# Patient Record
Sex: Female | Born: 1955 | Hispanic: No | Smoking: Former smoker
Health system: Southern US, Community
[De-identification: ages and names within clinical notes are randomized; demographics above are authoritative.]

## PROBLEM LIST (undated history)

## (undated) DIAGNOSIS — Z9889 Other specified postprocedural states: Secondary | ICD-10-CM

## (undated) DIAGNOSIS — H409 Unspecified glaucoma: Secondary | ICD-10-CM

## (undated) DIAGNOSIS — R112 Nausea with vomiting, unspecified: Secondary | ICD-10-CM

## (undated) DIAGNOSIS — I10 Essential (primary) hypertension: Secondary | ICD-10-CM

## (undated) DIAGNOSIS — E119 Type 2 diabetes mellitus without complications: Secondary | ICD-10-CM

## (undated) DIAGNOSIS — T8859XA Other complications of anesthesia, initial encounter: Secondary | ICD-10-CM

## (undated) DIAGNOSIS — C50919 Malignant neoplasm of unspecified site of unspecified female breast: Secondary | ICD-10-CM

## (undated) DIAGNOSIS — Z923 Personal history of irradiation: Secondary | ICD-10-CM

## (undated) HISTORY — PX: GLAUCOMA SURGERY: SHX656

---

## 1999-10-27 ENCOUNTER — Other Ambulatory Visit: Admission: RE | Admit: 1999-10-27 | Discharge: 1999-10-27 | Payer: Self-pay | Admitting: Family Medicine

## 2008-09-30 ENCOUNTER — Encounter: Admission: RE | Admit: 2008-09-30 | Discharge: 2008-09-30 | Payer: Self-pay | Admitting: Internal Medicine

## 2012-01-17 ENCOUNTER — Other Ambulatory Visit (HOSPITAL_COMMUNITY): Payer: Self-pay | Admitting: Nurse Practitioner

## 2012-01-17 DIAGNOSIS — Z139 Encounter for screening, unspecified: Secondary | ICD-10-CM

## 2012-01-20 ENCOUNTER — Ambulatory Visit (HOSPITAL_COMMUNITY)
Admission: RE | Admit: 2012-01-20 | Discharge: 2012-01-20 | Disposition: A | Discharge status: Home / Self Care | Payer: PRIVATE HEALTH INSURANCE | Source: Ambulatory Visit | Attending: Nurse Practitioner | Admitting: Nurse Practitioner

## 2012-01-20 DIAGNOSIS — Z139 Encounter for screening, unspecified: Secondary | ICD-10-CM

## 2012-01-20 DIAGNOSIS — Z1231 Encounter for screening mammogram for malignant neoplasm of breast: Secondary | ICD-10-CM | POA: Insufficient documentation

## 2016-03-18 ENCOUNTER — Emergency Department (HOSPITAL_COMMUNITY): Payer: PRIVATE HEALTH INSURANCE

## 2016-03-18 ENCOUNTER — Emergency Department (HOSPITAL_COMMUNITY)
Admission: EM | Admit: 2016-03-18 | Discharge: 2016-03-18 | Disposition: A | Discharge status: Home / Self Care | Payer: PRIVATE HEALTH INSURANCE | Attending: Emergency Medicine | Admitting: Emergency Medicine

## 2016-03-18 ENCOUNTER — Encounter (HOSPITAL_COMMUNITY): Payer: Self-pay

## 2016-03-18 DIAGNOSIS — X500XXA Overexertion from strenuous movement or load, initial encounter: Secondary | ICD-10-CM | POA: Insufficient documentation

## 2016-03-18 DIAGNOSIS — Z794 Long term (current) use of insulin: Secondary | ICD-10-CM | POA: Insufficient documentation

## 2016-03-18 DIAGNOSIS — Y990 Civilian activity done for income or pay: Secondary | ICD-10-CM | POA: Insufficient documentation

## 2016-03-18 DIAGNOSIS — R1031 Right lower quadrant pain: Secondary | ICD-10-CM | POA: Insufficient documentation

## 2016-03-18 DIAGNOSIS — M545 Low back pain, unspecified: Secondary | ICD-10-CM

## 2016-03-18 DIAGNOSIS — Y9389 Activity, other specified: Secondary | ICD-10-CM | POA: Insufficient documentation

## 2016-03-18 DIAGNOSIS — E119 Type 2 diabetes mellitus without complications: Secondary | ICD-10-CM | POA: Insufficient documentation

## 2016-03-18 DIAGNOSIS — Y929 Unspecified place or not applicable: Secondary | ICD-10-CM | POA: Insufficient documentation

## 2016-03-18 DIAGNOSIS — Z79899 Other long term (current) drug therapy: Secondary | ICD-10-CM | POA: Insufficient documentation

## 2016-03-18 DIAGNOSIS — Z7982 Long term (current) use of aspirin: Secondary | ICD-10-CM | POA: Insufficient documentation

## 2016-03-18 HISTORY — DX: Type 2 diabetes mellitus without complications: E11.9

## 2016-03-18 LAB — COMPREHENSIVE METABOLIC PANEL
ALBUMIN: 3.9 g/dL (ref 3.5–5.0)
ALT: 38 U/L (ref 14–54)
ANION GAP: 5 (ref 5–15)
AST: 21 U/L (ref 15–41)
Alkaline Phosphatase: 80 U/L (ref 38–126)
BUN: 14 mg/dL (ref 6–20)
CHLORIDE: 106 mmol/L (ref 101–111)
CO2: 27 mmol/L (ref 22–32)
Calcium: 9.8 mg/dL (ref 8.9–10.3)
Creatinine, Ser: 0.57 mg/dL (ref 0.44–1.00)
GFR calc Af Amer: >60 mL/min (ref >60)
GFR calc non Af Amer: >60 mL/min (ref >60)
GLUCOSE: 92 mg/dL (ref 65–99)
POTASSIUM: 4.2 mmol/L (ref 3.5–5.1)
SODIUM: 138 mmol/L (ref 135–145)
TOTAL PROTEIN: 6.9 g/dL (ref 6.5–8.1)
Total Bilirubin: 0.6 mg/dL (ref 0.3–1.2)

## 2016-03-18 LAB — URINALYSIS, ROUTINE W REFLEX MICROSCOPIC
BILIRUBIN URINE: NEGATIVE
Glucose, UA: NEGATIVE mg/dL
Hgb urine dipstick: NEGATIVE
Ketones, ur: NEGATIVE mg/dL
Leukocytes, UA: NEGATIVE
NITRITE: NEGATIVE
PH: 6 (ref 5.0–8.0)
Protein, ur: NEGATIVE mg/dL
SPECIFIC GRAVITY, URINE: 1.026 (ref 1.005–1.030)

## 2016-03-18 LAB — CBC
HEMATOCRIT: 40.3 % (ref 36.0–46.0)
HEMOGLOBIN: 13.7 g/dL (ref 12.0–15.0)
MCH: 29.5 pg (ref 26.0–34.0)
MCHC: 34 g/dL (ref 30.0–36.0)
MCV: 86.9 fL (ref 78.0–100.0)
Platelets: 318 10*3/uL (ref 150–400)
RBC: 4.64 MIL/uL (ref 3.87–5.11)
RDW: 12.9 % (ref 11.5–15.5)
WBC: 12 10*3/uL — ABNORMAL HIGH (ref 4.0–10.5)

## 2016-03-18 LAB — LIPASE, BLOOD: LIPASE: 22 U/L (ref 11–51)

## 2016-03-18 MED ORDER — ONDANSETRON HCL 4 MG/2ML IJ SOLN
4.0000 mg | Freq: Once | INTRAMUSCULAR | Status: AC
  Administered 2016-03-18: 4 mg via INTRAVENOUS
  Filled 2016-03-18: qty 2

## 2016-03-18 MED ORDER — IOPAMIDOL (ISOVUE-300) INJECTION 61%
100.0000 mL | Freq: Once | INTRAVENOUS | Status: AC | PRN
  Administered 2016-03-18: 100 mL via INTRAVENOUS

## 2016-03-18 MED ORDER — IBUPROFEN 600 MG PO TABS: 600.0000 mg | ORAL_TABLET | Freq: Four times a day (QID) | ORAL | 0 refills | Status: DC | PRN

## 2016-03-18 MED ORDER — HYDROCODONE-ACETAMINOPHEN 5-325 MG PO TABS: 1.0000 | ORAL_TABLET | ORAL | 0 refills | Status: DC | PRN

## 2016-03-18 MED ORDER — MORPHINE SULFATE (PF) 4 MG/ML IV SOLN
4.0000 mg | Freq: Once | INTRAVENOUS | Status: AC
  Administered 2016-03-18: 4 mg via INTRAVENOUS
  Filled 2016-03-18: qty 1

## 2016-03-18 NOTE — Discharge Instructions (Signed)
Your CT overall does not show serious abdominal cause of your pain. You do have nodules in the adrenal glands and liver that will require MRI. Please let your primary care doctor know about this, so that it can be scheduled as an outpatient. Your CT results are below.  Your pain is likely from heavy lifting and back strain. Take ibuprofen, use Ice pack or heat packs. Return for inability to walk, leg numbness/weakness, escalating pain or any other symptoms concerning to you. Avoid any heavy lifting.   CT abdomen/pelvis results: IMPRESSION: 1. No acute process of abdomen or pelvis is identified as explanation for pain. 2. Malpositioned intrauterine device with the long process and 1 of short processes penetrating through the myometrium. 3. Segment 6, 1.9 cm nonspecific enhancing liver lesion, probably corresponding to 1.6 cm lesion identified 2010 abdominal ultrasound. Liver MRI is recommended for further characterization on a nonemergent basis given the possibility of interval growth. 4. Bilateral adrenal nodules measuring up to 30 mm with indeterminate imaging features. Further assessment with noncontrast adrenal CT or adrenal MRI with the liver MRI is recommended. This recommendation follows ACR consensus guidelines: Management of Incidental Adrenal Masses: A White Paper of the ACR Incidental Findings Committee. J Am Coll Radiol 2017;14:1038-1044. These results will be called to the ordering clinician or representative by the Radiologist Assistant, and communication documented in the PACS or zVision Dashboard.

## 2016-03-18 NOTE — ED Notes (Signed)
Discharge instructions, follow up care, and rx x2 reviewed with patient. Patient verbalized understanding. 

## 2016-03-18 NOTE — ED Triage Notes (Addendum)
PT C/O RIGHT LOWER BACK PAIN RADIATING TO THE RLQ X2 DAYS. PT STS THE PAIN STARTED 2 NIGHTS AGO AFTER LEAVING WORK. PT STS SHE WAS DOING HEAVY LIFTING. DENIES URINARY SYMPTOMS, NUMBNESS OR TINGLING. PT DESCRIBES THE PAIN AS BURNING.

## 2016-03-18 NOTE — ED Provider Notes (Signed)
[facility] DEPT Provider Note   CSN: [id number] Arrival date & time: 03/18/16  1204     History   Chief Complaint Chief Complaint  Patient presents with  . Back Pain    RL    HPI Kelli Vaughn is a 60 y.o. female.  HPI 60 year old female who presents with right low back pain and right sided abdominal pain x 2 days. History of Type II DM and CS x 2. States heavy lifting of 20 lbs boxes 2 days ago, and later on that evening had pain in her abdomen and right low back. Pain sharp and burning. With nausea and vomiting x 1 today, but no fever or chills, diarrhea, constipation or urinary complaints such as dysuria/hematuria/frequency. Worse with movement, walking, sitting. Not associated with eating, although has not had appetite to eat over past 1 day. Took 2 ibuprofen this morning, no relief. No LE weakness, loss of control of bowel or urine, or numbness.  Past Medical History:  Diagnosis Date  . Diabetes mellitus without complication ([place])    TYPE II    There are no active problems to display for this patient.   Past Surgical History:  Procedure Laterality Date  . CESAREAN SECTION     X2    OB History    No data available       Home Medications    Prior to Admission medications   Medication Sig Start Date End Date Taking? Authorizing Provider  aspirin EC 81 MG tablet Take 81 mg by mouth daily.   Yes Historical Provider, MD  ergocalciferol (VITAMIN D2) 50000 units capsule Take 50,000 Units by mouth once a week.   Yes Historical Provider, MD  ibuprofen (ADVIL,MOTRIN) 800 MG tablet Take 1,600 mg by mouth every 8 (eight) hours as needed for moderate pain.   Yes Historical Provider, MD  insulin glargine (LANTUS) 100 UNIT/ML injection Inject 35 Units into the skin 2 (two) times daily.   Yes Historical Provider, MD  lisinopril (PRINIVIL,ZESTRIL) 20 MG tablet Take 20 mg by mouth daily.   Yes Historical Provider, MD  Menthol-Camphor (TIGER BALM ARTHRITIS RUB EX)  Apply 1 application topically.   Yes Historical Provider, MD  PRESCRIPTION MEDICATION Place 1 drop into both eyes 2 (two) times daily. Morning and noon   Yes Historical Provider, MD  PRESCRIPTION MEDICATION Place 1 drop into both eyes at bedtime.   Yes Historical Provider, MD  sitaGLIPtin-metformin (JANUMET) 50-1000 MG tablet Take 1 tablet by mouth 2 (two) times daily with a meal.   Yes Historical Provider, MD  HYDROcodone-acetaminophen (NORCO/VICODIN) 5-325 MG tablet Take 1 tablet by mouth every 4 (four) hours as needed for severe pain. 03/18/16   Forde Dandy, MD  ibuprofen (ADVIL,MOTRIN) 600 MG tablet Take 1 tablet (600 mg total) by mouth every 6 (six) hours as needed. 03/18/16   Forde Dandy, MD    Family History History reviewed. No pertinent family history.  Social History Social History  Substance Use Topics  . Smoking status: Never Smoker  . Smokeless tobacco: Never Used  . Alcohol use No     Allergies   Review of patient's allergies indicates no known allergies.   Review of Systems Review of Systems 10/14 systems reviewed and are negative other than those stated in the HPI   Physical Exam Updated Vital Signs BP 144/63 (BP Location: Left Arm)   Pulse 64   Temp 98.2 F (36.8 C) (Oral)   Resp 18   Ht  5\' 4"  (1.626 m)   Wt 180 lb (81.6 kg)   SpO2 99%   BMI 30.90 kg/m   Physical Exam Physical Exam  Nursing note and vitals reviewed. Constitutional: Non-toxic, and in no acute distress but appears uncomfortable Head: Normocephalic and atraumatic.  Mouth/Throat: Oropharynx is clear and moist.  Neck: Normal range of motion. Neck supple.  Cardiovascular: Normal rate and regular rhythm.   Pulmonary/Chest: Effort normal and breath sounds normal.  Abdominal: Soft. There is right sided abdominal tenderness, worst in the RLQ. There is no rebound and no guarding. There is right flank tenderness to palpation and percussion. Musculoskeletal: Normal range of motion.    Neurological: Alert, no facial droop, fluent speech, moves all extremities symmetrically, normal gait, sensation to light touch in tact throughout Skin: Skin is warm and dry.  Psychiatric: Cooperative   ED Treatments / Results  Labs (all labs ordered are listed, but only abnormal results are displayed) Labs Reviewed  CBC - Abnormal; Notable for the following:       Result Value   WBC 12.0 (*)    All other components within normal limits  LIPASE, BLOOD  COMPREHENSIVE METABOLIC PANEL  URINALYSIS, ROUTINE W REFLEX MICROSCOPIC (NOT AT [facility])    EKG  EKG Interpretation None       Radiology Ct Abdomen Pelvis W Contrast  Result Date: 03/18/2016 CLINICAL DATA:  60 y/o F; right lower back pain and right-sided abdominal pain for 2 days. One day of nausea and vomiting. EXAM: CT ABDOMEN AND PELVIS WITH CONTRAST TECHNIQUE: Multidetector CT imaging of the abdomen and pelvis was performed using the standard protocol following bolus administration of intravenous contrast. CONTRAST:  184mL ISOVUE-300 IOPAMIDOL (ISOVUE-300) INJECTION 61% COMPARISON:  09/30/2008 abdominal ultrasound. FINDINGS: Lower chest: Mild cardiomegaly. Mitral annular calcifications. Minor bibasilar atelectasis. Hepatobiliary: 1.9 cm enhancing lesion within liver segment 6 (series 2, image 21 and series 3, image 70) probably corresponding to 1.6 cm lesion on prior ultrasound, possibly hemangioma or adenoma. Hepatic steatosis. Normal gallbladder. No biliary ductal dilatation. Patent portal venous system. Pancreas: Unremarkable. No pancreatic ductal dilatation or surrounding inflammatory changes. Spleen: Normal in size without focal abnormality. Adrenals/Urinary Tract: 20 mm right adrenal nodule, 30 x 20 mm left adrenal nodule and a second 14 mm left adrenal nodule. Kidneys are normal, without renal calculi, focal lesion, or hydronephrosis. Bladder is unremarkable. Left kidney lower pole 6 mm lucency without delayed enhancement, probably  a cyst. Stomach/Bowel: Stomach is within normal limits. No evidence of bowel wall thickening, distention, or inflammatory changes. Appendix is not visualized. Vascular/Lymphatic: No significant vascular findings are present. No enlarged abdominal or pelvic lymph nodes. Reproductive: There is an IUD present that appears malpositioned with the long process of the IUD and 1 of the short processes of the IUD penetrating the myometrium into surrounding fat (series 3 image 83 and 89). Bilateral adnexa are unremarkable. 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 No abdominopelvic ascites. Musculoskeletal: Multilevel degenerative changes of the thoracolumbar spine and small Schmorl's nodes with disc space narrowing, marginal osteophytes, and mild lower lumbar facet arthrosis. No acute fracture or malalignment is identified. IMPRESSION: 1. No acute process of abdomen or pelvis is identified as explanation for pain. 2. Malpositioned intrauterine device with the long process and 1 of short processes penetrating through the myometrium. 3. Segment 6, 1.9 cm nonspecific enhancing liver lesion, probably corresponding to 1.6 cm lesion identified 2010 abdominal  ultrasound. Liver MRI is recommended for further characterization on a nonemergent basis given the possibility of interval growth. 4. Bilateral adrenal nodules measuring up to 30 mm with indeterminate imaging features. Further assessment with noncontrast adrenal CT or adrenal MRI with the liver MRI is recommended. This recommendation follows ACR consensus guidelines: Management of Incidental Adrenal Masses: A White Paper of the ACR Incidental Findings Committee. J Am Coll Radiol 2017;14:1038-1044. These results will be called to the ordering clinician or representative by the Radiologist Assistant, and communication documented in the PACS or zVision Dashboard. Electronically  Signed   By: Kristine Garbe M.D.   On: 03/18/2016 18:01    Procedures Procedures (including critical care time)  Medications Ordered in ED Medications  morphine 4 MG/ML injection 4 mg (4 mg Intravenous Given 03/18/16 1644)  ondansetron (ZOFRAN) injection 4 mg (4 mg Intravenous Given 03/18/16 1644)  iopamidol (ISOVUE-300) 61 % injection 100 mL (100 mLs Intravenous Contrast Given 03/18/16 1719)     Initial Impression / Assessment and Plan / ED Course  I have reviewed the triage vital signs and the nursing notes.  Pertinent labs & imaging results that were available during my care of the patient were reviewed by me and considered in my medical decision making (see chart for details).  Clinical Course    Presentation consistent with likely back strain from lifting 20 found boxes at work just prior to onset of pain. She is afebrile hemodynamically stable. Pain with palpation of the paraspinal muscles of the right lower back but also significant right-sided abdominal pain. Given the nausea and vomiting I did perform CT abdomen and pelvis. This shows no acute intra-abdominal processes. Basic blood work and urinalysis are also overall unremarkable. I did initially receive anti-emetics analgesics in the ED with significant relief in symptoms. I discussed supportive care instructions for home for likely back sprain. Strict return and follow-up instructions reviewed. She expressed understanding of all discharge instructions and felt comfortable with the plan of care.   Final Clinical Impressions(s) / ED Diagnoses   Final diagnoses:  Right-sided low back pain without sciatica    New Prescriptions New Prescriptions   HYDROCODONE-ACETAMINOPHEN (NORCO/VICODIN) 5-325 MG TABLET    Take 1 tablet by mouth every 4 (four) hours as needed for severe pain.   IBUPROFEN (ADVIL,MOTRIN) 600 MG TABLET    Take 1 tablet (600 mg total) by mouth every 6 (six) hours as needed.     Forde Dandy, MD 03/18/16  [phone]

## 2016-03-18 NOTE — ED Notes (Signed)
Patient refusing interpreter. Patient requesting for family member to translate. RN encouraged patient to use translator, however, patient refused.

## 2017-07-11 HISTORY — PX: EYE SURGERY: SHX253

## 2018-05-22 ENCOUNTER — Emergency Department (HOSPITAL_COMMUNITY)
Admission: EM | Admit: 2018-05-22 | Discharge: 2018-05-22 | Disposition: A | Discharge status: Home / Self Care | Payer: Self-pay | Attending: Emergency Medicine | Admitting: Emergency Medicine

## 2018-05-22 ENCOUNTER — Other Ambulatory Visit: Payer: Self-pay

## 2018-05-22 ENCOUNTER — Encounter (HOSPITAL_COMMUNITY): Payer: Self-pay | Admitting: Emergency Medicine

## 2018-05-22 ENCOUNTER — Emergency Department (HOSPITAL_COMMUNITY): Payer: Self-pay

## 2018-05-22 DIAGNOSIS — Z794 Long term (current) use of insulin: Secondary | ICD-10-CM | POA: Insufficient documentation

## 2018-05-22 DIAGNOSIS — R531 Weakness: Secondary | ICD-10-CM | POA: Insufficient documentation

## 2018-05-22 DIAGNOSIS — R410 Disorientation, unspecified: Secondary | ICD-10-CM | POA: Insufficient documentation

## 2018-05-22 DIAGNOSIS — Z79899 Other long term (current) drug therapy: Secondary | ICD-10-CM | POA: Insufficient documentation

## 2018-05-22 DIAGNOSIS — E119 Type 2 diabetes mellitus without complications: Secondary | ICD-10-CM | POA: Insufficient documentation

## 2018-05-22 DIAGNOSIS — Z7982 Long term (current) use of aspirin: Secondary | ICD-10-CM | POA: Insufficient documentation

## 2018-05-22 DIAGNOSIS — R202 Paresthesia of skin: Secondary | ICD-10-CM | POA: Insufficient documentation

## 2018-05-22 DIAGNOSIS — I10 Essential (primary) hypertension: Secondary | ICD-10-CM | POA: Insufficient documentation

## 2018-05-22 HISTORY — DX: Essential (primary) hypertension: I10

## 2018-05-22 HISTORY — DX: Unspecified glaucoma: H40.9

## 2018-05-22 LAB — PROTIME-INR
INR: 0.87
Prothrombin Time: 11.8 seconds (ref 11.4–15.2)

## 2018-05-22 LAB — DIFFERENTIAL
Abs Immature Granulocytes: 0.12 10*3/uL — ABNORMAL HIGH (ref 0.00–0.07)
BASOS ABS: 0.1 10*3/uL (ref 0.0–0.1)
BASOS PCT: 1 %
EOS PCT: 2 %
Eosinophils Absolute: 0.4 10*3/uL (ref 0.0–0.5)
Immature Granulocytes: 1 %
Lymphocytes Relative: 14 %
Lymphs Abs: 2.7 10*3/uL (ref 0.7–4.0)
MONO ABS: 1.1 10*3/uL — AB (ref 0.1–1.0)
Monocytes Relative: 6 %
NEUTROS PCT: 76 %
Neutro Abs: 14.1 10*3/uL — ABNORMAL HIGH (ref 1.7–7.7)

## 2018-05-22 LAB — COMPREHENSIVE METABOLIC PANEL
ALK PHOS: 109 U/L (ref 38–126)
ALT: 47 U/L — ABNORMAL HIGH (ref 0–44)
ANION GAP: 8 (ref 5–15)
AST: 33 U/L (ref 15–41)
Albumin: 4.2 g/dL (ref 3.5–5.0)
BUN: 12 mg/dL (ref 6–20)
CALCIUM: 10 mg/dL (ref 8.9–10.3)
CHLORIDE: 105 mmol/L (ref 98–111)
CO2: 24 mmol/L (ref 22–32)
Creatinine, Ser: 0.44 mg/dL (ref 0.44–1.00)
GFR calc non Af Amer: >60 mL/min (ref >60)
Glucose, Bld: 62 mg/dL — ABNORMAL LOW (ref 70–99)
Potassium: 3.8 mmol/L (ref 3.5–5.1)
SODIUM: 137 mmol/L (ref 135–145)
Total Bilirubin: 0.6 mg/dL (ref 0.3–1.2)
Total Protein: 7.8 g/dL (ref 6.5–8.1)

## 2018-05-22 LAB — CBC
HCT: 45.5 % (ref 36.0–46.0)
Hemoglobin: 14.4 g/dL (ref 12.0–15.0)
MCH: 28.3 pg (ref 26.0–34.0)
MCHC: 31.6 g/dL (ref 30.0–36.0)
MCV: 89.6 fL (ref 80.0–100.0)
PLATELETS: 319 10*3/uL (ref 150–400)
RBC: 5.08 MIL/uL (ref 3.87–5.11)
RDW: 13 % (ref 11.5–15.5)
WBC: 18.4 10*3/uL — AB (ref 4.0–10.5)
nRBC: 0 % (ref 0.0–0.2)

## 2018-05-22 LAB — I-STAT TROPONIN, ED: Troponin i, poc: 0 ng/mL (ref 0.00–0.08)

## 2018-05-22 LAB — RAPID URINE DRUG SCREEN, HOSP PERFORMED
AMPHETAMINES: NOT DETECTED
Barbiturates: NOT DETECTED
Benzodiazepines: NOT DETECTED
Cocaine: NOT DETECTED
OPIATES: NOT DETECTED
Tetrahydrocannabinol: NOT DETECTED

## 2018-05-22 LAB — URINALYSIS, ROUTINE W REFLEX MICROSCOPIC
BILIRUBIN URINE: NEGATIVE
Glucose, UA: NEGATIVE mg/dL
Hgb urine dipstick: NEGATIVE
Ketones, ur: 5 mg/dL — AB
LEUKOCYTES UA: NEGATIVE
NITRITE: NEGATIVE
PH: 7 (ref 5.0–8.0)
Protein, ur: NEGATIVE mg/dL
Specific Gravity, Urine: 1.009 (ref 1.005–1.030)

## 2018-05-22 LAB — CBG MONITORING, ED: Glucose-Capillary: 53 mg/dL — ABNORMAL LOW (ref 70–99)

## 2018-05-22 LAB — APTT: APTT: 31 s (ref 24–36)

## 2018-05-22 MED ORDER — SODIUM CHLORIDE 0.9 % IV BOLUS
500.0000 mL | Freq: Once | INTRAVENOUS | Status: AC
  Administered 2018-05-22: 500 mL via INTRAVENOUS

## 2018-05-22 MED ORDER — SODIUM CHLORIDE 0.9 % IV SOLN
100.0000 mL/h | INTRAVENOUS | Status: DC
  Administered 2018-05-22: 100 mL/h via INTRAVENOUS

## 2018-05-22 NOTE — Discharge Instructions (Addendum)
As discussed, your evaluation today has been largely reassuring.  But, it is important that you monitor your condition carefully, and do not hesitate to return to the ED if you develop new, or concerning changes in your condition. ? ?Otherwise, please follow-up with your physician for appropriate ongoing care. ? ?

## 2018-05-22 NOTE — ED Provider Notes (Signed)
[facility] EMERGENCY DEPARTMENT Provider Note   CSN: [id number] Arrival date & time: 05/22/18  1709     History   Chief Complaint Chief Complaint  Patient presents with  . Altered Mental Status    HPI Kelli Vaughn is a 62 y.o. female.  HPI  Patient presents with her son who provides much the HPI. Patient speaks some English only. Son notes that about 2 hours prior to ED arrival the patient seemed more confused, repetitive, without speech difficulty, without weakness, without syncope. The patient herself states that she feels anxious, but denies weakness, denies pain, denies confusion. She is anxious about being here. On review of systems, it seems as the patient has some nausea, mild upset stomach, but she attributes this to being hungry. She also has some sore throat, but denies pain. Reportedly the patient was in her usual state of health until onset of symptoms 2 hours ago. She reportedly takes all medication as directed including antihypertensives and anti-hyperglycemics.   Past Medical History:  Diagnosis Date  . Diabetes mellitus without complication ([place])    TYPE II  . Glaucoma   . Hypertension     There are no active problems to display for this patient.   Past Surgical History:  Procedure Laterality Date  . CESAREAN SECTION     X2  . GLAUCOMA SURGERY       OB History   None      Home Medications    Prior to Admission medications   Medication Sig Start Date End Date Taking? Authorizing Provider  aspirin EC 81 MG tablet Take 81 mg by mouth daily.   Yes [provider]  COMBIGAN 0.2-0.5 % ophthalmic solution Apply 1 drop to eye every 12 (twelve) hours.  04/10/18  Yes [provider]  glipiZIDE (GLUCOTROL) 5 MG tablet Take 5 mg by mouth daily.    Yes [provider]  ibuprofen (ADVIL,MOTRIN) 800 MG tablet Take 1,600 mg by mouth every 8 (eight) hours as needed for moderate pain.   Yes [provider]    insulin glargine (LANTUS) 100 UNIT/ML injection Inject 70 Units into the skin every morning.    Yes [provider]  lisinopril (PRINIVIL,ZESTRIL) 20 MG tablet Take 20 mg by mouth daily.   Yes [provider]  LUMIGAN 0.01 % SOLN Place 1 drop into both eyes at bedtime.  04/09/18  Yes [provider]  propranolol (INDERAL) 40 MG tablet Take 40 mg by mouth 2 (two) times daily.   Yes [provider]  ranitidine (ZANTAC) 300 MG capsule Take 300 mg by mouth daily.   Yes [provider]  sitaGLIPtin-metformin (JANUMET) 50-1000 MG tablet Take 1 tablet by mouth 2 (two) times daily with a meal.   Yes [provider]    Family History History reviewed. No pertinent family history.  Social History Social History   Tobacco Use  . Smoking status: Never Smoker  . Smokeless tobacco: Never Used  Substance Use Topics  . Alcohol use: No  . Drug use: No     Allergies   Patient has no known allergies.   Review of Systems Review of Systems  Constitutional:       Per HPI, otherwise negative  HENT:       Per HPI, otherwise negative  Respiratory:       Per HPI, otherwise negative  Cardiovascular:       Per HPI, otherwise negative  Gastrointestinal: Negative for vomiting.  Endocrine:  Negative aside from HPI  Genitourinary:       Neg aside from HPI   Musculoskeletal:       Per HPI, otherwise negative  Skin: Negative.   Neurological: Negative for syncope.  Psychiatric/Behavioral: Positive for confusion.     Physical Exam Updated Vital Signs BP (!) 142/52   Pulse 78   Resp (!) 23   Ht 5' (1.524 m)   Wt 88.5 kg   SpO2 99%   BMI 38.08 kg/m   Physical Exam  Constitutional: She is oriented to person, place, and time. She appears well-developed and well-nourished. No distress.  HENT:  Head: Normocephalic and atraumatic.  Eyes: Conjunctivae and EOM are normal.  Cardiovascular: Normal rate and regular rhythm.   Pulmonary/Chest: Effort normal and breath sounds normal. No stridor. No respiratory distress.  Abdominal: She exhibits no distension. There is no tenderness. There is no guarding.  Musculoskeletal: She exhibits no edema.  Neurological: She is alert and oriented to person, place, and time. She displays no atrophy and no tremor. No cranial nerve deficit. She exhibits normal muscle tone. She displays no seizure activity. Coordination normal.  Skin: Skin is warm and dry.  Psychiatric: Her mood appears anxious.  Nursing note and vitals reviewed.    ED Treatments / Results  Labs (all labs ordered are listed, but only abnormal results are displayed) Labs Reviewed  CBC - Abnormal; Notable for the following components:      Result Value   WBC 18.4 (*)    All other components within normal limits  DIFFERENTIAL - Abnormal; Notable for the following components:   Neutro Abs 14.1 (*)    Monocytes Absolute 1.1 (*)    Abs Immature Granulocytes 0.12 (*)    All other components within normal limits  COMPREHENSIVE METABOLIC PANEL - Abnormal; Notable for the following components:   Glucose, Bld 62 (*)    ALT 47 (*)    All other components within normal limits  URINALYSIS, ROUTINE W REFLEX MICROSCOPIC - Abnormal; Notable for the following components:   Color, Urine STRAW (*)    Ketones, ur 5 (*)    All other components within normal limits  CBG MONITORING, ED - Abnormal; Notable for the following components:   Glucose-Capillary 53 (*)    All other components within normal limits  PROTIME-INR  APTT  RAPID URINE DRUG SCREEN, HOSP PERFORMED  I-STAT TROPONIN, ED    EKG EKG Interpretation  Date/Time:  Tuesday May 22 2018 17:23:54 EST Ventricular Rate:  81 PR Interval:    QRS Duration: 86 QT Interval:  374 QTC Calculation: 435 R Axis:   18 Text Interpretation:  Sinus rhythm Borderline repolarization abnormality T wave abnormality Artifact Abnormal ekg Confirmed by Carmin Muskrat  [phone]) on 05/22/2018 5:28:01 PM   Radiology Dg Chest 2 View  Result Date: 05/22/2018 CLINICAL DATA:  Numbness in both hands and tongue. Confusion. Leukocytosis. EXAM: CHEST - 2 VIEW COMPARISON:  None. FINDINGS: The heart size and mediastinal contours are within normal limits. Prominent perihilar interstitial lung markings may reflect bronchitic change. No alveolar consolidation or CHF. No effusion or pneumothorax. Degenerative changes are present about the [facility] and glenohumeral joints. Thoracic spondylosis. IMPRESSION: Mild prominence of perihilar interstitial lung markings may reflect bronchitic change. No acute pulmonary consolidations or CHF. Electronically Signed   By: Ashley Royalty M.D.   On: 05/22/2018 19:58   Mr Brain Wo Contrast  Result Date: 05/22/2018 CLINICAL DATA:  Bilateral hand numbness.  Confusion. EXAM: MRI HEAD WITHOUT  CONTRAST TECHNIQUE: Multiplanar, multiecho pulse sequences of the brain and surrounding structures were obtained without intravenous contrast. COMPARISON:  None. FINDINGS: BRAIN: There is no acute infarct, acute hemorrhage, hydrocephalus or extra-axial collection. The midline structures are normal. No midline shift or other mass effect. There are no old infarcts. The white matter signal is normal for the patient's age. The cerebral and cerebellar volume are age-appropriate. Susceptibility-sensitive sequences show no chronic microhemorrhage or superficial siderosis. VASCULAR: Major intracranial arterial and venous sinus flow voids are normal. SKULL AND UPPER CERVICAL SPINE: Calvarial bone marrow signal is normal. There is no skull base mass. Visualized upper cervical spine and soft tissues are normal. SINUSES/ORBITS: No fluid levels or advanced mucosal thickening. No mastoid or middle ear effusion. The orbits are normal. IMPRESSION: Normal brain MRI. Electronically Signed   By: Ulyses Jarred M.D.   On: 05/22/2018 18:17    Procedures Procedures (including critical care  time)  Medications Ordered in ED Medications  sodium chloride 0.9 % bolus 500 mL (500 mLs Intravenous New Bag/Given 05/22/18 1831)    Followed by  0.9 %  sodium chloride infusion (100 mL/hr Intravenous New Bag/Given 05/22/18 1831)     Initial Impression / Assessment and Plan / ED Course  I have reviewed the triage vital signs and the nursing notes.  Pertinent labs & imaging results that were available during my care of the patient were reviewed by me and considered in my medical decision making (see chart for details).  Update: Patient feels better, has received fluids.   9:45 PM  Smiling, is awake, alert, with no ongoing complaints per Discussed all findings at length, several times with patient and 2 female companions. Remaining labs notable for mild ketonuria, suggesting possible dehydration, likely contributing the patient's discomfort, no evidence for infection, no evidence for stroke, and after chart review, with prior documentation of migraines, there is some suspicion for complex migraine, but no evidence for new intracranial pathology, distress, and with improvement here she was discharged in stable condition to follow-up with primary care.  Final Clinical Impressions(s) / ED Diagnoses  Confusion Weakness   Carmin Muskrat, MD 05/22/18 2145

## 2018-05-22 NOTE — ED Notes (Signed)
Pt went to the restroom without getting [place] a urine sample. Will get urine sample when able to

## 2018-05-22 NOTE — ED Notes (Signed)
Pt to MRI. Will administer fluids once she returns

## 2018-05-22 NOTE — ED Triage Notes (Signed)
Patient brought in by son, son states patient started having numbness to bilateral hands and tongue and confusion at approximately 1500 today. Dr Vanita Panda at bedside. Patient complaining of pressure to throat and abdomen.

## 2019-11-15 ENCOUNTER — Telehealth: Payer: Self-pay

## 2019-11-15 NOTE — Telephone Encounter (Signed)
Telephoned patient at home number using interpreter line [phone]). Left voice message with BCCCP contact information.

## 2019-11-20 ENCOUNTER — Other Ambulatory Visit: Payer: Self-pay

## 2019-11-20 DIAGNOSIS — Z1231 Encounter for screening mammogram for malignant neoplasm of breast: Secondary | ICD-10-CM

## 2019-11-25 ENCOUNTER — Other Ambulatory Visit: Payer: Self-pay | Admitting: Obstetrics and Gynecology

## 2019-11-25 DIAGNOSIS — Z1231 Encounter for screening mammogram for malignant neoplasm of breast: Secondary | ICD-10-CM

## 2019-12-12 ENCOUNTER — Ambulatory Visit: Payer: Self-pay | Admitting: *Deleted

## 2019-12-12 ENCOUNTER — Other Ambulatory Visit: Payer: Self-pay

## 2019-12-12 ENCOUNTER — Ambulatory Visit
Admission: RE | Admit: 2019-12-12 | Discharge: 2019-12-12 | Disposition: A | Discharge status: Home / Self Care | Payer: No Typology Code available for payment source | Source: Ambulatory Visit | Attending: Obstetrics and Gynecology | Admitting: Obstetrics and Gynecology

## 2019-12-12 VITALS — BP 130/64 | Temp 97.3°F | Wt 196.3 lb

## 2019-12-12 DIAGNOSIS — Z1231 Encounter for screening mammogram for malignant neoplasm of breast: Secondary | ICD-10-CM

## 2019-12-12 DIAGNOSIS — Z1239 Encounter for other screening for malignant neoplasm of breast: Secondary | ICD-10-CM

## 2019-12-12 NOTE — Progress Notes (Signed)
[name] is a 64 y.o. female who presents to [facility] clinic today with no complaints.   Pap Smear: Pap smear not completed today. Last Pap smear was in 2019 at [facility] clinic in [place] and was normal per patient. Per patient has no history of an abnormal Pap smear. No Pap smear results are in Epic.   Physical exam: Breasts Breasts symmetrical. No skin abnormalities left breast. Observed a scar on the right upper outer quadrant of right breast that patient is unsure what happened. No nipple retraction bilateral breasts. No nipple discharge bilateral breasts. No lymphadenopathy. No lumps palpated bilateral breasts. No complaints of pain or tenderness on exam.      Pelvic/Bimanual Pap is not indicated today per BCCCP guidelines.   Smoking History: Patient has never smoked.   Patient Navigation: Patient education provided. Access to services provided for patient through [place] program. Spanish interpreter Rudene Anda from [facility] provided.   Colorectal Cancer Screening: Per patient had a colonoscopy completed in 2010. No complaints today.    Breast and Cervical Cancer Risk Assessment: Patient has a family history of her sister having breast cancer. Patient has no known genetic mutations or history of radiation treatment to the chest before age 54. Patient has no history of cervical dysplasia, immunocompromised, or DES exposure in-utero.  Risk Assessment    Risk Scores      12/12/2019   Last edited by: Loletta Parish, RN   5-year risk: 1.9 %   Lifetime risk: 9.8 %          A: BCCCP exam without pap smear   P: Referred patient to the [facility] for a screening mammogram. Appointment scheduled on the mobile unit Thursday, December 12, 2019 at 0930.  Loletta Parish, RN 12/12/2019 8:35 AM

## 2019-12-12 NOTE — Patient Instructions (Addendum)
Explained breast self awareness with Rosalinda Scherry Ran. Patient did not need a Pap smear today due to last Pap smear was in 2019 per patient. Let her know [facility] will cover Pap smears every 3 years unless has a history of abnormal Pap smears. Referred patient to the [facility] for a screening mammogram. Appointment scheduled on the mobile unit Thursday, December 12, 2019 at 0930. Patient escorted to the mobile mammogram unit. Let patient know the [facility] will follow-up with her within the next couple of weeks with the results of her mammogram by letter or phone. Magnet verbalized understanding.  Cordell Guercio, Arvil Chaco, RN 9:04 AM

## 2019-12-16 ENCOUNTER — Other Ambulatory Visit: Payer: Self-pay | Admitting: Obstetrics and Gynecology

## 2019-12-16 DIAGNOSIS — R928 Other abnormal and inconclusive findings on diagnostic imaging of breast: Secondary | ICD-10-CM

## 2019-12-25 ENCOUNTER — Ambulatory Visit
Admission: RE | Admit: 2019-12-25 | Discharge: 2019-12-25 | Disposition: A | Discharge status: Home / Self Care | Payer: No Typology Code available for payment source | Source: Ambulatory Visit | Attending: Obstetrics and Gynecology | Admitting: Obstetrics and Gynecology

## 2019-12-25 ENCOUNTER — Other Ambulatory Visit: Payer: Self-pay | Admitting: Obstetrics and Gynecology

## 2019-12-25 ENCOUNTER — Other Ambulatory Visit: Payer: Self-pay

## 2019-12-25 DIAGNOSIS — R928 Other abnormal and inconclusive findings on diagnostic imaging of breast: Secondary | ICD-10-CM

## 2020-01-01 ENCOUNTER — Other Ambulatory Visit: Payer: No Typology Code available for payment source

## 2020-01-06 ENCOUNTER — Other Ambulatory Visit: Payer: No Typology Code available for payment source

## 2020-01-06 ENCOUNTER — Inpatient Hospital Stay: Admission: RE | Admit: 2020-01-06 | Payer: No Typology Code available for payment source | Source: Ambulatory Visit

## 2020-01-17 ENCOUNTER — Telehealth: Payer: Self-pay | Admitting: *Deleted

## 2020-01-17 NOTE — Telephone Encounter (Signed)
Called patient with Spanish interpreter Benjamine Sprague to remind her to schedule her breast biopsy and the importance of following up. Patient stated she would like a second opinion before scheduling biopsy. Told patient that [facility] will not cover the second opinion. Patient verbalized understanding.

## 2021-08-09 ENCOUNTER — Other Ambulatory Visit: Payer: Self-pay | Admitting: Pediatrics

## 2021-08-09 DIAGNOSIS — R928 Other abnormal and inconclusive findings on diagnostic imaging of breast: Secondary | ICD-10-CM

## 2021-08-12 ENCOUNTER — Other Ambulatory Visit: Payer: Self-pay

## 2021-08-12 ENCOUNTER — Ambulatory Visit: Payer: Self-pay | Admitting: *Deleted

## 2021-08-12 ENCOUNTER — Ambulatory Visit
Admission: RE | Admit: 2021-08-12 | Discharge: 2021-08-12 | Disposition: A | Discharge status: Home / Self Care | Payer: No Typology Code available for payment source | Source: Ambulatory Visit | Attending: Pediatrics | Admitting: Pediatrics

## 2021-08-12 ENCOUNTER — Other Ambulatory Visit: Payer: Self-pay | Admitting: Pediatrics

## 2021-08-12 ENCOUNTER — Encounter (INDEPENDENT_AMBULATORY_CARE_PROVIDER_SITE_OTHER): Payer: Self-pay

## 2021-08-12 VITALS — BP 138/60 | Wt 197.1 lb

## 2021-08-12 DIAGNOSIS — R928 Other abnormal and inconclusive findings on diagnostic imaging of breast: Secondary | ICD-10-CM

## 2021-08-12 DIAGNOSIS — Z1239 Encounter for other screening for malignant neoplasm of breast: Secondary | ICD-10-CM

## 2021-08-12 DIAGNOSIS — Z1211 Encounter for screening for malignant neoplasm of colon: Secondary | ICD-10-CM

## 2021-08-12 NOTE — Progress Notes (Signed)
Kelli Vaughn is a 66 y.o. female who presents to [facility] clinic today with complaint of bilateral breast pain x one year that is greater within the left breast that occurs two times per month. Patient referred to [facility] by [facility] due to having a screening mammogram completed 05/20/2021 at [facility] and additional imaging recommended for follow up. Patients previous diagnostic mammogram and breast ultrasound completed at the [facility] on 12/15/2019 that was bi-rads 5 with a left stereo biopsy recommended for follow up.   Pap Smear: Pap smear not completed today. Last Pap smear was 08/25/2016 at [facility] clinic and was normal with negative HPV. Per patient has no history of an abnormal Pap smear. Last Pap smear result is available in Epic.   Physical exam: Breasts Breasts symmetrical. No skin abnormalities left breast. Observed a scar on the right upper outer quadrant of right breast that patient is unsure what happened. No nipple retraction bilateral breasts. No nipple discharge bilateral breasts. No lymphadenopathy. No lumps palpated bilateral breasts. No complaints of pain or tenderness on exam.     MM DIAG BREAST TOMO UNI LEFT  Result Date: 12/25/2019 CLINICAL DATA:  Patient recalled from screening for left breast mass/distortion and calcifications. EXAM: DIGITAL DIAGNOSTIC LEFT MAMMOGRAM WITH CAD AND TOMO ULTRASOUND LEFT BREAST COMPARISON:  Previous exam(s). ACR Breast Density Category b: There are scattered areas of fibroglandular density. FINDINGS: Spot compression views and magnification views of the left breast were obtained. Within the upper inner left breast there is a persistent irregular mass with associated distortion. Extending anterior and lateral from this mass are suspicious pleomorphic calcifications measuring approximately 3.0 x 1.6 cm in dimension. Mammographic images were processed with CAD. Targeted ultrasound is performed, showing a 6 x 9 x 5 mm  irregular hypoechoic mass left breast 9:30 o'clock 5 cm from nipple. There are 2 cortically thickened nodes within the left axilla, the largest demonstrating a cortex of 11 mm. IMPRESSION: 1. Suspicious mass left breast 9:30 o'clock 5 cm from the nipple corresponding with focal area of distortion on mammography. 2. There are suspicious pleomorphic calcifications within the upper inner left breast at the site of the above described mass extending anterior and lateral from the mass. RECOMMENDATION: 1. Ultrasound-guided core needle biopsy left breast mass 9:30 o'clock. 2. Stereotactic guided core needle biopsy of the anterior lateral most aspect of the pleomorphic calcifications within the upper inner left breast at the site of suspicious mass. 3. Ultrasound-guided core needle biopsy of 1 of the cortically thickened left axillary lymph nodes. I have discussed the findings and recommendations with the patient. If applicable, a reminder letter will be sent to the patient regarding the next appointment. BI-RADS CATEGORY  5: Highly suggestive of malignancy. Electronically Signed   By: Lovey Newcomer M.D.   On: 12/25/2019 12:44   MS DIGITAL SCREENING TOMO BILATERAL  Addendum Date: 12/19/2019   ADDENDUM REPORT: 12/19/2019 13:02 ADDENDUM: The possible distortion associated with calcifications and possible mass is in the LEFT breast. I incorrectly stated the right breast on the report of the screening mammogram. I also now have available the tomosynthesis MLO images which were not available at the time of initial interpretation. These do not need to be repeated at the time of the diagnostic mammogram. Electronically Signed   By: Evangeline Dakin M.D.   On: 12/19/2019 13:02   Result Date: 12/19/2019 CLINICAL DATA:  Screening. Please note that the LEFT MLO tomosynthesis images are currently unavailable due to  technical difficulties, though all of the remaining images, including the synthesized LEFT MLO image, is available.  EXAM: DIGITAL SCREENING BILATERAL MAMMOGRAM WITH TOMO AND CAD COMPARISON:  01/20/2012. ACR Breast Density Category b: There are scattered areas of fibroglandular density. FINDINGS: In the right breast, possible distortion associated with calcifications and associated with a possible mass warrants further evaluation. In the left breast, no findings suspicious for malignancy. Images were processed with CAD. IMPRESSION: Further evaluation is suggested for possible distortion associated with calcifications and associated with a possible mass in the right breast. RECOMMENDATION: Diagnostic mammogram and possibly ultrasound of the right breast. The LEFT MLO tomosynthesis images can repeated at the time of the diagnostic mammogram. (Code:[id number]) The patient will be contacted regarding the findings, and additional imaging will be scheduled. BI-RADS CATEGORY  0: Incomplete. Need additional imaging evaluation and/or prior mammograms for comparison. Electronically Signed: By: Evangeline Dakin M.D. On: 12/13/2019 16:10    Pelvic/Bimanual Pap is not indicated today per BCCCP guidelines. Patient scheduled for the free cervical cancer screening on Wednesday, September 08, 2021 at 1015.   Smoking History: Patient has never smoked.   Patient Navigation: Patient education provided. Access to services provided for patient through [facility] program. Spanish interpreter Rudene Anda from [facility] provided.   Colorectal Cancer Screening: Per patient had a colonoscopy completed in 2010. FIT Test given to patient to complete. No complaints today.    Breast and Cervical Cancer Risk Assessment: Patient has a family history of her sister having breast cancer. Patient has no known genetic mutations or history of radiation treatment to the chest before age 83. Patient has no history of cervical dysplasia, immunocompromised, or DES exposure in-utero.  Risk Assessment     Risk Scores       08/12/2021 12/12/2019   Last edited by:  Demetrius Revel, LPN Sindi Beckworth, Heath Gold, RN   5-year risk: 2.1 % 1.9 %   Lifetime risk: 9.2 % 9.8 %            A: B[facility]CP exam without pap smear Complaint of bilateral breast pain.  P: Referred patient to the [facility] for a diagnostic mammogram per recommendation. Appointment scheduled Thursday, August 12, 2021 at 1330.  Loletta Parish, RN 08/12/2021 10:36 AM

## 2021-08-12 NOTE — Patient Instructions (Addendum)
Explained breast self awareness with Kelli Vaughn. Patient did not need a Pap smear today due to last Pap smear and HPV typing was 08/25/2016. Patient scheduled for the free cervical cancer screening on Wednesday, September 08, 2021 at 1015. Referred patient to the [facility] for a diagnostic mammogram per recommendation. Appointment scheduled Thursday, August 12, 2021 at 1330. Patient aware of appointment and will be there. Kelli Vaughn verbalized understanding.  Kelli Vaughn, Arvil Chaco, RN 10:36 AM

## 2021-08-25 ENCOUNTER — Other Ambulatory Visit: Payer: No Typology Code available for payment source

## 2021-08-26 ENCOUNTER — Ambulatory Visit
Admission: RE | Admit: 2021-08-26 | Discharge: 2021-08-26 | Disposition: A | Discharge status: Home / Self Care | Payer: No Typology Code available for payment source | Source: Ambulatory Visit | Attending: Pediatrics | Admitting: Pediatrics

## 2021-08-26 ENCOUNTER — Other Ambulatory Visit: Payer: Self-pay

## 2021-08-26 DIAGNOSIS — R928 Other abnormal and inconclusive findings on diagnostic imaging of breast: Secondary | ICD-10-CM

## 2021-08-30 NOTE — Progress Notes (Signed)
Radiation Oncology         [id number]) [phone] ________________________________  Name: Kelli Vaughn        MRN: [id number]  Date of Service: 09/01/2021 DOB: 03/21/1956  CC:[name], MD  [name], MD     REFERRING PHYSICIAN: [name], MD   DIAGNOSIS: The encounter diagnosis was Malignant neoplasm of upper-inner quadrant of left breast in female, estrogen receptor positive ([place]).   HISTORY OF PRESENT ILLNESS: Kelli Vaughn is a 66 y.o. female seen in the multidisciplinary breast clinic for a new diagnosis of left breast cancer. The patient was noted to have bilateral breast pain.  She had gone for screening mammogram at an outside facility that showed  left assymmtry in 2021. She did not have any further work up at that time but returned recently and screening showed again left breast assymmetry calcifications, and a mass, as well as an abnormal appearing axillary node. By ultrasound the mass and calcifications measured up to 3.7 cm, and 2 abnormal nodes were noted. 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 Her cancer was ER/PR positive, HER2 negative and Ki 67 was 5%. One of her nodes was sampled and negative but felt to be discordant. She's seen to discuss treatment recommendations of her cancer.    PREVIOUS RADIATION THERAPY: No   PAST MEDICAL HISTORY:  Past Medical History:  Diagnosis Date   Diabetes mellitus without complication ([place])    TYPE II   Glaucoma    Hypertension        PAST SURGICAL HISTORY: Past Surgical History:  Procedure Laterality Date   CESAREAN SECTION     X2   GLAUCOMA SURGERY       FAMILY HISTORY:  Family History  Problem Relation Age of Onset   Heart disease Mother    Cancer - Other Father    Breast cancer Sister 69   Stomach cancer Sister      SOCIAL HISTORY:  reports that she has never smoked. She has never used smokeless tobacco. She reports that she does  not drink alcohol and does not use drugs.  The patient is widowed and lives in [place]. She is accompanied by her two sons.    ALLERGIES: Patient has no known allergies.   MEDICATIONS:  Current Outpatient Medications  Medication Sig Dispense Refill   aspirin EC 81 MG tablet Take 81 mg by mouth daily.     COMBIGAN 0.2-0.5 % ophthalmic solution Apply 1 drop to eye every 12 (twelve) hours.  (Patient not taking: Reported on 08/12/2021)  3   glipiZIDE (GLUCOTROL) 5 MG tablet Take 5 mg by mouth daily.      ibuprofen (ADVIL,MOTRIN) 800 MG tablet Take 1,600 mg by mouth every 8 (eight) hours as needed for moderate pain.     insulin glargine (LANTUS) 100 UNIT/ML injection Inject 70 Units into the skin every morning.      lisinopril (PRINIVIL,ZESTRIL) 20 MG tablet Take 20 mg by mouth daily.     LUMIGAN 0.01 % SOLN Place 1 drop into both eyes at bedtime.   3   propranolol (INDERAL) 40 MG tablet Take 40 mg by mouth 2 (two) times daily.     ranitidine (ZANTAC) 300 MG capsule Take 300 mg by mouth daily.     sitaGLIPtin-metformin (JANUMET) 50-1000 MG tablet Take 1 tablet by mouth 2 (two) times daily with a meal.     No current facility-administered medications for this  encounter.     REVIEW OF SYSTEMS: On review of systems, the patient reports that she is doing okay but has noticed soreness and bruising of her breast since her biopsy. No other complaints are verbalized.      PHYSICAL EXAM:  Wt Readings from Last 3 Encounters:  08/12/21 197 lb 1.6 oz (89.4 kg)  12/12/19 196 lb 4.8 oz (89 kg)  05/22/18 195 lb (88.5 kg)   Temp Readings from Last 3 Encounters:  12/12/19 (!) 97.3 F (36.3 C) (Temporal)  03/18/16 98.2 F (36.8 C) (Oral)   BP Readings from Last 3 Encounters:  08/12/21 138/60  12/12/19 130/64  05/22/18 (!) 168/53   Pulse Readings from Last 3 Encounters:  05/22/18 73  03/18/16 64    In general this is a well appearing Hispanic female in no acute distress. She's alert and  oriented x4 and appropriate throughout the examination. Cardiopulmonary assessment is negative for acute distress and she exhibits normal effort. Bilateral breast exam is deferred.    ECOG = 1  0 - Asymptomatic (Fully active, able to carry on all predisease activities without restriction)  1 - Symptomatic but completely ambulatory (Restricted in physically strenuous activity but ambulatory and able to carry out work of a light or sedentary nature. For example, light housework, office work)  2 - Symptomatic, <50% in bed during the day (Ambulatory and capable of all self care but unable to carry out any work activities. Up and about more than 50% of waking hours)  3 - Symptomatic, >50% in bed, but not bedbound (Capable of only limited self-care, confined to bed or chair 50% or more of waking hours)  4 - Bedbound (Completely disabled. Cannot carry on any self-care. Totally confined to bed or chair)  5 - Death   Eustace Pen MM, Creech RH, Tormey DC, et al. [phone]). "Toxicity and response criteria of the [facility]". [place] Oncol. 5 (6): 649-55    LABORATORY DATA:  Lab Results  Component Value Date   WBC 18.4 (H) 05/22/2018   HGB 14.4 05/22/2018   HCT 45.5 05/22/2018   MCV 89.6 05/22/2018   PLT 319 05/22/2018   Lab Results  Component Value Date   NA 137 05/22/2018   K 3.8 05/22/2018   CL 105 05/22/2018   CO2 24 05/22/2018   Lab Results  Component Value Date   ALT 47 (H) 05/22/2018   AST 33 05/22/2018   ALKPHOS 109 05/22/2018   BILITOT 0.6 05/22/2018      RADIOGRAPHY: US BREAST LTD UNI LEFT INC AXILLA  Result Date: 08/12/2021 CLINICAL DATA:  Patient was called back for evaluation of LEFT breast calcifications, focal asymmetry, and possible enlarged LEFT axillary lymph node from screening mammogram at [facility] on 05/20/2021. Patient was seen at the [facility] on 12/25/2019 for mass in the 9:30 o'clock location of  the LEFT breast. Biopsy was recommended for this mass. 2 LEFT axillary lymph nodes with abnormal morphology were also identified at the time of that exam and biopsy was recommended of one of these lymph nodes. Biopsy has not been performed. EXAM: DIGITAL DIAGNOSTIC UNILATERAL LEFT MAMMOGRAM WITH TOMOSYNTHESIS AND CAD; ULTRASOUND LEFT BREAST LIMITED TECHNIQUE: Left digital diagnostic mammography and breast tomosynthesis was performed. The images were evaluated with computer-aided detection.; Targeted ultrasound examination of the left breast was performed. COMPARISON:  Multiple prior studies including 05/20/2021 ([facility]), 12/25/2019 and earlier (from the [facility]) ACR  Breast Density Category b: There are scattered areas of fibroglandular density. FINDINGS: Spot compression and magnified views are performed of the LEFT breast. These views demonstrate a spiculated mass associated with linear and pleomorphic calcifications in the UPPER INNER QUADRANT of the LEFT breast. The calcifications and mass together span 3.7 x 1.8 x 1.2 centimeters. Targeted ultrasound is performed, showing an oval mass with indistinct margins and posterior acoustic shadowing in the 9:30 o'clock location of the LEFT breast 5 centimeters from the nipple. No internal blood flow identified on Doppler evaluation. Mass measures 0.7 x 0.9 x 0.5 centimeters. At real-time imaging, there is associated distortion in this region. Evaluation of the LEFT axilla shows 2 morphologically abnormal lymph nodes, 1 with cortical thickening of 1.0 centimeters. A second lymph node has cortical thickening of 3 millimeters. Other LEFT axillary lymph nodes have normal morphology. IMPRESSION: 1. Suspicious mass in the 9:30 o'clock location of the LEFT breast warranting tissue diagnosis. 2. Two LEFT axillary lymph nodes with abnormal morphology. RECOMMENDATION: 1. Recommend ultrasound-guided core biopsy of mass in the 9:30 o'clock  location of the LEFT breast. Given the associated suspicious calcifications in this region, I would recommend bracketed localization to include calcifications if excision is needed. 2. Recommend ultrasound-guided core biopsy of the larger of the LEFT axillary lymph nodes. I have discussed the findings and recommendations with the patient with the assistance of an interpreter. If applicable, a reminder letter will be sent to the patient regarding the next appointment. BI-RADS CATEGORY  5: Highly suggestive of malignancy. Electronically Signed   By: Nolon Nations M.D.   On: 08/12/2021 16:25  MM DIAG BREAST TOMO UNI LEFT  Result Date: 08/12/2021 CLINICAL DATA:  Patient was called back for evaluation of LEFT breast calcifications, focal asymmetry, and possible enlarged LEFT axillary lymph node from screening mammogram at [facility] on 05/20/2021. Patient was seen at the [facility] on 12/25/2019 for mass in the 9:30 o'clock location of the LEFT breast. Biopsy was recommended for this mass. 2 LEFT axillary lymph nodes with abnormal morphology were also identified at the time of that exam and biopsy was recommended of one of these lymph nodes. Biopsy has not been performed. EXAM: DIGITAL DIAGNOSTIC UNILATERAL LEFT MAMMOGRAM WITH TOMOSYNTHESIS AND CAD; ULTRASOUND LEFT BREAST LIMITED TECHNIQUE: Left digital diagnostic mammography and breast tomosynthesis was performed. The images were evaluated with computer-aided detection.; Targeted ultrasound examination of the left breast was performed. COMPARISON:  Multiple prior studies including 05/20/2021 ([facility]), 12/25/2019 and earlier (from the [facility]) ACR Breast Density Category b: There are scattered areas of fibroglandular density. FINDINGS: Spot compression and magnified views are performed of the LEFT breast. These views demonstrate a spiculated mass associated with linear and pleomorphic  calcifications in the UPPER INNER QUADRANT of the LEFT breast. The calcifications and mass together span 3.7 x 1.8 x 1.2 centimeters. Targeted ultrasound is performed, showing an oval mass with indistinct margins and posterior acoustic shadowing in the 9:30 o'clock location of the LEFT breast 5 centimeters from the nipple. No internal blood flow identified on Doppler evaluation. Mass measures 0.7 x 0.9 x 0.5 centimeters. At real-time imaging, there is associated distortion in this region. Evaluation of the LEFT axilla shows 2 morphologically abnormal lymph nodes, 1 with cortical thickening of 1.0 centimeters. A second lymph node has cortical thickening of 3 millimeters. Other LEFT axillary lymph nodes have normal morphology. IMPRESSION: 1. Suspicious mass in the 9:30 o'clock location of the  LEFT breast warranting tissue diagnosis. 2. Two LEFT axillary lymph nodes with abnormal morphology. RECOMMENDATION: 1. Recommend ultrasound-guided core biopsy of mass in the 9:30 o'clock location of the LEFT breast. Given the associated suspicious calcifications in this region, I would recommend bracketed localization to include calcifications if excision is needed. 2. Recommend ultrasound-guided core biopsy of the larger of the LEFT axillary lymph nodes. I have discussed the findings and recommendations with the patient with the assistance of an interpreter. If applicable, a reminder letter will be sent to the patient regarding the next appointment. BI-RADS CATEGORY  5: Highly suggestive of malignancy. Electronically Signed   By: Nolon Nations M.D.   On: 08/12/2021 16:25   [place] AXILLARY NODE CORE BIOPSY LEFT  Result Date: 08/26/2021 CLINICAL DATA:  66 year old female presents for ultrasound-guided core biopsy of a suspicious mass in the left breast at the 9:30 position and biopsy of an abnormal lymph node in the left axilla. EXAM: ULTRASOUND GUIDED LEFT BREAST CORE NEEDLE BIOPSY ULTRASOUND-GUIDED LEFT AXILLA CORE NEEDLE  BIOPSY COMPARISON:  Previous exam(s). PROCEDURE: I met with the patient and we discussed the procedure of ultrasound-guided biopsy, including benefits and alternatives. We discussed the high likelihood of a successful procedure. We discussed the risks of the procedure, including infection, bleeding, tissue injury, clip migration, and inadequate sampling. Informed written consent was given. The usual time-out protocol was performed immediately prior to the procedure. SITE 1: LEFT BREAST 9:30 POSITION MASS: Lesion quadrant: UPPER INNER Using sterile technique and 1% Lidocaine as local anesthetic, under direct ultrasound visualization, a 14 gauge spring-loaded device was used to perform biopsy of the mass in the left breast at the 9:30 position using a medial to lateral approach. At the conclusion of the procedure a ribbon shaped tissue marker clip was deployed into the biopsy cavity. Follow up 2 view mammogram was performed and dictated separately. SITE 2: LEFT AXILLA LYMPH NODE: Lesion quadrant: UPPER OUTER Using sterile technique and 1% Lidocaine as local anesthetic, under direct ultrasound visualization, a 14 gauge spring-loaded device was used to perform biopsy of the abnormal lymph node in the left axilla using an inferolateral to superomedial approach. At the conclusion of the procedure a tri bell tissue marker clip was deployed into the biopsy cavity. Follow up 2 view mammogram was performed and dictated separately. IMPRESSION: 1. Ultrasound-guided biopsy of the mass in the left breast at the 9:30 position, at site of ribbon shaped biopsy marking clip. 2. Ultrasound-guided biopsy of the abnormal lymph node in the left axilla, at site of tri bell biopsy marking clip. Electronically Signed   By: Everlean Alstrom M.D.   On: 08/26/2021 13:41  MM CLIP PLACEMENT LEFT  Result Date: 08/26/2021 CLINICAL DATA:  Post ultrasound-guided biopsy of a mass with associated calcifications in the left breast at the 9:30  position and ultrasound-guided biopsy of an abnormal lymph node in the left axilla. EXAM: 3D DIAGNOSTIC LEFT MAMMOGRAM POST ULTRASOUND BIOPSY COMPARISON:  Previous exam(s). FINDINGS: 3D Mammographic images were obtained following ultrasound-guided biopsy of a mass with associated calcifications in the left breast at the 9:30 position and ultrasound-guided biopsy of an abnormal lymph node in the left axilla. A ribbon shaped biopsy marking clip is present at the site of the biopsied mass with associated calcifications in the left breast at the 9:30 position. A tri bell biopsy marking clip is present at the site of the biopsied lymph node in the left axilla. IMPRESSION: 1. Ribbon shaped biopsy marking clip at site of biopsied  mass in the left breast at the 9:30 position. 2. Tri bell shaped biopsy marking clip at site of biopsied lymph node in the left axilla. Final Assessment: Post Procedure Mammograms for Marker Placement Electronically Signed   By: Everlean Alstrom M.D.   On: 08/26/2021 13:56  [place] LT BREAST BX W LOC DEV 1ST LESION IMG BX SPEC US GUIDE  Result Date: 08/26/2021 CLINICAL DATA:  66 year old female presents for ultrasound-guided core biopsy of a suspicious mass in the left breast at the 9:30 position and biopsy of an abnormal lymph node in the left axilla. EXAM: ULTRASOUND GUIDED LEFT BREAST CORE NEEDLE BIOPSY ULTRASOUND-GUIDED LEFT AXILLA CORE NEEDLE BIOPSY COMPARISON:  Previous exam(s). PROCEDURE: I met with the patient and we discussed the procedure of ultrasound-guided biopsy, including benefits and alternatives. We discussed the high likelihood of a successful procedure. We discussed the risks of the procedure, including infection, bleeding, tissue injury, clip migration, and inadequate sampling. Informed written consent was given. The usual time-out protocol was performed immediately prior to the procedure. SITE 1: LEFT BREAST 9:30 POSITION MASS: Lesion quadrant: UPPER INNER Using sterile  technique and 1% Lidocaine as local anesthetic, under direct ultrasound visualization, a 14 gauge spring-loaded device was used to perform biopsy of the mass in the left breast at the 9:30 position using a medial to lateral approach. At the conclusion of the procedure a ribbon shaped tissue marker clip was deployed into the biopsy cavity. Follow up 2 view mammogram was performed and dictated separately. SITE 2: LEFT AXILLA LYMPH NODE: Lesion quadrant: UPPER OUTER Using sterile technique and 1% Lidocaine as local anesthetic, under direct ultrasound visualization, a 14 gauge spring-loaded device was used to perform biopsy of the abnormal lymph node in the left axilla using an inferolateral to superomedial approach. At the conclusion of the procedure a tri bell tissue marker clip was deployed into the biopsy cavity. Follow up 2 view mammogram was performed and dictated separately. IMPRESSION: 1. Ultrasound-guided biopsy of the mass in the left breast at the 9:30 position, at site of ribbon shaped biopsy marking clip. 2. Ultrasound-guided biopsy of the abnormal lymph node in the left axilla, at site of tri bell biopsy marking clip. Electronically Signed   By: Everlean Alstrom M.D.   On: 08/26/2021 13:41      IMPRESSION/PLAN: 1. 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 Dr. Lisbeth Renshaw discusses the pathology findings and reviews the nature of node positive breast disease. The consensus from the breast conference includes breast conservation with lumpectomy with  sentinel node biopsy. Dr. Chryl Heck recommends Oncotype Dx score to determine a role for systemic therapy. Provided that chemotherapy is not indicated, the patient's course would then be followed by external radiotherapy to the breast  to reduce risks of local recurrence followed by antiestrogen therapy. We discussed the risks, benefits, short, and long term effects of radiotherapy, as well as the  curative intent, and the patient is interested in proceeding. Dr. Lisbeth Renshaw discusses the delivery and logistics of radiotherapy and anticipates a course of 6 1/2  weeks of radiotherapy to the left breast and regional nodes. We will see her back a few weeks after surgery to discuss the simulation process and anticipate we starting radiotherapy about 4-6 weeks after surgery.  2. Possible genetic predisposition to malignancy. The patient is a candidate for genetic testing given her personal and family history. She was offered referral and will meet with genetic counseling today.  In a visit lasting 60 minutes, greater than 50% of the time was spent face to face reviewing her case, as well as in preparation of, discussing, and coordinating the patient's care.  The above documentation reflects my direct findings during this shared patient visit. Please see the separate note by Dr. Lisbeth Renshaw on this date for the remainder of the patient's plan of care.    Carola Rhine, [facility]    **Disclaimer: This note was dictated with voice recognition software. Similar sounding words can inadvertently be transcribed and this note may contain transcription errors which may not have been corrected upon publication of note.**

## 2021-08-31 ENCOUNTER — Telehealth: Payer: Self-pay | Admitting: *Deleted

## 2021-08-31 ENCOUNTER — Encounter: Payer: Self-pay | Admitting: *Deleted

## 2021-08-31 DIAGNOSIS — C50212 Malignant neoplasm of upper-inner quadrant of left female breast: Secondary | ICD-10-CM

## 2021-08-31 DIAGNOSIS — Z170 Estrogen receptor positive status [ER+]: Secondary | ICD-10-CM

## 2021-08-31 NOTE — Progress Notes (Signed)
[place] NOTE  Patient Care Team: [name], MD as PCP - General (Family Medicine) [name], MD as Consulting Physician (General Surgery) Benay Pike, MD as Consulting Physician (Hematology and Oncology) Kyung Rudd, MD as Consulting Physician (Radiation Oncology) Rockwell Germany, RN as Oncology Nurse Navigator Mauro Kaufmann, RN as Oncology Nurse Navigator  CHIEF COMPLAINTS/PURPOSE OF CONSULTATION:  Newly diagnosed breast cancer  HISTORY OF PRESENTING ILLNESS:  Kelli Vaughn 66 y.o. female is here because of recent diagnosis of left IDC. I reviewed her records extensively and collaborated the history with the patient.  SUMMARY OF ONCOLOGIC HISTORY: Oncology History Overview Note  Patient was called back for evaluation of LEFT breast calcifications, focal asymmetry, and possible enlarged LEFT axillary lymph node from screening mammogram at [place] on 05/20/2021. 08/12/2021, Suspicious mass in the 9:30 o'clock location of the LEFT breast warranting tissue diagnosis. Two LEFT axillary lymph nodes with abnormal morphology. 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 Lymph node biopsy of left axilla negative, lymph node biopsy results were thought to be discordant.   Patient does recollect being asked to schedule biopsy back in 2021 for abnormal mammogram but she was worried or had this misinformation that biopsy can let the breast cancer spread to other parts hence she decided not to proceed with biopsy at that time. She is here with her 2 sons, speaks in [place], [place] interpreter present at the time of my visit.  Son also help with the translation.   Malignant neoplasm of upper-inner quadrant of left breast in female, estrogen receptor positive ([place])  08/31/2021 Initial Diagnosis   Malignant neoplasm of upper-inner  quadrant of left breast in female, estrogen receptor positive ([place])    She was presented in the breast [place] today.  She denies any new health complaints.  She has type 2 diabetes without any known complications, well-controlled high blood pressure  MEDICAL HISTORY:  Past Medical History:  Diagnosis Date   Diabetes mellitus without complication ([place])    TYPE II   Glaucoma    Hypertension     SURGICAL HISTORY: Past Surgical History:  Procedure Laterality Date   CESAREAN SECTION     X2   GLAUCOMA SURGERY      SOCIAL HISTORY: Social History   Socioeconomic History   Marital status: Widowed    Spouse name: Not on file   Number of children: 2   Years of education: Not on file   Highest education level: Some college, no degree  Occupational History   Not on file  Tobacco Use   Smoking status: Never   Smokeless tobacco: Never  Vaping Use   Vaping Use: Never used  Substance and Sexual Activity   Alcohol use: No   Drug use: No   Sexual activity: Not Currently  Other Topics Concern   Not on file  Social History Narrative   Not on file   Social Determinants of Health   Financial Resource Strain: Not on file  Food Insecurity: No Food Insecurity   Worried About Running Out of Food in the Last Year: Never true   Ran Out of Food in the Last Year: Never true  Transportation Needs: No Transportation Needs   Lack of Transportation (Medical): No   Lack of Transportation (Non-Medical): No  Physical Activity: Not on file  Stress: Not on file  Social  Connections: Not on file  Intimate Partner Violence: Not on file    FAMILY HISTORY: Family History  Problem Relation Age of Onset   Heart disease Mother    Cancer - Other Father    Breast cancer Sister 75   Stomach cancer Sister     ALLERGIES:  has No Known Allergies.  MEDICATIONS:  Current Outpatient Medications  Medication Sig Dispense Refill   aspirin EC 81 MG tablet Take 81 mg by mouth daily.     COMBIGAN 0.2-0.5  % ophthalmic solution Apply 1 drop to eye every 12 (twelve) hours.  (Patient not taking: Reported on 08/12/2021)  3   glipiZIDE (GLUCOTROL) 5 MG tablet Take 5 mg by mouth daily.      ibuprofen (ADVIL,MOTRIN) 800 MG tablet Take 1,600 mg by mouth every 8 (eight) hours as needed for moderate pain.     insulin glargine (LANTUS) 100 UNIT/ML injection Inject 70 Units into the skin every morning.      lisinopril (PRINIVIL,ZESTRIL) 20 MG tablet Take 20 mg by mouth daily.     LUMIGAN 0.01 % SOLN Place 1 drop into both eyes at bedtime.   3   propranolol (INDERAL) 40 MG tablet Take 40 mg by mouth 2 (two) times daily.     ranitidine (ZANTAC) 300 MG capsule Take 300 mg by mouth daily.     sitaGLIPtin-metformin (JANUMET) 50-1000 MG tablet Take 1 tablet by mouth 2 (two) times daily with a meal.     No current facility-administered medications for this visit.    REVIEW OF SYSTEMS:    Breast:  Denies any palpable lumps or discharge   PHYSICAL EXAMINATION: ECOG PERFORMANCE STATUS: 0 - Asymptomatic  Vitals:   09/01/21 0911  BP: (!) 151/61  Pulse: 89  Resp: 16  Temp: 97.9 F (36.6 C)  SpO2: 97%   Filed Weights   09/01/21 0911  Weight: 196 lb 6.4 oz (89.1 kg)    Physical Exam Constitutional:      Appearance: Normal appearance.  HENT:     Head: Normocephalic and atraumatic.  Cardiovascular:     Rate and Rhythm: Normal rate and regular rhythm.  Chest:       Comments: Palpable left axillary LN. left breast with ecchymosis but no definitive palpable mass. Abdominal:     General: Abdomen is flat.  Musculoskeletal:     Cervical back: No rigidity.  Lymphadenopathy:     Cervical: No cervical adenopathy.  Skin:    General: Skin is warm and dry.  Neurological:     General: No focal deficit present.     Mental Status: She is alert.     LABORATORY DATA:  I have reviewed the data as listed Lab Results  Component Value Date   WBC 11.7 (H) 09/01/2021   HGB 13.4 09/01/2021   HCT 40.7  09/01/2021   MCV 88.7 09/01/2021   PLT 306 09/01/2021   Lab Results  Component Value Date   NA 138 09/01/2021   K 4.0 09/01/2021   CL 103 09/01/2021   CO2 27 09/01/2021    RADIOGRAPHIC STUDIES: I have personally reviewed the radiological reports and agreed with the findings in the report.  ASSESSMENT AND PLAN:  Malignant neoplasm of upper-inner quadrant of left breast in female, estrogen receptor positive ([place]) This is a very pleasant 66 year old female patient with past medical history significant for type 2 diabetes mellitus, hypertension who had an abnormal mammogram back in 2021 with 6 mm breast mass in the left  breast at 930 o'clock as well as left abnormal axillary lymph nodes, did not undergo biopsy at that time as recommended who presented with abnormal mammogram again which led to further testing.  Left breast needle core biopsy at this time showed invasive ductal carcinoma, grade 2 along with DCIS, ER +80% strong intensity, PR +100% strong intensity, KI 5% and HER2 negative.  Lymph node biopsy was negative which was thought to be discordant.  She was presented in the breast [place] today for recommendations.  At this time given the unknown size of the tumor although the mass and calyx together measure almost about 3.7 cm in largest dimension, we have discussed about proceeding with upfront surgery.  Following surgery, if she has 1-3 positive sentinel lymph nodes involved along with tumor in the right breast larger than 5 mm, we should consider an Oncotype.  If she has more than 3 abnormal or positive lymph nodes on final pathology, then we will have to discuss adjuvant chemotherapy.  I have briefly talked to her about Oncotype today as mentioned below.  We have discussed about Oncotype Dx score which is a well validated prognostic scoring system which can predict outcome with endocrine therapy alone and whether chemotherapy reduces recurrence.  Typically in patients with ER positive cancers  that are node negative if the RS score is high typically greater than or equal to 26, chemotherapy is recommended.   If she is not a candidate for chemotherapy, she will proceed with adjuvant radiation which will then be followed by antiestrogen therapy.  I have discussed about available antiestrogen therapy options, adverse effects including but postmenopausal symptoms, arthralgias, myalgias, bone loss with aromatase and this.  We will plan to start her on anastrozole postradiation for antiestrogen therapy, recommended duration is a minimum of 5 years.  She had some expressed understanding of all the recommendations.  Son had all relevant questions which have been answered.  She should return to clinic to follow-up with [place] after surgery.   All questions were answered. The patient knows to call the clinic with any problems, questions or concerns.    Benay Pike, MD 09/01/21

## 2021-08-31 NOTE — Telephone Encounter (Signed)
Note from 2/17- Patient called via Almyra Free (interpreter) to confirm appt for 2/22 at 8:15am.  Spanish packet emailed.

## 2021-09-01 ENCOUNTER — Encounter: Payer: Self-pay | Admitting: *Deleted

## 2021-09-01 ENCOUNTER — Encounter: Payer: Self-pay | Admitting: Physical Therapy

## 2021-09-01 ENCOUNTER — Other Ambulatory Visit: Payer: Self-pay | Admitting: General Surgery

## 2021-09-01 ENCOUNTER — Inpatient Hospital Stay (HOSPITAL_BASED_OUTPATIENT_CLINIC_OR_DEPARTMENT_OTHER): Payer: Self-pay | Admitting: Hematology and Oncology

## 2021-09-01 ENCOUNTER — Ambulatory Visit
Admission: RE | Admit: 2021-09-01 | Discharge: 2021-09-01 | Disposition: A | Discharge status: Home / Self Care | Payer: No Typology Code available for payment source | Source: Ambulatory Visit | Attending: Radiation Oncology | Admitting: Radiation Oncology

## 2021-09-01 ENCOUNTER — Other Ambulatory Visit: Payer: Self-pay

## 2021-09-01 ENCOUNTER — Ambulatory Visit: Payer: No Typology Code available for payment source | Attending: General Surgery | Admitting: Physical Therapy

## 2021-09-01 ENCOUNTER — Ambulatory Visit (HOSPITAL_BASED_OUTPATIENT_CLINIC_OR_DEPARTMENT_OTHER): Payer: No Typology Code available for payment source | Admitting: Genetic Counselor

## 2021-09-01 ENCOUNTER — Inpatient Hospital Stay: Payer: Self-pay | Attending: Hematology and Oncology

## 2021-09-01 ENCOUNTER — Encounter: Payer: Self-pay | Admitting: Hematology and Oncology

## 2021-09-01 ENCOUNTER — Inpatient Hospital Stay: Payer: No Typology Code available for payment source | Admitting: Licensed Clinical Social Worker

## 2021-09-01 DIAGNOSIS — Z170 Estrogen receptor positive status [ER+]: Secondary | ICD-10-CM

## 2021-09-01 DIAGNOSIS — R293 Abnormal posture: Secondary | ICD-10-CM

## 2021-09-01 DIAGNOSIS — C50212 Malignant neoplasm of upper-inner quadrant of left female breast: Secondary | ICD-10-CM

## 2021-09-01 DIAGNOSIS — Z293 Encounter for prophylactic fluoride administration: Secondary | ICD-10-CM | POA: Insufficient documentation

## 2021-09-01 DIAGNOSIS — Z803 Family history of malignant neoplasm of breast: Secondary | ICD-10-CM

## 2021-09-01 LAB — CMP (CANCER CENTER ONLY)
ALT: 25 U/L (ref 0–44)
AST: 15 U/L (ref 15–41)
Albumin: 4.1 g/dL (ref 3.5–5.0)
Alkaline Phosphatase: 89 U/L (ref 38–126)
Anion gap: 8 (ref 5–15)
BUN: 20 mg/dL (ref 8–23)
CO2: 27 mmol/L (ref 22–32)
Calcium: 10.1 mg/dL (ref 8.9–10.3)
Chloride: 103 mmol/L (ref 98–111)
Creatinine: 0.55 mg/dL (ref 0.44–1.00)
GFR, Estimated: >60 mL/min (ref >60)
Glucose, Bld: 104 mg/dL — ABNORMAL HIGH (ref 70–99)
Potassium: 4 mmol/L (ref 3.5–5.1)
Sodium: 138 mmol/L (ref 135–145)
Total Bilirubin: 0.3 mg/dL (ref 0.3–1.2)
Total Protein: 7.1 g/dL (ref 6.5–8.1)

## 2021-09-01 LAB — CBC WITH DIFFERENTIAL (CANCER CENTER ONLY)
Abs Immature Granulocytes: 0.05 10*3/uL (ref 0.00–0.07)
Basophils Absolute: 0.1 10*3/uL (ref 0.0–0.1)
Basophils Relative: 1 %
Eosinophils Absolute: 0.4 10*3/uL (ref 0.0–0.5)
Eosinophils Relative: 4 %
HCT: 40.7 % (ref 36.0–46.0)
Hemoglobin: 13.4 g/dL (ref 12.0–15.0)
Immature Granulocytes: 0 %
Lymphocytes Relative: 28 %
Lymphs Abs: 3.2 10*3/uL (ref 0.7–4.0)
MCH: 29.2 pg (ref 26.0–34.0)
MCHC: 32.9 g/dL (ref 30.0–36.0)
MCV: 88.7 fL (ref 80.0–100.0)
Monocytes Absolute: 0.9 10*3/uL (ref 0.1–1.0)
Monocytes Relative: 7 %
Neutro Abs: 7 10*3/uL (ref 1.7–7.7)
Neutrophils Relative %: 60 %
Platelet Count: 306 10*3/uL (ref 150–400)
RBC: 4.59 MIL/uL (ref 3.87–5.11)
RDW: 12.8 % (ref 11.5–15.5)
WBC Count: 11.7 10*3/uL — ABNORMAL HIGH (ref 4.0–10.5)
nRBC: 0 % (ref 0.0–0.2)

## 2021-09-01 LAB — GENETIC SCREENING ORDER

## 2021-09-01 NOTE — Therapy (Signed)
OUTPATIENT PHYSICAL THERAPY BREAST CANCER BASELINE EVALUATION   Patient Name: Mackinzee Roszak MRN: [id number] DOB:1955-11-17, 66 y.o., female Today's Date: 09/01/2021   PT End of Session - 09/01/21 0955     Visit Number 1    Number of Visits 2    Date for PT Re-Evaluation 10/27/21    PT Start Time 0914    PT Stop Time 0943   Also saw pt from 1124-1140 for a total of 45 min   PT Time Calculation (min) 29 min    Activity Tolerance Patient tolerated treatment well    Behavior During Therapy [facility] for tasks assessed/performed             Past Medical History:  Diagnosis Date   Diabetes mellitus without complication ([place])    TYPE II   Glaucoma    Hypertension    Past Surgical History:  Procedure Laterality Date   CESAREAN SECTION     X2   GLAUCOMA SURGERY     Patient Active Problem List   Diagnosis Date Noted   Malignant neoplasm of upper-inner quadrant of left breast in female, estrogen receptor positive ([place]) 08/31/2021    PCP: [name], MD  REFERRING PROVIDER: [name], MD  REFERRING DIAG: Left breast cancer  THERAPY DIAG:  Malignant neoplasm of upper-inner quadrant of left breast in female, estrogen receptor positive ([place])  Abnormal posture  ONSET DATE: 05/20/2021  SUBJECTIVE                                                                                                                                                                                           SUBJECTIVE STATEMENT: Patient reports she is here today to be seen by her medical team for her newly diagnosed left breast cancer.   PERTINENT HISTORY:  Patient was diagnosed on 05/20/2021 with left grade II invasive ductal carcinoma breast cancer. It measures 3.7 cm and is located in the upper inner quadrant. It is ER/PR positive and HER 2 negative with a Ki67 of 5%. 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  PATIENT GOALS   reduce lymphedema risk and  learn post op HEP.   PAIN:  Are you having pain? Yes NPRS scale: 8/10 Pain location: right thumb Pain orientation: Right  PAIN TYPE: aching Pain description: intermittent  Aggravating factors: trying to use thumb Relieving factors: Rest; immobilizing thumb  PRECAUTIONS: Active CA   HAND DOMINANCE: right  WEIGHT BEARING RESTRICTIONS No  FALLS:  Has patient fallen in last 6 months? No, Number of falls: 0  LIVING ENVIRONMENT: Patient lives with: her son and grandson Lives in:  House/apartment Has following equipment at home: None  OCCUPATION: Unemployed  LEISURE: She does not exercise  PRIOR LEVEL OF FUNCTION: Independent   OBJECTIVE  COGNITION:  Overall cognitive status: Within functional limits for tasks assessed    POSTURE:  Forward head and rounded shoulders posture  UPPER EXTREMITY AROM/PROM:  A/PROM RIGHT  09/01/2021   Shoulder extension 47  Shoulder flexion 158  Shoulder abduction 154  Shoulder internal rotation 49  Shoulder external rotation 76    (Blank rows = not tested)  A/PROM LEFT  09/01/2021  Shoulder extension 46  Shoulder flexion 148  Shoulder abduction 147  Shoulder internal rotation 63  Shoulder external rotation 77    (Blank rows = not tested)   CERVICAL AROM: All within normal limits  UPPER EXTREMITY STRENGTH: WFL   LYMPHEDEMA ASSESSMENTS:   LANDMARK RIGHT  09/01/2021  10 cm proximal to olecranon process 32.2  Olecranon process 26.2  10 cm proximal to ulnar styloid process 24.6  Just proximal to ulnar styloid process 17.6  Across hand at thumb web space 19.4  At base of 2nd digit 6.5  (Blank rows = not tested)  LANDMARK LEFT  09/01/2021  10 cm proximal to olecranon process 34.5  Olecranon process 27.4  10 cm proximal to ulnar styloid process 23.5  Just proximal to ulnar styloid process 17.5  Across hand at thumb web space 19.1  At base of 2nd digit 6.3  (Blank rows = not tested)   L-DEX LYMPHEDEMA SCREENING:  The  patient was assessed using the L-Dex machine today to produce a lymphedema index baseline score. The patient will be reassessed on a regular basis (typically every 3 months) to obtain new L-Dex scores. If the score is > 6.5 points away from his/her baseline score indicating onset of subclinical lymphedema, it will be recommended to wear a compression garment for 4 weeks, 12 hours per day and then be reassessed. If the score continues to be > 6.5 points from baseline at reassessment, we will initiate lymphedema treatment. Assessing in this manner has a 95% rate of preventing clinically significant lymphedema.   L-DEX FLOWSHEETS - 09/01/21 1200       L-DEX LYMPHEDEMA SCREENING   Measurement Type Unilateral    L-DEX MEASUREMENT EXTREMITY Upper Extremity    POSITION  Standing    DOMINANT SIDE Right    At Risk Side Left    BASELINE SCORE (UNILATERAL) 1.5             PATIENT EDUCATION:  Education details: Lymphedema risk reduction and post op shoulder/posture HEP Person educated: Patient Education method: Explanation, Demonstration, Handout Education comprehension: Patient verbalized understanding and returned demonstration   HOME EXERCISE PROGRAM: Patient was instructed today in a home exercise program today for post op shoulder range of motion. These included active assist shoulder flexion in sitting, scapular retraction, wall walking with shoulder abduction, and hands behind head external rotation.  She was encouraged to do these twice a day, holding 3 seconds and repeating 5 times when permitted by her physician.   ASSESSMENT:  CLINICAL IMPRESSION: Patient was diagnosed on 05/20/2021 with left grade II invasive ductal carcinoma breast cancer. It measures 3.7 cm and is located in the upper inner quadrant. It is ER/PR positive and HER 2 negative with a Ki67 of 5%. 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 She is planning to have a left lumpectomy and  targeted node dissection followed by Oncotype testing, radiation, and anti-estrogen therapy. She will benefit from a post op PT reassessment to determine needs and from L-Dex screens every 3 months for 2 years to detect subclinical lymphedema.  Pt will benefit from skilled therapeutic intervention to improve on the following deficits: Decreased knowledge of precautions, impaired UE functional use, pain, decreased ROM, postural dysfunction.   PT treatment/interventions: ADL/self-care home management, pt/family education, therapeutic exercise  REHAB POTENTIAL: Excellent  CLINICAL DECISION MAKING: Stable/uncomplicated  EVALUATION COMPLEXITY: Low   GOALS: Goals reviewed with patient? YES  LONG TERM GOALS: (STG=LTG)   Name Target Date Goal status  1 Pt will be able to verbalize understanding of pertinent lymphedema risk reduction practices relevant to her dx specifically related to skin care.  Baseline:  No knowledge 09/01/2021 Achieved at eval  2 Pt will be able to return demo and/or verbalize understanding of the post op HEP related to regaining shoulder ROM. Baseline:  No knowledge 09/01/2021 Achieved at eval  3 Pt will be able to verbalize understanding of the importance of attending the post op After Breast CA Class for further lymphedema risk reduction education and therapeutic exercise.  Baseline:  No knowledge 09/01/2021 Achieved at eval  4 Pt will demo she has regained full shoulder ROM and function post operatively compared to baselines.  Baseline: See objective measurements taken today. 10/27/2021      PLAN: PT FREQUENCY/DURATION: EVAL and 1 follow up appointment.   PLAN FOR NEXT SESSION: will reassess 3-4 weeks post op to determine needs.   Patient will follow up at outpatient cancer rehab 3-4 weeks following surgery.  If the patient requires physical therapy at that time, a  specific plan will be dictated and sent to the referring physician for approval. The patient was educated today on appropriate basic range of motion exercises to begin post operatively and the importance of attending the After Breast Cancer class following surgery.  Patient was educated today on lymphedema risk reduction practices as it pertains to recommendations that will benefit the patient immediately following surgery.  She verbalized good understanding.    Physical Therapy Information for After Breast Cancer Surgery/Treatment:  Lymphedema is a swelling condition that you may be at risk for in your arm if you have lymph nodes removed from the armpit area.  After a sentinel node biopsy, the risk is approximately 5-9% and is higher after an axillary node dissection.  There is treatment available for this condition and it is not life-threatening.  Contact your physician or physical therapist with concerns. You may begin the 4 shoulder/posture exercises (see additional sheet) when permitted by your physician (typically a week after surgery).  If you have drains, you may need to wait until those are removed before beginning range of motion exercises.  A general recommendation is to not lift your arms above shoulder height until drains are removed.  These exercises should be done to your tolerance and gently.  This is not a "no pain/no gain" type of recovery so listen to your body and stretch into the range of motion that you can tolerate, stopping if you have pain.  If you are having immediate reconstruction, ask your plastic surgeon about doing exercises as he or she may want you to wait. We encourage you to attend the free one time ABC (After Breast Cancer) class offered by [facility].  You will learn information related to lymphedema risk, prevention and treatment and additional exercises to regain mobility  following surgery.  You can call [phone] for more information.  This  is offered the 1st and 3rd Monday of each month.  You only attend the class one time. While undergoing any medical procedure or treatment, try to avoid blood pressure being taken or needle sticks from occurring on the arm on the side of cancer.   This recommendation begins after surgery and continues for the rest of your life.  This may help reduce your risk of getting lymphedema (swelling in your arm). An excellent resource for those seeking information on lymphedema is the National Lymphedema Network's web site. It can be accessed at [place].org If you notice swelling in your hand, arm or breast at any time following surgery (even if it is many years from now), please contact your doctor or physical therapist to discuss this.  Lymphedema can be treated at any time but it is easier for you if it is treated early on.  If you feel like your shoulder motion is not returning to normal in a reasonable amount of time, please contact your surgeon or physical therapist.  [place] [phone]. [address], [place] [address]  ABC CLASS After Breast Cancer Class  After Breast Cancer Class is a specially designed exercise class to assist you in a safe recover after having breast cancer surgery.  In this class you will learn how to get back to full function whether your drains were just removed or if you had surgery a month ago.  This one-time class is held the 1st and 3rd Monday of every month from 11:00 a.m. until 12:00 noon virtually.  This class is FREE and space is limited. For more information or to register for the next available class, call [phone].  Class Goals  Understand specific stretches to improve the flexibility of you chest and shoulder. Learn ways to safely strengthen your upper body and improve your posture. Understand the warning signs of infection and why you may be at risk for an arm infection. Learn about Lymphedema and  prevention.  ** You do not attend this class until after surgery.  Drains must be removed to participate  Patient was instructed today in a home exercise program today for post op shoulder range of motion. These included active assist shoulder flexion in sitting, scapular retraction, wall walking with shoulder abduction, and hands behind head external rotation.  She was encouraged to do these twice a day, holding 3 seconds and repeating 5 times when permitted by her physician.  Annia Friendly, [place] 09/01/21 12:52 PM

## 2021-09-01 NOTE — Progress Notes (Signed)
[facility] Clinical Social Work  Initial Assessment   Kelli Vaughn Basta Kalaya Infantino is a 66 y.o. year old female accompanied by sons (Anuar and Edar) and [place] interpreter Almyra Free) . Clinical Social Work was referred by  [facility]  for assessment of psychosocial needs.   SDOH (Social Determinants of Health) assessments performed: Yes SDOH Interventions    Flowsheet Row Most Recent Value  SDOH Interventions   Food Insecurity Interventions Intervention Not Indicated  Housing Interventions Intervention Not Indicated  Transportation Interventions Intervention Not Indicated       Distress Screen completed: Yes ONCBCN DISTRESS SCREENING 09/01/2021  Screening Type Initial Screening  Distress experienced in past week (1-10) 5  Emotional problem type Nervousness/Anxiety  Information Concerns Type Lack of info about diagnosis;Lack of info about treatment;Lack of info about complementary therapy choices;Lack of info about maintaining fitness      Family/Social Information:  Housing Arrangement: patient lives with son Lexicographer) and grandson. Other son lives in [place] Family members/support persons in your life? Family Transportation concerns: no - sons help Employment: Unemployed. Income source: Supported by [employer] Financial concerns: Yes, due to illness and/or loss of work during treatment Type of concern: Medical bills Food access concerns: no Services Currently in place:  none  Coping/ Adjustment to diagnosis: Patient understands treatment plan and what happens next? yes, is nervous but main concern is paying for treatment Concerns about diagnosis and/or treatment: How I will pay for the services I need Patient reported stressors: Insurance Current coping skills/ strengths: Motivation for treatment/growth  and Supportive family/friends     SUMMARY: Current SDOH Barriers:  Lack of insurance  Clinical Social Work Clinical Goal(s):  Patient will connect with [facility] / [facility] financial  assistance  Interventions: Discussed the importance of support during treatment Informed patient of the support team roles and support services at [facility] Provided [place] contact information and encouraged patient to call with any questions or concerns CSW sent request to [place] team to explore [facility] Medicaid and/or [facility] financial assistance   Follow Up Plan: Patient will contact CSW with any support or resource needs Patient verbalizes understanding of plan: Yes    Christeen Douglas LCSW

## 2021-09-01 NOTE — Assessment & Plan Note (Addendum)
This is a very pleasant 66 year old female patient with past medical history significant for type 2 diabetes mellitus, hypertension who had an abnormal mammogram back in 2021 with 6 mm breast mass in the left breast at 930 o'clock as well as left abnormal axillary lymph nodes, did not undergo biopsy at that time as recommended who presented with abnormal mammogram again which led to further testing.  Left breast needle core biopsy at this time showed invasive ductal carcinoma, grade 2 along with DCIS, ER +80% strong intensity, PR +100% strong intensity, KI 5% and HER2 negative.  Lymph node biopsy was negative which was thought to be discordant.  She was presented in the breast [place] today for recommendations.  At this time given the unknown size of the tumor although the mass and calcs together measure almost about 3.7 cm in largest dimension, we have discussed about proceeding with upfront surgery.  Following surgery, if she has 1-3 positive sentinel lymph nodes involved along with tumor in the right breast larger than 5 mm, we should consider an Oncotype.  If she has more than 3 abnormal or positive lymph nodes on final pathology, then we will have to discuss adjuvant chemotherapy.  I have briefly talked to her about Oncotype today as mentioned below.  We have discussed about Oncotype Dx score which is a well validated prognostic scoring system which can predict outcome with endocrine therapy alone and whether chemotherapy reduces recurrence.  Typically in patients with ER positive cancers that are node negative if the RS score is high typically greater than or equal to 26, chemotherapy is recommended.   If she is not a candidate for chemotherapy, she will proceed with adjuvant radiation which will then be followed by antiestrogen therapy.  I have discussed about available antiestrogen therapy options, adverse effects including but postmenopausal symptoms, arthralgias, myalgias, bone loss with aromatase and  this.  We will plan to start her on anastrozole postradiation for antiestrogen therapy, recommended duration is a minimum of 5 years.  She had some expressed understanding of all the recommendations.  Son had all relevant questions which have been answered.  She should return to clinic to follow-up with [place] after surgery.

## 2021-09-01 NOTE — Progress Notes (Signed)
REFERRING PROVIDER: Benay Pike, MD [place], [place] [address]  PRIMARY PROVIDER:  [name], MD  PRIMARY REASON FOR VISIT:  1. 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 Family history of breast cancer     HISTORY OF PRESENT ILLNESS:   Kelli Vaughn, a 66 y.o. female, was seen for a [place] cancer genetics consultation during the breast multidisciplinary clinic at the request of Dr. Chryl Heck due to a personal and family history of cancer.  Kelli Vaughn presents to clinic today to discuss the possibility of a hereditary predisposition to cancer, to discuss genetic testing, and to further clarify her future cancer risks, as well as potential cancer risks for family members.   In February 2023, at the age of 59, Kelli Vaughn was diagnosed with invasive ductal carcinoma of the left breast.   CANCER HISTORY:  Oncology History Overview Note  Patient was called back for evaluation of LEFT breast calcifications, focal asymmetry, and possible enlarged LEFT axillary lymph node from screening mammogram at [place] on 05/20/2021. 08/12/2021, Suspicious mass in the 9:30 o'clock location of the LEFT breast warranting tissue diagnosis. Two LEFT axillary lymph nodes with abnormal morphology. 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 Lymph node biopsy of left axilla negative, lymph node biopsy results were thought to be discordant.   Patient does recollect being asked to schedule biopsy back in 2021 for abnormal mammogram but she was worried or had this misinformation that biopsy can let the breast cancer spread to other parts hence she decided not to proceed with biopsy at that time. She is here with her 2 sons, speaks in [place], [employer] interpreter present at the time of my visit.  Son also help with the  translation.   Malignant neoplasm of upper-inner quadrant of left breast in female, estrogen receptor positive ([place])  08/31/2021 Initial Diagnosis   Malignant neoplasm of upper-inner quadrant of left breast in female, estrogen receptor positive ([place])   09/01/2021 Cancer Staging   Staging form: Breast, AJCC 8th Edition - Clinical stage from 09/01/2021: Stage Unknown (cT1b, cNX, cM0, G2, ER+, PR+, HER2-) - Signed by Benay Pike, MD on 09/01/2021 Stage prefix: Initial diagnosis Histologic grading system: 3 grade system       RISK FACTORS:  Menarche was at age 50.  Ovaries intact: yes.  Uterus intact: yes.  Menopausal status: postmenopausal.  HRT use: 0 years. Colonoscopy: yes Mammogram within the last year: yes.  Past Medical History:  Diagnosis Date   Diabetes mellitus without complication ([place])    TYPE II   Glaucoma    Hypertension     Past Surgical History:  Procedure Laterality Date   CESAREAN SECTION     X2   GLAUCOMA SURGERY      Social History   Socioeconomic History   Marital status: Widowed    Spouse name: Not on file   Number of children: 2   Years of education: Not on file   Highest education level: Some college, no degree  Occupational History   Not on file  Tobacco Use   Smoking status: Never   Smokeless tobacco: Never  Vaping Use   Vaping Use: Never used  Substance and Sexual Activity   Alcohol use: No   Drug use: No   Sexual activity: Not Currently  Other Topics  Concern   Not on file  Social History Narrative   Not on file   Social Determinants of Health   Financial Resource Strain: Not on file  Food Insecurity: No Food Insecurity   Worried About Running Out of Food in the Last Year: Never true   Ran Out of Food in the Last Year: Never true  Transportation Needs: No Transportation Needs   Lack of Transportation (Medical): No   Lack of Transportation (Non-Medical): No  Physical Activity: Not on file  Stress: Not on file  Social  Connections: Not on file     FAMILY HISTORY:  We obtained a detailed, 4-generation family history.  Significant diagnoses are listed below: Family History  Problem Relation Age of Onset   Heart disease Mother    Bone cancer Father    Breast cancer Vaughn        dx. 57s   Stomach cancer Vaughn        dx. 17s, unsure if second primary or metastasis   Breast cancer Niece      Kelli Vaughn was diagnosed with breast cancer in he 93s, she was also diagnosed with stomach cancer but it is unknown if the stomach cancer was a second primary or metastasis, her Vaughn died in her 17s. This Vaughn's daughter has a history of breast cancer. Kelli Vaughn father was diagnosed with bone cancer in his knee at age 76, his leg was amputated, but the cancer still metastasized and he died one year later at age 61.  Kelli Vaughn is unaware of previous family history of genetic testing for hereditary cancer risks. There is no reported Ashkenazi Jewish ancestry.  GENETIC COUNSELING ASSESSMENT: Kelli Vaughn is a 66 y.o. female with a personal and family history of cancer which is somewhat suggestive of a hereditary cancer syndrome and predisposition to cancer. We, therefore, discussed and recommended the following at today's visit.   DISCUSSION: We discussed that 5 - 10% of cancer is hereditary, with most cases of hereditary breast cancer associated with mutations in BRCA1/2.  There are other genes that can be associated with hereditary breast cancer syndromes. Type of cancer risk and level of risk are gene-specific. We discussed that testing is beneficial for several reasons including knowing how to follow individuals after completing their treatment, identifying whether potential treatment options would be beneficial, and understanding if other family members could be at risk for cancer and allowing them to undergo genetic testing.   We reviewed the characteristics, features and inheritance patterns of hereditary  cancer syndromes. We also discussed genetic testing, including the appropriate family members to test, the process of testing, insurance coverage and turn-around-time for results. We discussed the implications of a negative, positive and/or variant of uncertain significant result. In order to get genetic test results in a timely manner so that Kelli Vaughn can use these genetic test results for surgical decisions, we recommended Kelli Vaughn pursue genetic testing for the BRCAplus. Once complete, we recommend Kelli Vaughn pursue reflex genetic testing to a more comprehensive gene panel.   Kelli Vaughn elected to have [place] Panel+RNA. The CancerNext gene panel offered by [employer] includes sequencing, rearrangement analysis, and RNA analysis for the following 36 genes:   APC, ATM, AXIN2, BARD1, BMPR1A, BRCA1, BRCA2, BRIP1, CDH1, CDK4, CDKN2A, CHEK2, DICER1, HOXB13, EPCAM, GREM1, MLH1, MSH2, MSH3, MSH6, MUTYH, NBN, NF1, NTHL1, PALB2, PMS2, POLD1, POLE, PTEN, RAD51C, RAD51D, RECQL, SMAD4, SMARCA4, STK11, and TP53.    Based on Kelli Vaughn's personal  and family history of cancer, she meets medical criteria for genetic testing. We will perform [place] testing for Kelli Vaughn to ensure her genetic testing is free.   PLAN: After considering the risks, benefits, and limitations, Kelli Vaughn provided informed consent to pursue genetic testing and the blood sample was sent to [facility] for analysis of the CancerNext Panel+RNA. Results should be available within approximately 1-2 weeks' time, at which point they will be disclosed by telephone to Kelli Vaughn, as will any additional recommendations warranted by these results. Kelli Vaughn will receive a summary of her genetic counseling visit and a copy of her results once available. This information will also be available in Epic.   Kelli Vaughn questions were answered to her satisfaction today. Our contact information was provided should additional questions or concerns  arise. Thank you for the referral and allowing [place] to share in the care of your patient.   Kelli Passy, MS, [facility] Genetic Counselor [email or web address] (P) [phone]  The patient was seen for a total of 20 minutes in face-to-face genetic counseling.  The patient brought her sons. Drs. Lindi Adie and/or Burr Medico were available to discuss this case as needed.  _______________________________________________________________________ For Office Staff:  Number of people involved in session: 3 Was an Intern/ student involved with case: no

## 2021-09-02 ENCOUNTER — Other Ambulatory Visit: Payer: Self-pay | Admitting: General Surgery

## 2021-09-02 ENCOUNTER — Encounter: Payer: Self-pay | Admitting: Genetic Counselor

## 2021-09-02 ENCOUNTER — Telehealth: Payer: Self-pay | Admitting: Hematology and Oncology

## 2021-09-02 DIAGNOSIS — C50212 Malignant neoplasm of upper-inner quadrant of left female breast: Secondary | ICD-10-CM

## 2021-09-02 DIAGNOSIS — Z170 Estrogen receptor positive status [ER+]: Secondary | ICD-10-CM

## 2021-09-02 NOTE — Telephone Encounter (Signed)
Scheduled appointment per 02/22 los. Patient aware.  °

## 2021-09-03 ENCOUNTER — Ambulatory Visit (HOSPITAL_COMMUNITY): Payer: No Typology Code available for payment source

## 2021-09-06 ENCOUNTER — Encounter: Payer: Self-pay | Admitting: Hematology and Oncology

## 2021-09-06 NOTE — Progress Notes (Signed)
Received CFA(Financial Assistance) application in my mailbox and letter of support. Letter of support can also be used for [employer]. Will connect with patient at next appointment to approve.  Emailed CFA to hospital financial navigator(L. August Albino) to scan into chart to be processed.  Customer service will process application and reach out to patient via mail if anything else is needed and with determination. It normally takes 3 weeks or more to process.

## 2021-09-07 ENCOUNTER — Telehealth: Payer: Self-pay

## 2021-09-07 NOTE — Telephone Encounter (Signed)
Via Lavon Paganini, Spanish Interpreter [facility]), Patient was contacted to verify [place] citizenship/resident status to determine if she is eligible to apply for [employer] Medicaid due to diagnosis of breast cancer. Patient stated that she was not a citizen or a resident, [employer] application mailed to patient.

## 2021-09-08 ENCOUNTER — Other Ambulatory Visit: Payer: Self-pay

## 2021-09-08 ENCOUNTER — Other Ambulatory Visit: Payer: Self-pay | Admitting: *Deleted

## 2021-09-08 DIAGNOSIS — Z124 Encounter for screening for malignant neoplasm of cervix: Secondary | ICD-10-CM

## 2021-09-08 NOTE — Progress Notes (Signed)
Patient: Kelli Vaughn           ?Date of Birth: [birth year]           ?MRN: [id number] ?Visit Date: 09/08/2021 ?PCP: [name], MD ? ?Cervical Cancer Screening ?Do you smoke?: No ?Have you ever had or been told you have an allergy to latex products?: No ?Marital status: Divorce ?Date of last pap smear: 2-5 yrs ago (08/29/2016 Pap/HPV-Negative [facility] [place], [place])) ?Date of last menstrual period:  (Postmenopausal) ?Number of pregnancies: 3 ?Number of births: 2 ?Have you ever had any of the following? ?Hysterectomy: No ?Tubal ligation (tubes tied): No ?Abnormal bleeding: No ?Abnormal pap smear: No ?Venereal warts: No ?A sex partner with venereal warts: No ?A high risk* sex partner: No ? ?Cervical Exam ? ?Abnormal Observations: Normal Exam ?Recommendations: Last Pap smear was 08/25/2016 at [facility] clinic and was normal with negative HPV. Per patient has no history of an abnormal Pap smear. Last Pap smear result is available in Epic. Let patient know if today's Pap smear is normal and HPV negative that her next Pap smear will be due in 5 years. Informed patient that will follow up with her within the next couple of weeks with results of her Pap smear by phone. Patient verbalized understanding. ? ? ?Spanish interpreter Rudene Anda from [facility] provided. ? ?Patient's History ?Patient Active Problem List  ? Diagnosis Date Noted  ? Family history of breast cancer 09/01/2021  ? Malignant neoplasm of upper-inner quadrant of left breast in female, estrogen receptor positive ([place]) 08/31/2021  ? ?Past Medical History:  ?Diagnosis Date  ? Diabetes mellitus without complication ([place])   ? TYPE II  ? Glaucoma   ? Hypertension   ?  ?Family History  ?Problem Relation Age of Onset  ? Heart disease Mother   ? Bone cancer Father   ? Breast cancer Sister   ?     dx. 75s  ? Stomach cancer Sister   ?     dx. 58s, unsure if second primary or metastasis  ? Breast cancer Niece   ?  ?Social  History  ? ?Occupational History  ? Not on file  ?Tobacco Use  ? Smoking status: Never  ? Smokeless tobacco: Never  ?Vaping Use  ? Vaping Use: Never used  ?Substance and Sexual Activity  ? Alcohol use: No  ? Drug use: No  ? Sexual activity: Not Currently  ? ?

## 2021-09-09 ENCOUNTER — Encounter: Payer: Self-pay | Admitting: *Deleted

## 2021-09-09 LAB — CYTOLOGY - PAP
Comment: NEGATIVE
Diagnosis: NEGATIVE
High risk HPV: NEGATIVE

## 2021-09-13 ENCOUNTER — Telehealth: Payer: Self-pay

## 2021-09-13 NOTE — Telephone Encounter (Signed)
Called patient via [employer] [phone] to give pap smear results. Informed patient that pap smear was normal and HPV was negative. Based on this result and her age her next pap smear will be due based on PCP's recommendation. Patient voiced understanding.  ?

## 2021-09-14 NOTE — Progress Notes (Addendum)
Surgical Instructions    Your procedure is scheduled on Tuesday March 14th.  Report to [facility] Main Entrance "A" at 1:30 P.M., then check in with the Admitting office.  Call this number if you have problems the morning of surgery:  [phone]   If you have any questions prior to your surgery date call [phone]: Open Monday-Friday 8am-4pm    Remember:  Do not eat after midnight the night before your surgery  You may drink clear liquids until 12:30 pm the afternoon of your surgery.   Clear liquids allowed are: Water, Non-Citrus Juices (without pulp), Carbonated Beverages, Clear Tea, Black Coffee ONLY (NO MILK, CREAM OR POWDERED CREAMER of any kind), and Gatorade    Take these medicines the morning of surgery with A SIP OF WATER: None    Follow your surgeon's instructions on when to stop Aspirin.  If no instructions were given by your surgeon then you will need to call the office to get those instructions.     As of today, STOP taking any Aspirin (unless otherwise instructed by your surgeon) Aleve, Naproxen, Ibuprofen, Motrin, Advil, Goody's, BC's, all herbal medications, fish oil, and all vitamins.  WHAT DO I DO ABOUT MY DIABETES MEDICATION?   Do not take oral diabetes medicines (Glipizide/Janumet) the morning of surgery.  Do not take your Janumet the day before surgery.   Day before surgery : Do not take any glipizide the evening before surgery, the morning dose of glipizide is ok to take.   THE NIGHT BEFORE SURGERY, take_half of your Lantus dose - 17   units of _Lantus_insulin.       THE MORNING OF SURGERY,  take_half of your Lantus dose - 17   units of _Lantus_insulin.    HOW TO MANAGE YOUR DIABETES BEFORE AND AFTER SURGERY  Why is it important to control my blood sugar before and after surgery? Improving blood sugar levels before and after surgery helps healing and can limit problems. A way of improving blood sugar control is eating a healthy diet by:   Eating less sugar and carbohydrates  Increasing activity/exercise  Talking with your doctor about reaching your blood sugar goals High blood sugars (greater than 180 mg/dL) can raise your risk of infections and slow your recovery, so you will need to focus on controlling your diabetes during the weeks before surgery. Make sure that the doctor who takes care of your diabetes knows about your planned surgery including the date and location.  How do I manage my blood sugar before surgery? Check your blood sugar at least 4 times a day, starting 2 days before surgery, to make sure that the level is not too high or low.  Check your blood sugar the morning of your surgery when you wake up and every 2 hours until you get to the Short Stay unit.  If your blood sugar is less than 70 mg/dL, you will need to treat for low blood sugar: Do not take insulin. Treat a low blood sugar (less than 70 mg/dL) with  cup of clear juice (cranberry or apple), 4 glucose tablets, OR glucose gel. Recheck blood sugar in 15 minutes after treatment (to make sure it is greater than 70 mg/dL). If your blood sugar is not greater than 70 mg/dL on recheck, call [phone] for further instructions. Report your blood sugar to the short stay nurse when you get to Short Stay.  If you are admitted to the hospital after surgery: Your blood sugar will be checked  by the staff and you will probably be given insulin after surgery (instead of oral diabetes medicines) to make sure you have good blood sugar levels. The goal for blood sugar control after surgery is 80-180 mg/dL. to          Do not wear jewelry or makeup Do not wear lotions, powders, perfumes, or deodorant. Do not shave 48 hours prior to surgery.   Do not bring valuables to the hospital. Do not wear nail polish, gel polish, artificial nails, or any other type of covering on natural nails (fingers and toes) If you have artificial nails or gel coating that need to be  removed by a nail salon, please have this removed prior to surgery. Artificial nails or gel coating may interfere with anesthesia's ability to adequately monitor your vital signs.  [facility] is not responsible for any belongings or valuables. .   Do NOT Smoke (Tobacco/Vaping)  24 hours prior to your procedure  If you use a CPAP at night, you may bring your mask for your overnight stay.   Contacts, glasses, hearing aids, dentures or partials may not be worn into surgery, please bring cases for these belongings   For patients admitted to the hospital, discharge time will be determined by your treatment team.   Patients discharged the day of surgery will not be allowed to drive home, and someone needs to stay with them for 24 hours.  NO VISITORS WILL BE ALLOWED IN PRE-OP WHERE PATIENTS ARE PREPPED FOR SURGERY.  ONLY 1 SUPPORT PERSON MAY BE PRESENT IN THE WAITING ROOM WHILE YOU ARE IN SURGERY.  IF YOU ARE TO BE ADMITTED, ONCE YOU ARE IN YOUR ROOM YOU WILL BE ALLOWED TWO (2) VISITORS. 1 (ONE) VISITOR MAY STAY OVERNIGHT BUT MUST ARRIVE TO THE ROOM BY 8pm.  Minor children may have two parents present. Special consideration for safety and communication needs will be reviewed on a case by case basis.  Special instructions:    Oral Hygiene is also important to reduce your risk of infection.  Remember - BRUSH YOUR TEETH THE MORNING OF SURGERY WITH YOUR REGULAR TOOTHPASTE   [facility]- Preparing For Surgery  Before surgery, you can play an important role. Because skin is not sterile, your skin needs to be as free of germs as possible. You can reduce the number of germs on your skin by washing with CHG (chlorahexidine gluconate) Soap before surgery.  CHG is an antiseptic cleaner which kills germs and bonds with the skin to continue killing germs even after washing.     Please do not use if you have an allergy to CHG or antibacterial soaps. If your skin becomes reddened/irritated stop using the CHG.   Do not shave (including legs and underarms) for at least 48 hours prior to first CHG shower. It is OK to shave your face.  Please follow these instructions carefully.     Shower the NIGHT BEFORE SURGERY and the MORNING OF SURGERY with CHG Soap.   If you chose to wash your hair, wash your hair first as usual with your normal shampoo. After you shampoo, rinse your hair and body thoroughly to remove the shampoo.  Then [employer] and genitals (private parts) with your normal soap and rinse thoroughly to remove soap.  After that Use CHG Soap as you would any other liquid soap. You can apply CHG directly to the skin and wash gently with a scrungie or a clean washcloth.   Apply the CHG Soap  to your body ONLY FROM THE NECK DOWN.  Do not use on open wounds or open sores. Avoid contact with your eyes, ears, mouth and genitals (private parts). Wash Face and genitals (private parts)  with your normal soap.   Wash thoroughly, paying special attention to the area where your surgery will be performed.  Thoroughly rinse your body with warm water from the neck down.  DO NOT shower/wash with your normal soap after using and rinsing off the CHG Soap.  Pat yourself dry with a CLEAN TOWEL.  Wear CLEAN PAJAMAS to bed the night before surgery  Place CLEAN SHEETS on your bed the night before your surgery  DO NOT SLEEP WITH PETS.   Day of Surgery:  Take a shower with CHG soap. Wear Clean/Comfortable clothing the morning of surgery Do not apply any deodorants/lotions.   Remember to brush your teeth WITH YOUR REGULAR TOOTHPASTE.    COVID testing  If you are going to stay overnight or be admitted after your procedure/surgery and require a pre-op COVID test, please follow these instructions after your COVID test   You are not required to quarantine however you are required to wear a well-fitting mask when you are out and around people not in your household.  If your mask becomes wet or soiled, replace  with a new one.  Wash your hands often with soap and water for 20 seconds or clean your hands with an alcohol-based hand sanitizer that contains at least 60% alcohol.  Do not share personal items.  Notify your provider: if you are in close contact with someone who has COVID  or if you develop a fever of 100.4 or greater, sneezing, cough, sore throat, shortness of breath or body aches.    Please read over the following fact sheets that you were given.

## 2021-09-15 ENCOUNTER — Other Ambulatory Visit: Payer: Self-pay

## 2021-09-15 ENCOUNTER — Encounter (HOSPITAL_COMMUNITY): Payer: Self-pay

## 2021-09-15 ENCOUNTER — Encounter (HOSPITAL_COMMUNITY)
Admission: RE | Admit: 2021-09-15 | Discharge: 2021-09-15 | Disposition: A | Discharge status: Home / Self Care | Payer: No Typology Code available for payment source | Source: Ambulatory Visit | Attending: General Surgery | Admitting: General Surgery

## 2021-09-15 VITALS — BP 137/61 | HR 87 | Temp 98.4°F | Resp 18 | Ht 60.0 in | Wt 197.6 lb

## 2021-09-15 DIAGNOSIS — E119 Type 2 diabetes mellitus without complications: Secondary | ICD-10-CM | POA: Insufficient documentation

## 2021-09-15 DIAGNOSIS — Z170 Estrogen receptor positive status [ER+]: Secondary | ICD-10-CM | POA: Insufficient documentation

## 2021-09-15 DIAGNOSIS — Z01818 Encounter for other preprocedural examination: Secondary | ICD-10-CM | POA: Insufficient documentation

## 2021-09-15 DIAGNOSIS — C50212 Malignant neoplasm of upper-inner quadrant of left female breast: Secondary | ICD-10-CM | POA: Insufficient documentation

## 2021-09-15 HISTORY — DX: Nausea with vomiting, unspecified: R11.2

## 2021-09-15 HISTORY — DX: Other specified postprocedural states: Z98.890

## 2021-09-15 HISTORY — DX: Other complications of anesthesia, initial encounter: T88.59XA

## 2021-09-15 LAB — BASIC METABOLIC PANEL
Anion gap: 8 (ref 5–15)
BUN: 18 mg/dL (ref 8–23)
CO2: 28 mmol/L (ref 22–32)
Calcium: 10.3 mg/dL (ref 8.9–10.3)
Chloride: 100 mmol/L (ref 98–111)
Creatinine, Ser: 0.58 mg/dL (ref 0.44–1.00)
GFR, Estimated: >60 mL/min (ref >60)
Glucose, Bld: 182 mg/dL — ABNORMAL HIGH (ref 70–99)
Potassium: 4.4 mmol/L (ref 3.5–5.1)
Sodium: 136 mmol/L (ref 135–145)

## 2021-09-15 LAB — CBC
HCT: 41.6 % (ref 36.0–46.0)
Hemoglobin: 13.6 g/dL (ref 12.0–15.0)
MCH: 29.9 pg (ref 26.0–34.0)
MCHC: 32.7 g/dL (ref 30.0–36.0)
MCV: 91.4 fL (ref 80.0–100.0)
Platelets: 324 10*3/uL (ref 150–400)
RBC: 4.55 MIL/uL (ref 3.87–5.11)
RDW: 13.2 % (ref 11.5–15.5)
WBC: 11.8 10*3/uL — ABNORMAL HIGH (ref 4.0–10.5)
nRBC: 0 % (ref 0.0–0.2)

## 2021-09-15 LAB — GLUCOSE, CAPILLARY: Glucose-Capillary: 209 mg/dL — ABNORMAL HIGH (ref 70–99)

## 2021-09-15 LAB — HEMOGLOBIN A1C
Hgb A1c MFr Bld: 7.5 % — ABNORMAL HIGH (ref 4.8–5.6)
Mean Plasma Glucose: 168.55 mg/dL

## 2021-09-15 NOTE — Progress Notes (Addendum)
PCP - [name] / Patient states she went to a clinic in [place] she believes she saw Dr. Solmon Ice. Patient does not know the last name. Saw in [place] with [facility]  ?Cardiologist - Denies ? ?Chest x-ray - Not indicated ?EKG - 09/15/21 ?Stress Test - Denies ?ECHO - Denies ?Cardiac Cath - Denies ? ?Sleep Study - Yes has OSA ?CPAP - Has CPAP cannot tolerate therefore doesn't use ? ?DM - type II ?Fasting Blood Sugar - This morning she checked and it was 65 ?Checks Blood Sugar __checks when she doesn't feel well ?CBG at PAT appt 209. Had something around 0830 this am (2 eggs, sweet potato, yogurt, and 1/2 cup coffee) ? ?Aspirin Instructions: Calling Dr. Marlowe Aschoff office to ask when to stop. 1143 09/15/21 Spoke to [place] at [facility] and patient is to stop Aspirin, last dose 09/14/21, until after surgery.  ? ?ERAS Protcol - Yes ?PRE-SURGERY  G2-  given ? ?COVID TEST- Not indicated ? ? ?Anesthesia review: No ? ?Patient denies shortness of breath, fever, cough and chest pain at PAT appointment ? ? ?All instructions explained to the patient, with a verbal understanding of the material. Patient agrees to go over the instructions while at home for a better understanding.  The opportunity to ask questions was provided. ? ? ?

## 2021-09-17 ENCOUNTER — Telehealth: Payer: Self-pay | Admitting: Genetic Counselor

## 2021-09-17 ENCOUNTER — Encounter: Payer: Self-pay | Admitting: Genetic Counselor

## 2021-09-17 DIAGNOSIS — Z1379 Encounter for other screening for genetic and chromosomal anomalies: Secondary | ICD-10-CM | POA: Insufficient documentation

## 2021-09-17 NOTE — Telephone Encounter (Signed)
I contacted [name] to discuss her genetic testing results. No pathogenic variants were identified in the first 8 genes analyzed. 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 We are still waiting on additional genes and will contact her when they are available. Detailed clinic note to follow. ? ?The test report has been scanned into EPIC and is located under the Molecular Pathology section of the Results Review tab.  A portion of the result report is included below for reference.  ? ?Kelli Passy, MS, [place] ?Genetic Counselor ?[email or web address] ?(P) [phone] ? ? ?

## 2021-09-20 ENCOUNTER — Ambulatory Visit
Admission: RE | Admit: 2021-09-20 | Discharge: 2021-09-20 | Disposition: A | Discharge status: Home / Self Care | Payer: No Typology Code available for payment source | Source: Ambulatory Visit | Attending: General Surgery | Admitting: General Surgery

## 2021-09-20 ENCOUNTER — Other Ambulatory Visit: Payer: Self-pay | Admitting: General Surgery

## 2021-09-20 DIAGNOSIS — Z170 Estrogen receptor positive status [ER+]: Secondary | ICD-10-CM

## 2021-09-20 DIAGNOSIS — C50212 Malignant neoplasm of upper-inner quadrant of left female breast: Secondary | ICD-10-CM

## 2021-09-20 NOTE — H&P (Signed)
REFERRING PHYSICIAN: Everlean Alstrom, MD  PROVIDER: Georgianne Fick, MD  Care Team: Patient Care Team: Other as PCP - General Barry Dienes Ballard Russell, MD as Consulting Provider (Surgical Oncology) Iruku, Flo Shanks, MD (Hematology and Oncology) Marye Round, MD (Radiation Oncology)   MRN: [id number] DOB: 01/19/1956 DATE OF ENCOUNTER: 09/01/2021  Subjective   Chief Complaint: Breast Cancer  Visit conducted with medical interpreter present as well as two sons.   History of Present Illness: Kelli Vaughn is a 66 y.o. female who is seen today as an office consultation at the request of Dr. Shelly Bombard for evaluation of Breast Cancer  Pt has new diagnosis of left breast cancer 08/2021. She presents with abnormal LEFT screening mammogram. A possible mass and calcifications were seen. Diagnostic imaging showed 3.7 cm of calcs + mass. The ultrasound shows a 7 mm mass. She also had two abnormal lymph nodes in her axilla. Core needle biopsies were done. 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 Prognostic panel was Er/PR positive, Her 2 negative, and Ki 67 5%. The larger lymph node was felt to be discordant. She had been recommended to get a biopsy in 2021, but was not able to. There is not a significant change in the appearance on imaging.   She has not personally had cancer before, but she had a sister with breast cancer in her 76s. Her father also had "knee cancer." She is a Advertising copywriter. She had menarche at age 52. Menopause was around age 59. She still has an IUD.    Diagnostic mammogram/us :08/12/21  ACR Breast Density Category b: There are scattered areas of fibroglandular density.   FINDINGS: Spot compression and magnified views are performed of the LEFT breast. These views demonstrate a spiculated mass associated with linear and pleomorphic calcifications in the UPPER INNER QUADRANT of the LEFT breast. The calcifications and mass together span 3.7 x  1.8 x 1.2 centimeters.   Targeted ultrasound is performed, showing an oval mass with indistinct margins and posterior acoustic shadowing in the 9:30 o'clock location of the LEFT breast 5 centimeters from the nipple. No internal blood flow identified on Doppler evaluation. Mass measures 0.7 x 0.9 x 0.5 centimeters. At real-time imaging, there is associated distortion in this region.   Evaluation of the LEFT axilla shows 2 morphologically abnormal lymph nodes, 1 with cortical thickening of 1.0 centimeters. A second lymph node has cortical thickening of 3 millimeters. Other LEFT axillary lymph nodes have normal morphology.   IMPRESSION: 1. Suspicious mass in the 9:30 o'clock location of the LEFT breast warranting tissue diagnosis. 2. Two LEFT axillary lymph nodes with abnormal morphology.   RECOMMENDATION: 1. Recommend ultrasound-guided core biopsy of mass in the 9:30 o'clock location of the LEFT breast. Given the associated suspicious calcifications in this region, I would recommend bracketed localization to include calcifications if excision is needed. 2. Recommend ultrasound-guided core biopsy of the larger of the LEFT axillary lymph nodes.   I have discussed the findings and recommendations with the patient with the assistance of an interpreter. If applicable, a reminder letter will be sent to the patient regarding the next appointment.   BI-RADS CATEGORY 5: Highly suggestive of malignancy.  Pathology core needle biopsy: 08/26/21 1. Breast, left, needle core biopsy, left breast 9:30 mass, ribbon clip - INVASIVE DUCTAL CARCINOMA - DUCTAL CARCINOMA IN SITU - CALCIFICATIONS ASSOCIATED WITH CARCINOMA - SEE COMMENT 2. Lymph node, needle/core biopsy, left axilla, lymph node, tribell clips - NO CARCINOMA IDENTIFIED  IN NODAL TISSUE 1. Based on the biopsy, the carcinoma appears Nottingham grade 2 of 3 and measures 0.5 cm in greatest linear extent.  Receptors: The tumor cells are  NEGATIVE for Her2 (1+). Estrogen Receptor: 80%, POSITIVE, STRONG STAINING INTENSITY Progesterone Receptor: 100%, POSITIVE, STRONG STAINING INTENSITY Proliferation Marker Ki67: 5%  Review of Systems: A complete review of systems was obtained from the patient. I have reviewed this information and discussed as appropriate with the patient. See HPI as well for other ROS.  Review of Systems  All other systems reviewed and are negative.   Medical History: Past Medical History:  Diagnosis Date   Arthritis   Diabetes mellitus without complication (CMS-HCC)   GERD (gastroesophageal reflux disease)   Glaucoma (increased eye pressure)   Hypertension   Patient Active Problem List  Diagnosis   Malignant neoplasm of upper-inner quadrant of left breast in female, estrogen receptor positive (CMS-HCC)   Age-related nuclear cataract of both eyes   Migraine without aura and without status migrainosus, not intractable   Itching   Bleeding from the nose   Leg swelling   Heartburn   Constipation   Weakness   Bleeds easily (CMS-HCC)   Easy bruising   Family history of breast cancer   Past Surgical History:  Procedure Laterality Date   CESAREAN SECTION   GLAUCOMA EYE SURGERY    No Known Allergies  Current Outpatient Medications on File Prior to Visit  Medication Sig Dispense Refill   aspirin 81 MG EC tablet Take 81 mg by mouth once daily   bimatoprost (LUMIGAN) 0.01 % ophthalmic solution Apply to eye   glipiZIDE (GLUCOTROL) 5 MG tablet Take 5 mg by mouth once daily   ibuprofen (MOTRIN) 800 MG tablet Take by mouth   lisinopriL (ZESTRIL) 20 MG tablet Take 20 mg by mouth once daily   ranitidine (ZANTAC) 300 MG capsule Take by mouth   sitaGLIPtin-metFORMIN (JANUMET) 50-1,000 mg tablet Take by mouth   No current facility-administered medications on file prior to visit.   Family History  Problem Relation Age of Onset   Hyperlipidemia (Elevated cholesterol) Mother   Coronary Artery  Disease (Blocked arteries around heart) Mother   High blood pressure (Hypertension) Father   Breast cancer Sister   Hyperlipidemia (Elevated cholesterol) Brother   Diabetes Brother    Social History   Tobacco Use  Smoking Status Never  Smokeless Tobacco Never    Social History   Socioeconomic History   Marital status: Unknown  Tobacco Use   Smoking status: Never   Smokeless tobacco: Never  Vaping Use   Vaping Use: Never used  Substance and Sexual Activity   Alcohol use: Not Currently   Drug use: Never   Objective:   Vitals:  09/01/21 1145  BP: (!) 151/61  Pulse: 89  Resp: 16  Temp: 36.6 C (97.9 F)  Weight: 89.1 kg (196 lb 6.4 oz)  Height: 152.4 cm (5')   Body mass index is 38.36 kg/m.  Gen: No acute distress. Well nourished and well groomed.  Neurological: Alert and oriented to person, place, and time. Coordination normal.  Head: Normocephalic and atraumatic.  Eyes: Conjunctivae are normal. Pupils are equal, round, and reactive to light. No scleral icterus.  Neck: Normal range of motion. Neck supple. No tracheal deviation or thyromegaly present.  Cardiovascular: Normal rate, regular rhythm, normal heart sounds and intact distal pulses. Exam reveals no gallop and no friction rub. No murmur heard. Breast: breasts are relatively symmetric. She has mild to  moderate ptosis. There is bruising upper left breast. There is no palpable mass. There is a faintly palpable node in the axilla. No nipple retraction or nipple discharge. She has no skin dimpling. Right breast normal.  Respiratory: Effort normal. No respiratory distress. No chest wall tenderness. Breath sounds normal. No wheezes, rales or rhonchi.  GI: Soft. Bowel sounds are normal. The abdomen is soft and nontender. There is no rebound and no guarding.  Musculoskeletal: Normal range of motion. Extremities are nontender.  Lymphadenopathy: No cervical, preauricular, postauricular or axillary adenopathy is  present Skin: Skin is warm and dry. No rash noted. No diaphoresis. No erythema. No pallor. No clubbing, cyanosis, or edema.  Psychiatric: Normal mood and affect. Behavior is normal. Judgment and thought content normal.   Labs CMET and CBC 09/01/21 essentially normal other than WBCs 11.7k  Assessment and Plan:   Malignant neoplasm of upper-inner quadrant of left breast in female, estrogen receptor positive (CMS-HCC) Pt has a new diagnosis of cT1-2N0-1 left breast cancer.   Pt is a candidate for lumpectomy. It appears likely that the invasive component is likely under 1 cm and the surrounding tissue is DCIS. We would need to bracket this for optimal resection. Given the discordant node, this will also need a seed. SLN would also be performed.   This would be followed by possible oncotype/mammaprint, radiation, and antihormonal tx.  Interpreter used for discussion and exam. The surgical procedure was described to the patient. I discussed the incision type and location and that we would need radiology involved on with multiple seed markers. I discussed the pectoral block and nipple injection for the sentinel node biopsy.   The risks and benefits of the procedure were described to the patient and she wishes to proceed.   We discussed the risks bleeding, infection, damage to other structures, need for further procedures/surgeries. We discussed the risk of seroma. The patient was advised if the area in the breast in cancer, we may need to go back to surgery for additional tissue to obtain negative margins or for a lymph node biopsy. The patient was advised that these are the most common complications, but that others can occur as well. They were advised against taking aspirin or other anti-inflammatory agents/blood thinners the week before surgery.   Family history of breast cancer Given young age of sister with breast cancer, patient is receiving genetic counseling and testing today.  Epic orders  and posting sheet sent.   Milus Height, MD FACS Surgical Oncology, General Surgery, Trauma and [facility]

## 2021-09-21 ENCOUNTER — Ambulatory Visit (HOSPITAL_COMMUNITY): Payer: No Typology Code available for payment source | Admitting: Certified Registered Nurse Anesthetist

## 2021-09-21 ENCOUNTER — Ambulatory Visit
Admission: RE | Admit: 2021-09-21 | Discharge: 2021-09-21 | Disposition: A | Discharge status: Home / Self Care | Payer: No Typology Code available for payment source | Source: Ambulatory Visit | Attending: General Surgery | Admitting: General Surgery

## 2021-09-21 ENCOUNTER — Encounter (HOSPITAL_COMMUNITY): Payer: Self-pay | Admitting: General Surgery

## 2021-09-21 ENCOUNTER — Ambulatory Visit (HOSPITAL_COMMUNITY)
Admission: RE | Admit: 2021-09-21 | Discharge: 2021-09-21 | Disposition: A | Discharge status: Home / Self Care | Payer: No Typology Code available for payment source | Attending: General Surgery | Admitting: General Surgery

## 2021-09-21 ENCOUNTER — Other Ambulatory Visit: Payer: Self-pay

## 2021-09-21 ENCOUNTER — Ambulatory Visit (HOSPITAL_BASED_OUTPATIENT_CLINIC_OR_DEPARTMENT_OTHER): Payer: No Typology Code available for payment source | Admitting: Certified Registered Nurse Anesthetist

## 2021-09-21 ENCOUNTER — Encounter (HOSPITAL_COMMUNITY)
Admission: RE | Disposition: A | Discharge status: Home / Self Care | Payer: Self-pay | Source: Home / Self Care | Attending: General Surgery

## 2021-09-21 DIAGNOSIS — C50212 Malignant neoplasm of upper-inner quadrant of left female breast: Secondary | ICD-10-CM

## 2021-09-21 DIAGNOSIS — Z6839 Body mass index (BMI) 39.0-39.9, adult: Secondary | ICD-10-CM | POA: Insufficient documentation

## 2021-09-21 DIAGNOSIS — Z794 Long term (current) use of insulin: Secondary | ICD-10-CM | POA: Insufficient documentation

## 2021-09-21 DIAGNOSIS — Z803 Family history of malignant neoplasm of breast: Secondary | ICD-10-CM | POA: Insufficient documentation

## 2021-09-21 DIAGNOSIS — E669 Obesity, unspecified: Secondary | ICD-10-CM | POA: Insufficient documentation

## 2021-09-21 DIAGNOSIS — E119 Type 2 diabetes mellitus without complications: Secondary | ICD-10-CM | POA: Insufficient documentation

## 2021-09-21 DIAGNOSIS — Z170 Estrogen receptor positive status [ER+]: Secondary | ICD-10-CM | POA: Insufficient documentation

## 2021-09-21 DIAGNOSIS — C50912 Malignant neoplasm of unspecified site of left female breast: Secondary | ICD-10-CM

## 2021-09-21 DIAGNOSIS — I10 Essential (primary) hypertension: Secondary | ICD-10-CM

## 2021-09-21 DIAGNOSIS — Z87891 Personal history of nicotine dependence: Secondary | ICD-10-CM

## 2021-09-21 LAB — GLUCOSE, CAPILLARY
Glucose-Capillary: 120 mg/dL — ABNORMAL HIGH (ref 70–99)
Glucose-Capillary: 93 mg/dL (ref 70–99)

## 2021-09-21 SURGERY — BREAST LUMPECTOMY WITH RADIOACTIVE SEED AND SENTINEL LYMPH NODE BIOPSY
Anesthesia: Regional | Site: Breast | Laterality: Left

## 2021-09-21 MED ORDER — MIDAZOLAM HCL 2 MG/2ML IJ SOLN
INTRAMUSCULAR | Status: AC
  Administered 2021-09-21: 1 mg via INTRAVENOUS
  Filled 2021-09-21: qty 2

## 2021-09-21 MED ORDER — LACTATED RINGERS IV SOLN: INTRAVENOUS | Status: DC

## 2021-09-21 MED ORDER — ONDANSETRON HCL 4 MG/2ML IJ SOLN
INTRAMUSCULAR | Status: DC | PRN
  Administered 2021-09-21: 4 mg via INTRAVENOUS

## 2021-09-21 MED ORDER — PROPOFOL 10 MG/ML IV BOLUS
INTRAVENOUS | Status: DC | PRN
  Administered 2021-09-21: 150 mg via INTRAVENOUS

## 2021-09-21 MED ORDER — CHLORHEXIDINE GLUCONATE CLOTH 2 % EX PADS: 6.0000 | MEDICATED_PAD | Freq: Once | CUTANEOUS | Status: DC

## 2021-09-21 MED ORDER — 0.9 % SODIUM CHLORIDE (POUR BTL) OPTIME
TOPICAL | Status: DC | PRN
  Administered 2021-09-21: 1000 mL

## 2021-09-21 MED ORDER — FENTANYL CITRATE (PF) 100 MCG/2ML IJ SOLN
25.0000 ug | INTRAMUSCULAR | Status: DC | PRN
  Administered 2021-09-21 (×2): 50 ug via INTRAVENOUS

## 2021-09-21 MED ORDER — PHENYLEPHRINE HCL (PRESSORS) 10 MG/ML IV SOLN
INTRAVENOUS | Status: AC
  Filled 2021-09-21: qty 1

## 2021-09-21 MED ORDER — MIDAZOLAM HCL 2 MG/2ML IJ SOLN: 1.0000 mg | Freq: Once | INTRAMUSCULAR | Status: AC

## 2021-09-21 MED ORDER — FENTANYL CITRATE (PF) 100 MCG/2ML IJ SOLN
INTRAMUSCULAR | Status: DC | PRN
  Administered 2021-09-21: 50 ug via INTRAVENOUS

## 2021-09-21 MED ORDER — ENSURE PRE-SURGERY PO LIQD: 296.0000 mL | Freq: Once | ORAL | Status: DC

## 2021-09-21 MED ORDER — LIDOCAINE 2% (20 MG/ML) 5 ML SYRINGE
INTRAMUSCULAR | Status: DC | PRN
  Administered 2021-09-21: 20 mg via INTRAVENOUS

## 2021-09-21 MED ORDER — ACETAMINOPHEN 500 MG PO TABS
1000.0000 mg | ORAL_TABLET | ORAL | Status: AC
  Administered 2021-09-21: 1000 mg via ORAL
  Filled 2021-09-21: qty 2

## 2021-09-21 MED ORDER — BUPIVACAINE-EPINEPHRINE (PF) 0.25% -1:200000 IJ SOLN
INTRAMUSCULAR | Status: AC
  Filled 2021-09-21: qty 30

## 2021-09-21 MED ORDER — FENTANYL CITRATE (PF) 250 MCG/5ML IJ SOLN
INTRAMUSCULAR | Status: AC
  Filled 2021-09-21: qty 5

## 2021-09-21 MED ORDER — DEXAMETHASONE SODIUM PHOSPHATE 10 MG/ML IJ SOLN
INTRAMUSCULAR | Status: DC | PRN
  Administered 2021-09-21: 5 mg

## 2021-09-21 MED ORDER — CHLORHEXIDINE GLUCONATE 0.12 % MT SOLN
15.0000 mL | Freq: Once | OROMUCOSAL | Status: AC
  Administered 2021-09-21: 15 mL via OROMUCOSAL
  Filled 2021-09-21: qty 15

## 2021-09-21 MED ORDER — FENTANYL CITRATE (PF) 100 MCG/2ML IJ SOLN
INTRAMUSCULAR | Status: AC
  Filled 2021-09-21: qty 2

## 2021-09-21 MED ORDER — SCOPOLAMINE 1 MG/3DAYS TD PT72
MEDICATED_PATCH | TRANSDERMAL | Status: AC
  Administered 2021-09-21: 1.5 mg via TRANSDERMAL
  Filled 2021-09-21: qty 1

## 2021-09-21 MED ORDER — CEFAZOLIN SODIUM-DEXTROSE 2-4 GM/100ML-% IV SOLN
2.0000 g | INTRAVENOUS | Status: AC
  Administered 2021-09-21: 2 g via INTRAVENOUS
  Filled 2021-09-21: qty 100

## 2021-09-21 MED ORDER — OXYCODONE HCL 5 MG PO TABS: 5.0000 mg | ORAL_TABLET | Freq: Four times a day (QID) | ORAL | 0 refills | Status: DC | PRN

## 2021-09-21 MED ORDER — FENTANYL CITRATE (PF) 100 MCG/2ML IJ SOLN
INTRAMUSCULAR | Status: AC
  Administered 2021-09-21: 50 ug via INTRAVENOUS
  Filled 2021-09-21: qty 2

## 2021-09-21 MED ORDER — MAGTRACE LYMPHATIC TRACER
INTRAMUSCULAR | Status: DC | PRN
  Administered 2021-09-21: 2 mL via INTRAMUSCULAR

## 2021-09-21 MED ORDER — SCOPOLAMINE 1 MG/3DAYS TD PT72: 1.0000 | MEDICATED_PATCH | Freq: Once | TRANSDERMAL | Status: DC

## 2021-09-21 MED ORDER — ROPIVACAINE HCL 5 MG/ML IJ SOLN
INTRAMUSCULAR | Status: DC | PRN
  Administered 2021-09-21: 30 mL via PERINEURAL

## 2021-09-21 MED ORDER — FENTANYL CITRATE (PF) 100 MCG/2ML IJ SOLN: 50.0000 ug | Freq: Once | INTRAMUSCULAR | Status: AC

## 2021-09-21 MED ORDER — PHENYLEPHRINE 40 MCG/ML (10ML) SYRINGE FOR IV PUSH (FOR BLOOD PRESSURE SUPPORT)
PREFILLED_SYRINGE | INTRAVENOUS | Status: DC | PRN
  Administered 2021-09-21: 200 ug via INTRAVENOUS

## 2021-09-21 MED ORDER — LIDOCAINE HCL 1 % IJ SOLN
INTRAMUSCULAR | Status: DC | PRN
  Administered 2021-09-21: 10 mL via INTRAMUSCULAR

## 2021-09-21 MED ORDER — ORAL CARE MOUTH RINSE: 15.0000 mL | Freq: Once | OROMUCOSAL | Status: AC

## 2021-09-21 MED ORDER — LIDOCAINE HCL (PF) 1 % IJ SOLN
INTRAMUSCULAR | Status: AC
  Filled 2021-09-21: qty 30

## 2021-09-21 SURGICAL SUPPLY — 52 items
ADH SKN CLS APL DERMABOND .7 (GAUZE/BANDAGES/DRESSINGS) ×1
APL PRP STRL LF DISP 70% ISPRP (MISCELLANEOUS) ×1
BAG COUNTER SPONGE SURGICOUNT (BAG) ×3 IMPLANT
BAG SPNG CNTER NS LX DISP (BAG) ×1
BINDER BREAST LRG (GAUZE/BANDAGES/DRESSINGS) ×1 IMPLANT
BINDER BREAST XLRG (GAUZE/BANDAGES/DRESSINGS) IMPLANT
BNDG COHESIVE 4X5 TAN STRL (GAUZE/BANDAGES/DRESSINGS) ×3 IMPLANT
CANISTER SUCT 3000ML PPV (MISCELLANEOUS) ×3 IMPLANT
CHLORAPREP W/TINT 26 (MISCELLANEOUS) ×3 IMPLANT
CLIP TI LARGE 6 (CLIP) ×3 IMPLANT
CLIP TI MEDIUM 24 (CLIP) ×3 IMPLANT
CNTNR URN SCR LID CUP LEK RST (MISCELLANEOUS) IMPLANT
CONT SPEC 4OZ STRL OR WHT (MISCELLANEOUS)
COVER PROBE W GEL 5X96 (DRAPES) ×3 IMPLANT
COVER SURGICAL LIGHT HANDLE (MISCELLANEOUS) ×3 IMPLANT
DERMABOND ADVANCED (GAUZE/BANDAGES/DRESSINGS) ×1
DERMABOND ADVANCED .7 DNX12 (GAUZE/BANDAGES/DRESSINGS) ×2 IMPLANT
DEVICE DUBIN SPECIMEN MAMMOGRA (MISCELLANEOUS) IMPLANT
DRAPE CHEST BREAST 15X10 FENES (DRAPES) ×3 IMPLANT
DRAPE SURG 17X23 STRL (DRAPES) IMPLANT
ELECT COATED BLADE 2.86 ST (ELECTRODE) ×3 IMPLANT
ELECT REM PT RETURN 9FT ADLT (ELECTROSURGICAL) ×2
ELECTRODE REM PT RTRN 9FT ADLT (ELECTROSURGICAL) ×2 IMPLANT
GAUZE SPONGE 4X4 12PLY STRL (GAUZE/BANDAGES/DRESSINGS) ×1 IMPLANT
GLOVE SURG ENC MOIS LTX SZ6 (GLOVE) ×3 IMPLANT
GLOVE SURG UNDER LTX SZ6.5 (GLOVE) ×3 IMPLANT
GOWN STRL REUS W/ TWL LRG LVL3 (GOWN DISPOSABLE) ×2 IMPLANT
GOWN STRL REUS W/TWL 2XL LVL3 (GOWN DISPOSABLE) ×3 IMPLANT
GOWN STRL REUS W/TWL LRG LVL3 (GOWN DISPOSABLE) ×2
KIT BASIN OR (CUSTOM PROCEDURE TRAY) ×3 IMPLANT
KIT MARKER MARGIN INK (KITS) ×3 IMPLANT
LIGHT WAVEGUIDE WIDE FLAT (MISCELLANEOUS) ×1 IMPLANT
NDL 18GX1X1/2 (RX/OR ONLY) (NEEDLE) IMPLANT
NDL FILTER BLUNT 18X1 1/2 (NEEDLE) IMPLANT
NDL HYPO 25GX1X1/2 BEV (NEEDLE) ×2 IMPLANT
NEEDLE 18GX1X1/2 (RX/OR ONLY) (NEEDLE) IMPLANT
NEEDLE FILTER BLUNT 18X 1/2SAF (NEEDLE) ×1
NEEDLE FILTER BLUNT 18X1 1/2 (NEEDLE) ×1 IMPLANT
NEEDLE HYPO 25GX1X1/2 BEV (NEEDLE) ×2 IMPLANT
NS IRRIG 1000ML POUR BTL (IV SOLUTION) ×3 IMPLANT
PACK GENERAL/GYN (CUSTOM PROCEDURE TRAY) ×3 IMPLANT
PACK UNIVERSAL I (CUSTOM PROCEDURE TRAY) ×3 IMPLANT
PAD ABD 8X10 STRL (GAUZE/BANDAGES/DRESSINGS) ×1 IMPLANT
STOCKINETTE IMPERVIOUS 9X36 MD (GAUZE/BANDAGES/DRESSINGS) ×3 IMPLANT
STRIP CLOSURE SKIN 1/2X4 (GAUZE/BANDAGES/DRESSINGS) ×1 IMPLANT
SUT MNCRL AB 4-0 PS2 18 (SUTURE) ×3 IMPLANT
SUT VIC AB 2-0 SH 27 (SUTURE) ×2
SUT VIC AB 2-0 SH 27XBRD (SUTURE) IMPLANT
SUT VIC AB 3-0 SH 8-18 (SUTURE) ×3 IMPLANT
SYR CONTROL 10ML LL (SYRINGE) ×3 IMPLANT
TOWEL GREEN STERILE (TOWEL DISPOSABLE) ×3 IMPLANT
TOWEL GREEN STERILE FF (TOWEL DISPOSABLE) ×3 IMPLANT

## 2021-09-21 NOTE — Op Note (Signed)
Left Breast Radioactive seed bracketed lumpectomy, left seed targeted deep axillary lymph node biopsy, and sentinel lymph node biopsy ? ?Indications: This patient presents with history of left breast cancer, upper inner quadrant, cT1-2N0-1 grade 2 invasive ductal carcinoma with DCIS, +/+/-, Ki 67 5% ? ?Pre-operative Diagnosis: left breast cancer ? ?Post-operative Diagnosis: same ? ?Surgeon: [name]  ? ?Assistant: Celene Squibb, RNFA ? ?Anesthesia: General endotracheal anesthesia ? ?ASA Class: 3 ? ?Procedure Details  ?The patient was seen in the Holding Room. The risks, benefits, complications, treatment options, and expected outcomes were discussed with the patient. The possibilities of bleeding, infection, the need for additional procedures, failure to diagnose a condition, and creating a complication requiring transfusion or operation were discussed with the patient. The patient concurred with the proposed plan, giving informed consent.  The site of surgery properly noted/marked. The patient was taken to Operating Room # 2, identified, and the procedure verified as left Breast Seed bracketed Lumpectomy, left seed targeted lymph node biopsy and left sentinel lymph node biopsy. A Time Out was held and the above information confirmed. ? ?The left arm, breast, and chest were prepped and draped in standard fashion. The lumpectomy was performed by creating a transverse medial incision near the previously placed radioactive seeds.  Dissection was carried down to around the points of maximum signal intensity. The cautery was used to perform the dissection.  Hemostasis was achieved with cautery. The edges of the cavity were marked with large clips, with one each medial, lateral, inferior and superior, and two clips posteriorly.   The specimen was inked with the margin marker paint kit.    Specimen radiography confirmed inclusion of the mammographic lesion, the clip, and the seeds.  The background signal in the breast  was zero.  The wound was irrigated and closed with 3-0 vicryl in layers and 4-0 monocryl subcuticular suture.   ? ?Using a Sentimag probe, left axillary sentinel nodes were identified transcutaneously.  An oblique incision was created below the axillary hairline.  Dissection was carried through the clavipectoral fascia.  Four deep level 2 axillary sentinel nodes were removed. Findings are below Sentinel node number 2 contained the seed.    The background count was 200.  The wound was irrigated.  Hemostasis was achieved with cautery.  The axillary incision was closed with a 3-0 vicryl deep dermal interrupted sutures and a 4-0 monocryl subcuticular closure. ?   ?Sterile dressings were applied. At the end of the operation, all sponge, instrument, and needle counts were correct. ? ?Findings: ?grossly clear surgical margins and no adenopathy, anterior margin is skin, posterior margin is pectoralis ?SLN 1 hot, counts around 890; SLN #2 hot, counts around 9000, contains seed; SLN #3 hot, counts 2800; SLN #4 hot, counts 1200; background around 200 ? ?Estimated Blood Loss:  min ?        ?Specimens: left breast tissue with seeds, sentinel node #1-4 ?        ?Complications:  None; patient tolerated the procedure well. ?        ?Disposition: PACU - hemodynamically stable. ?        ?Condition: stable ? ?

## 2021-09-21 NOTE — Transfer of Care (Signed)
Immediate Anesthesia Transfer of Care Note ? ?Patient: Kelli Vaughn ? ?Procedure(s) Performed: LEFT BREAST LUMPECTOMY WITH RADIOACTIVE SEED X2 AND SENTINEL LYMPH NODE BIOPSY (Left: Breast) ?RADIOACTIVE SEED GUIDED AXILLARY SENTINEL LYMPH NODE BIOPSY (Left: Breast) ? ?Patient Location: PACU ? ?Anesthesia Type:General ? ?Level of Consciousness: drowsy and patient cooperative ? ?Airway & Oxygen Therapy: Patient Spontanous Breathing ? ?Post-op Assessment: Report given to RN and Post -op Vital signs reviewed and stable ? ?Post vital signs: Reviewed and stable ? ?Last Vitals:  ?Vitals Value Taken Time  ?BP 164/69 09/21/21 1731  ?Temp 36.6 ?C 09/21/21 1730  ?Pulse 78 09/21/21 1736  ?Resp 19 09/21/21 1736  ?SpO2 94 % 09/21/21 1736  ?Vitals shown include unvalidated device data. ? ?Last Pain:  ?Vitals:  ? 09/21/21 1419  ?TempSrc:   ?PainSc: 0-No pain  ?   ? ?  ? ?Complications: No notable events documented. ?

## 2021-09-21 NOTE — Anesthesia Preprocedure Evaluation (Addendum)
Anesthesia Evaluation  ?Patient identified by MRN, date of birth, ID band ?Patient awake ? ? ? ?Reviewed: ?Allergy & Precautions, NPO status , Patient's Chart, lab work & pertinent test results ? ?History of Anesthesia Complications ?(+) PONV and history of anesthetic complications ? ?Airway ?Mallampati: II ? ?TM Distance: >3 FB ?Neck ROM: Full ? ? ? Dental ? ?(+) Dental Advisory Given, Missing ?  ?Pulmonary ?neg pulmonary ROS, former smoker,  ?  ?Pulmonary exam normal ?breath sounds clear to auscultation ? ? ? ? ? ? Cardiovascular ?hypertension, Pt. on medications ?Normal cardiovascular exam ?Rhythm:Regular Rate:Normal ? ? ?  ?Neuro/Psych ?negative neurological ROS ? negative psych ROS  ? GI/Hepatic ?negative GI ROS, Neg liver ROS,   ?Endo/Other  ?diabetes, Type 2, Insulin DependentObese BMI 39 ? Renal/GU ?negative Renal ROS  ?negative genitourinary ?  ?Musculoskeletal ?negative musculoskeletal ROS ?(+)  ? Abdominal ?  ?Peds ? Hematology ?negative hematology ROS ?(+)   ?Anesthesia Other Findings ?Left breast CA ? Reproductive/Obstetrics ? ?  ? ? ? ? ? ? ? ? ? ? ? ? ? ?  ?  ? ? ? ? ? ? ? ?Anesthesia Physical ?Anesthesia Plan ? ?ASA: 3 ? ?Anesthesia Plan: General and Regional  ? ?Post-op Pain Management: Regional block* and Tylenol PO (pre-op)*  ? ?Induction: Intravenous ? ?PONV Risk Score and Plan: 4 or greater and Ondansetron, Dexamethasone and Midazolam ? ?Airway Management Planned: LMA ? ?Additional Equipment:  ? ?Intra-op Plan:  ? ?Post-operative Plan: Extubation in OR ? ?Informed Consent: I have reviewed the patients History and Physical, chart, labs and discussed the procedure including the risks, benefits and alternatives for the proposed anesthesia with the patient or authorized representative who has indicated his/her understanding and acceptance.  ? ? ? ?Dental advisory given ? ?Plan Discussed with: CRNA ? ?Anesthesia Plan Comments:   ? ? ? ? ? ? ?Anesthesia Quick  Evaluation ? ?

## 2021-09-21 NOTE — Discharge Instructions (Addendum)
[facility] Office Phone Number [phone]  BREAST BIOPSY/ PARTIAL MASTECTOMY: POST OP INSTRUCTIONS  Always review your discharge instruction sheet given to you by the facility where your surgery was performed.  IF YOU HAVE DISABILITY OR FAMILY LEAVE FORMS, YOU MUST BRING THEM TO THE OFFICE FOR PROCESSING.  DO NOT GIVE THEM TO YOUR DOCTOR.  Take 2 tylenol (acetominophen) three times a day for 3 days.  If you still have pain, add ibuprofen with food in between if able to take this (if you have kidney issues or stomach issues, do not take ibuprofen).  If both of those are not enough, add the narcotic pain pill.  If you find you are needing a lot of this overnight after surgery, call the next morning for a refill.    Prescriptions will not be filled after 5pm or on week-ends. Take your usually prescribed medications unless otherwise directed You should eat very light the first 24 hours after surgery, such as soup, crackers, pudding, etc.  Resume your normal diet the day after surgery. Most patients will experience some swelling and bruising in the breast.  Ice packs and a good support bra will help.  Swelling and bruising can take several days to resolve.  It is common to experience some constipation if taking pain medication after surgery.  Increasing fluid intake and taking a stool softener will usually help or prevent this problem from occurring.  A mild laxative (Milk of Magnesia or Miralax) should be taken according to package directions if there are no bowel movements after 48 hours. Unless discharge instructions indicate otherwise, you may remove your bandages 48 hours after surgery, and you may shower at that time.  You may have steri-strips (small skin tapes) in place directly over the incision.  These strips should be left on the skin at least for for 7-10 days.    ACTIVITIES:  You may resume regular daily activities (gradually increasing) beginning the next day.  Wearing a  good support bra or sports bra (or the breast binder) minimizes pain and swelling.  You may have sexual intercourse when it is comfortable. No heavy lifting for 1-2 weeks (not over around 10 pounds).  You may drive when you no longer are taking prescription pain medication, you can comfortably wear a seatbelt, and you can safely maneuver your car and apply brakes. RETURN TO WORK:  __________3-14 days depending on job. _______________ You should see your doctor in the office for a follow-up appointment approximately two weeks after your surgery.  Your doctor's nurse will typically make your follow-up appointment when she calls you with your pathology report.  Expect your pathology report 3-4 business days after your surgery.  You may call to check if you do not hear from us after three days.   WHEN TO CALL YOUR DOCTOR: Fever over 101.0 Nausea and/or vomiting. Extreme swelling or bruising. Continued bleeding from incision. Increased pain, redness, or drainage from the incision.  The clinic staff is available to answer your questions during regular business hours.  Please don't hesitate to call and ask to speak to one of the nurses for clinical concerns.  If you have a medical emergency, go to the nearest emergency room or call 911.  A surgeon from [facility] is always on call at the hospital.  For further questions, please visit [email or web address]   

## 2021-09-21 NOTE — Anesthesia Procedure Notes (Signed)
Procedure Name: LMA Insertion ?Date/Time: 09/21/2021 3:20 PM ?Performed by: Georgia Duff, CRNA ?Pre-anesthesia Checklist: Patient identified, Emergency Drugs available, Suction available and Patient being monitored ?Patient Re-evaluated:Patient Re-evaluated prior to induction ?Oxygen Delivery Method: Circle System Utilized ?Preoxygenation: Pre-oxygenation with 100% oxygen ?Induction Type: IV induction ?Ventilation: Mask ventilation without difficulty ?LMA: LMA inserted ?LMA Size: 4.0 ?Number of attempts: 1 ?Airway Equipment and Method: Bite block ?Placement Confirmation: positive ETCO2 ?Tube secured with: Tape ?Dental Injury: Teeth and Oropharynx as per pre-operative assessment  ? ? ? ? ?

## 2021-09-21 NOTE — Interval H&P Note (Signed)
History and Physical Interval Note: ? ?09/21/2021 ?2:34 PM ? ?[facility] Kelli Vaughn  has presented today for surgery, with the diagnosis of LEFT BREAST CANCER.  The various methods of treatment have been discussed with the patient and family. After consideration of risks, benefits and other options for treatment, the patient has consented to  Procedure(s): ?LEFT BREAST LUMPECTOMY WITH RADIOACTIVE SEED X2 AND SENTINEL LYMPH NODE BIOPSY (Left) ?RADIOACTIVE SEED GUIDED AXILLARY SENTINEL LYMPH NODE BIOPSY (Left) as a surgical intervention.  The patient's history has been reviewed, patient examined, no change in status, stable for surgery.  I have reviewed the patient's chart and labs.  Questions were answered to the patient's satisfaction.   ? ? ?[name] ? ? ?

## 2021-09-21 NOTE — Anesthesia Procedure Notes (Signed)
Anesthesia Regional Block: Pectoralis block  ? ?Pre-Anesthetic Checklist: , timeout performed,  Correct Patient, Correct Site, Correct Laterality,  Correct Procedure, Correct Position, site marked,  Risks and benefits discussed,  Surgical consent,  Pre-op evaluation,  At surgeon's request and post-op pain management ? ?Laterality: Left ? ?Prep: Maximum Sterile Barrier Precautions used, chloraprep     ?  ?Needles:  ?Injection technique: Single-shot ? ?Needle Type: Echogenic Stimulator Needle   ? ? ?Needle Length: 9cm  ?Needle Gauge: 22  ? ? ? ?Additional Needles: ? ? ?Procedures:,,,, ultrasound used (permanent image in chart),,    ?Narrative:  ?Start time: 09/21/2021 12:55 PM ?End time: 09/21/2021 1:01 PM ?Injection made incrementally with aspirations every 5 mL. ? ?Performed by: Personally  ?Anesthesiologist: Kelli March, MD ? ?Additional Notes: ?Monitors applied. No increased pain on injection. No increased resistance to injection. Injection made in 5cc increments. Good needle visualization. Patient tolerated procedure well.  ? ? ? ? ?

## 2021-09-22 ENCOUNTER — Encounter (HOSPITAL_COMMUNITY): Payer: Self-pay | Admitting: General Surgery

## 2021-09-22 NOTE — Anesthesia Postprocedure Evaluation (Signed)
Anesthesia Post Note ? ?Patient: Kelli Vaughn ? ?Procedure(s) Performed: LEFT BREAST LUMPECTOMY WITH RADIOACTIVE SEED X2 AND SENTINEL LYMPH NODE BIOPSY (Left: Breast) ?RADIOACTIVE SEED GUIDED AXILLARY SENTINEL LYMPH NODE BIOPSY (Left: Breast) ? ?  ? ?Patient location during evaluation: PACU ?Anesthesia Type: Regional and General ?Level of consciousness: sedated and patient cooperative ?Pain management: pain level controlled ?Vital Signs Assessment: post-procedure vital signs reviewed and stable ?Respiratory status: spontaneous breathing ?Cardiovascular status: stable ?Anesthetic complications: no ? ? ?No notable events documented. ? ?Last Vitals:  ?Vitals:  ? 09/21/21 1800 09/21/21 1815  ?BP: (!) 157/66 (!) 171/63  ?Pulse: 78 70  ?Resp: 17 14  ?Temp:  36.6 ?C  ?SpO2: 92% 97%  ?  ?Last Pain:  ?Vitals:  ? 09/21/21 1804  ?TempSrc:   ?PainSc: Asleep  ? ? ?  ?  ?  ?  ?  ?  ? ?Nolon Nations ? ? ? ? ?

## 2021-09-27 ENCOUNTER — Telehealth: Payer: Self-pay | Admitting: Genetic Counselor

## 2021-09-27 ENCOUNTER — Ambulatory Visit: Payer: Self-pay | Admitting: Genetic Counselor

## 2021-09-27 DIAGNOSIS — Z1379 Encounter for other screening for genetic and chromosomal anomalies: Secondary | ICD-10-CM

## 2021-09-27 NOTE — Telephone Encounter (Signed)
I contacted Ms. Southern to discuss her genetic testing results. No pathogenic variants were identified in the 36 genes analyzed. 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 Detailed clinic note to follow. ? ?The test report has been scanned into EPIC and is located under the Molecular Pathology section of the Results Review tab.  A portion of the result report is included below for reference.  ? ?Lucille Passy, MS, [place] ?Genetic Counselor ?[email or web address] ?(P) [phone] ? ? ?

## 2021-09-27 NOTE — Progress Notes (Addendum)
 HPI:   [name] was previously seen in the [facility] due to a personal and family history of cancer and concerns regarding a hereditary predisposition to cancer. Please refer to our prior cancer genetics clinic note for more information regarding our discussion, assessment and recommendations, at the time. Ms. Willhoite recent genetic test results were disclosed to her, as were recommendations warranted by these results. These results and recommendations are discussed in more detail below.  CANCER HISTORY:  Oncology History Overview Note  Patient was called back for evaluation of LEFT breast calcifications, focal asymmetry, and possible enlarged LEFT axillary lymph node from screening mammogram at [facility] on 05/20/2021. 08/12/2021, Suspicious mass in the 9:30 o'clock location of the LEFT breast warranting tissue diagnosis. Two LEFT axillary lymph nodes with abnormal morphology. 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 Lymph node biopsy of left axilla negative, lymph node biopsy results were thought to be discordant.   Patient does recollect being asked to schedule biopsy back in 2021 for abnormal mammogram but she was worried or had this misinformation that biopsy can let the breast cancer spread to other parts hence she decided not to proceed with biopsy at that time. She is here with her 2 sons, speaks in [place], Spanish interpreter present at the time of my visit.  Son also help with the translation.   Malignant neoplasm of upper-inner quadrant of left breast in female, estrogen receptor positive (HCC)  08/31/2021 Initial Diagnosis   Malignant neoplasm of upper-inner quadrant of left breast in female, estrogen receptor positive (HCC)   09/01/2021 Cancer Staging   Staging form: Breast, AJCC 8th Edition - Clinical stage from 09/01/2021: Stage Unknown  (cT1b, cNX, cM0, G2, ER+, PR+, HER2-) - Signed by Rachel Moulds, MD on 09/01/2021 Stage prefix: Initial diagnosis Histologic grading system: 3 grade system     Genetic Testing   [facility] CancerNext Panel was Negative. 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 Report date is 09/20/2021.  The CancerNext gene panel offered by [employer] includes sequencing, rearrangement analysis, and RNA analysis for the following 36 genes:   APC, ATM, AXIN2, BARD1, BMPR1A, BRCA1, BRCA2, BRIP1, CDH1, CDK4, CDKN2A, CHEK2, DICER1, HOXB13, EPCAM, GREM1, MLH1, MSH2, MSH3, MSH6, MUTYH, NBN, NF1, NTHL1, PALB2, PMS2, POLD1, POLE, PTEN, RAD51C, RAD51D, RECQL, SMAD4, SMARCA4, STK11, and TP53.      FAMILY HISTORY:  We obtained a detailed, 4-generation family history.  Significant diagnoses are listed below:      Family History  Problem Relation Age of Onset   Heart disease Mother     Bone cancer Father     Breast cancer Sister          dx. 30s   Stomach cancer Sister          dx. 30s, unsure if second primary or metastasis   Breast cancer Niece        Ms. Defina's sister was diagnosed with breast cancer in he 30s, she was also diagnosed with stomach cancer but it is unknown if the stomach cancer was a second primary or metastasis, her sister died in her 30s. This sister's daughter has a history of breast cancer. Ms. Ramaswamy father was diagnosed with bone cancer in his knee at age 25, his leg was amputated, but the cancer still metastasized and he died one year  later at age 39.   Ms. Azer is unaware of previous family history of genetic testing for hereditary cancer risks. There is no reported Ashkenazi Jewish ancestry.  GENETIC TEST RESULTS:  The [facility] CancerNext Panel found no pathogenic mutations.   The CancerNext gene panel offered by [employer] includes sequencing, rearrangement analysis, and RNA analysis for the following 36 genes:   APC, ATM, AXIN2, BARD1,  BMPR1A, BRCA1, BRCA2, BRIP1, CDH1, CDK4, CDKN2A, CHEK2, DICER1, HOXB13, EPCAM, GREM1, MLH1, MSH2, MSH3, MSH6, MUTYH, NBN, NF1, NTHL1, PALB2, PMS2, POLD1, POLE, PTEN, RAD51C, RAD51D, RECQL, SMAD4, SMARCA4, STK11, and TP53.    The test report has been scanned into EPIC and is located under the Molecular Pathology section of the Results Review tab.  A portion of the result report is included below for reference. Genetic testing reported out on 09/20/2021.       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  At this time, it is unknown if this variant is associated with an increased risk for cancer or if it is benign, but most uncertain variants are reclassified to benign. It should not be used to make medical management decisions. With time, we suspect the laboratory will determine the significance of this variant, if any. If the laboratory reclassifies this variant, we will attempt to contact Ms. Strubel to discuss it further.   UPDATE: The VUS in CDH1 (p.T414N) has been reclassified to likely benign. Report date is 09/20/2023.  Even though a pathogenic variant was not identified, possible explanations for her personal history of cancer may include: There may be no hereditary risk for cancer in the family. The cancers in Ms. Delehanty and/or her family may be due to other genetic or environmental factors. There may be a gene mutation in one of these genes that current testing methods cannot detect, but that chance is small. There could be another gene that has not yet been discovered, or that we have not yet tested, that is responsible for the cancer diagnoses in the family.   Therefore, it is important to remain in touch with cancer genetics in the future so that we can continue to offer Ms. Azimi the most up to date genetic testing.   ADDITIONAL GENETIC TESTING:  We discussed with Ms. Pherigo that her genetic testing was fairly extensive.  If there  are genes identified to increase cancer risk that can be analyzed in the future, we would be happy to discuss and coordinate this testing at that time.   CANCER SCREENING RECOMMENDATIONS:  Ms. Mcginnis test result is considered negative (normal).  This means that we have not identified a hereditary cause for her personal and family history of cancer at this time.     An individual's cancer risk and medical management are not determined by genetic test results alone. Overall cancer risk assessment incorporates additional factors, including personal medical history, family history, and any available genetic information that may result in a personalized plan for cancer prevention and surveillance. Therefore, it is recommended she continue to follow the cancer management and screening guidelines provided by her oncology and primary healthcare provider.  RECOMMENDATIONS FOR FAMILY MEMBERS:   Since she did not inherit a mutation in a cancer predisposition gene included on this panel, her children could not have inherited a mutation from her in one of these genes. Individuals in this family might be at some increased risk of developing cancer, over the general population risk, due to  the family history of cancer. We recommend women in this family have a yearly mammogram beginning at age 17, or 41 years younger than the earliest onset of cancer, an annual clinical breast exam, and perform monthly breast self-exams.  We do not recommend familial testing for the CDH1 variant of uncertain significance (VUS).  FOLLOW-UP:  Cancer genetics is a rapidly advancing field and it is possible that new genetic tests will be appropriate for her and/or her family members in the future. We encouraged her to remain in contact with cancer genetics on an annual basis so we can update her personal and family histories and let her know of advances in cancer genetics that may benefit this family.   Our contact number was provided.  Ms. Ventresca questions were answered to her satisfaction, and she knows she is welcome to call us at anytime with additional questions or concerns.   Lalla Brothers, MS, [facility] Genetic Counselor [email or web address] (P) [phone]

## 2021-09-28 ENCOUNTER — Encounter: Payer: Self-pay | Admitting: *Deleted

## 2021-10-04 ENCOUNTER — Inpatient Hospital Stay: Payer: Self-pay | Attending: Hematology and Oncology | Admitting: Hematology and Oncology

## 2021-10-04 ENCOUNTER — Encounter: Payer: Self-pay | Admitting: Hematology and Oncology

## 2021-10-04 ENCOUNTER — Other Ambulatory Visit: Payer: Self-pay

## 2021-10-04 DIAGNOSIS — C50212 Malignant neoplasm of upper-inner quadrant of left female breast: Secondary | ICD-10-CM | POA: Insufficient documentation

## 2021-10-04 DIAGNOSIS — Z79899 Other long term (current) drug therapy: Secondary | ICD-10-CM | POA: Insufficient documentation

## 2021-10-04 DIAGNOSIS — Z170 Estrogen receptor positive status [ER+]: Secondary | ICD-10-CM | POA: Insufficient documentation

## 2021-10-04 NOTE — Assessment & Plan Note (Signed)
This is a very pleasant 66 year old female patient with past medical history significant for type 2 diabetes mellitus, hypertension who had an abnormal mammogram back in 2021 with 6 mm breast mass in the left breast at 930 o'clock as well as left abnormal axillary lymph nodes, did not undergo biopsy at that time as recommended who presented with abnormal mammogram again which led to further testing.  Left breast needle core biopsy at this time showed invasive ductal carcinoma, grade 2 along with DCIS, ER +80% strong intensity, PR +100% strong intensity, KI 5% and HER2 negative.  Lymph node biopsy was negative which was thought to be discordant.   ? ?She was presented in the breast [place] and recommendation was to proceed with surgery upfront followed by adjuvant recommendations based on final pathology.  She is here to review her pathology.  We only have a preliminary report so far.  This showed left invasive ductal carcinoma, 13 mm in greatest dimension, Nottingham grade 2, DCIS grade 2, all margins negative, 1 out of 5 axillary lymph nodes with micrometastasis, tumor measuring 1.3 mm, prognostics from lymph node are pending. ? ?At an in basket message to our nurse navigator to send Oncotype testing.  Have discussed the following details about Oncotype testing today with the patient's family. ? ?We have discussed about Oncotype Dx score which is a well validated prognostic scoring system which can predict outcome with endocrine therapy alone and whether chemotherapy reduces recurrence.  Typically in patients with ER positive cancers that are node negative if the RS score is high typically greater than or equal to 26, chemotherapy is recommended.  ?In women with intermediate recurrence score younger than 37, there can still be some role for chemotherapy in addition to endocrine therapy especially if the recurrence score is between 21-25. ?If chemotherapy is needed, this will precede radiation and then after radiation  she will continue on antiestrogen therapy. ?Discussed about genetic testing, continuing lifestyle interventions such as regular exercise, healthy diet with more focus on plant-based diet, limiting alcohol intake and avoiding any hormone replacement therapy. ? ?We also discussed about antiestrogen therapy.  We have discussed about mechanism of action of antiestrogen therapy and adverse effects in our previous visit.  Depending on Oncotype results, if she does not need any adjuvant chemotherapy, she will proceed with adjuvant radiation and return to clinic for antiestrogen therapy. ?At this time anticipate return to clinic in about 3 weeks to review Oncotype results. ?

## 2021-10-04 NOTE — Progress Notes (Signed)
[facility] ?CONSULT NOTE ? ?Patient Care Team: ?[name], MD as PCP - General (Family Medicine) ?[name], MD as Consulting Physician (General Surgery) ?Benay Pike, MD as Consulting Physician (Hematology and Oncology) ?Kyung Rudd, MD as Consulting Physician (Radiation Oncology) ?Rockwell Germany, RN as Oncology Nurse Navigator ?Mauro Kaufmann, RN as Oncology Nurse Navigator ? ?CHIEF COMPLAINTS/PURPOSE OF CONSULTATION:  ?Newly diagnosed breast cancer ? ?HISTORY OF PRESENTING ILLNESS:  ? ?Kelli Vaughn 66 y.o. female is here because of recent diagnosis of left IDC. ?I reviewed her records extensively and collaborated the history with the patient. ? ?SUMMARY OF ONCOLOGIC HISTORY: ?Oncology History Overview Note  ?Patient was called back for evaluation of LEFT breast calcifications, focal asymmetry, and possible enlarged LEFT axillary lymph node from screening mammogram at [facility] on 05/20/2021. ?08/12/2021, Suspicious mass in the 9:30 o'clock location of the LEFT breast warranting tissue diagnosis. Two LEFT axillary lymph nodes with abnormal morphology. 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 Lymph node biopsy of left axilla negative, lymph node biopsy results were thought to be discordant.   ?Patient does recollect being asked to schedule biopsy back in 2021 for abnormal mammogram but she was worried or had this misinformation that biopsy can let the breast cancer spread to other parts hence she decided not to proceed with biopsy at that time. ?She is here with her 2 sons, speaks in [place], [place] interpreter present at the time of my visit.  Son also help with the translation. ?  ?Malignant neoplasm of upper-inner quadrant of left breast in female, estrogen receptor positive ([place])  ?08/31/2021 Initial Diagnosis  ? Malignant neoplasm of upper-inner  quadrant of left breast in female, estrogen receptor positive ([place]) ?  ?09/01/2021 Cancer Staging  ? Staging form: Breast, AJCC 8th Edition ?- Clinical stage from 09/01/2021: Stage Unknown (cT1b, cNX, cM0, G2, ER+, PR+, HER2-) - Signed by Benay Pike, MD on 09/01/2021 ?Stage prefix: Initial diagnosis ?Histologic grading system: 3 grade system ? ?  ? Genetic Testing  ? [facility] CancerNext Panel was Negative. 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 Report date is 09/20/2021. ? ?The CancerNext gene panel offered by [employer] includes sequencing, rearrangement analysis, and RNA analysis for the following 36 genes:   APC, ATM, AXIN2, BARD1, BMPR1A, BRCA1, BRCA2, BRIP1, CDH1, CDK4, CDKN2A, CHEK2, DICER1, HOXB13, EPCAM, GREM1, MLH1, MSH2, MSH3, MSH6, MUTYH, NBN, NF1, NTHL1, PALB2, PMS2, POLD1, POLE, PTEN, RAD51C, RAD51D, RECQL, SMAD4, SMARCA4, STK11, and TP53.  ?  ?09/21/2021 Surgery  ? Patient had left breast lumpectomy with a sentinel lymph node excision on 09/21/2021.  Final pathology showed invasive carcinoma of no special type, 13 mm in greatest dimension, grade 2, DCIS grade 2, all margins negative for invasive carcinoma and DCIS, distance from invasive carcinoma to closest margin is 0.5 mm, posterior margin and distance from DCIS to closest margin, 1 mm posterior margin.  5 axillary lymph nodes removed.  1 axillary lymph node with micrometastasis, metastatic deposit of 0.3 mm in greatest dimension.  Prognostics not repeated on the primary tumor. ?Prognostics on the lymph node pending ?  ? ?Interval history ? ?She is healing well, she has noticed some swelling in the left axillary region but otherwise doing well.  She has known medical issues today.  Rest of the pertinent 10 point ROS reviewed and  negative. ? ? ?MEDICAL HISTORY:  ?Past Medical History:  ?Diagnosis Date  ? Complication of anesthesia   ? Diabetes mellitus without complication ([place])   ? TYPE II  ? Glaucoma    ? Hypertension   ? PONV (postoperative nausea and vomiting)   ? ? ?SURGICAL HISTORY: ?Past Surgical History:  ?Procedure Laterality Date  ? BREAST LUMPECTOMY WITH RADIOACTIVE SEED AND SENTINEL LYMPH NODE BIOPSY Left 09/21/2021  ? Procedure: LEFT BREAST LUMPECTOMY WITH RADIOACTIVE SEED X2 AND SENTINEL LYMPH NODE BIOPSY;  Surgeon: [name], MD;  Location: [facility];  Service: General;  Laterality: Left;  ? CESAREAN SECTION    ? X2  ? EYE SURGERY Bilateral 2019  ? cataract surgery  ? RADIOACTIVE SEED GUIDED AXILLARY SENTINEL LYMPH NODE Left 09/21/2021  ? Procedure: RADIOACTIVE SEED GUIDED AXILLARY SENTINEL LYMPH NODE BIOPSY;  Surgeon: [name], MD;  Location: [facility];  Service: General;  Laterality: Left;  ? ? ?SOCIAL HISTORY: ?Social History  ? ?Socioeconomic History  ? Marital status: Divorced  ?  Spouse name: Not on file  ? Number of children: 2  ? Years of education: Not on file  ? Highest education level: Some college, no degree  ?Occupational History  ? Not on file  ?Tobacco Use  ? Smoking status: Former  ?  Types: Cigarettes  ? Smokeless tobacco: Never  ?Vaping Use  ? Vaping Use: Never used  ?Substance and Sexual Activity  ? Alcohol use: No  ? Drug use: No  ? Sexual activity: Not Currently  ?Other Topics Concern  ? Not on file  ?Social History Narrative  ? Not on file  ? ?Social Determinants of Health  ? ?Financial Resource Strain: Not on file  ?Food Insecurity: No Food Insecurity  ? Worried About Charity fundraiser in the Last Year: Never true  ? Ran Out of Food in the Last Year: Never true  ?Transportation Needs: No Transportation Needs  ? Lack of Transportation (Medical): No  ? Lack of Transportation (Non-Medical): No  ?Physical Activity: Not on file  ?Stress: Not on file  ?Social Connections: Not on file  ?Intimate Partner Violence: Not on file  ? ? ?FAMILY HISTORY: ?Family History  ?Problem Relation Age of Onset  ? Heart disease Mother   ? Bone cancer Father   ? Breast cancer Sister   ?     dx. 64s  ?  Stomach cancer Sister   ?     dx. 12s, unsure if second primary or metastasis  ? Breast cancer Niece   ? ? ?ALLERGIES:  has No Known Allergies. ? ?MEDICATIONS:  ?Current Outpatient Medications  ?Medication Sig Dispense Refill  ? aspirin EC 81 MG tablet Take 81 mg by mouth at bedtime.    ? gabapentin (NEURONTIN) 300 MG capsule Take 300 mg by mouth daily at 12 noon.    ? glipiZIDE (GLUCOTROL) 10 MG tablet Take 5 mg by mouth in the morning and at bedtime.    ? hydrochlorothiazide (HYDRODIURIL) 25 MG tablet Take 25 mg by mouth in the morning.    ? ibuprofen (ADVIL,MOTRIN) 800 MG tablet Take 1,600 mg by mouth every 8 (eight) hours as needed for moderate pain.    ? insulin glargine (LANTUS) 100 UNIT/ML injection Inject 35 Units into the skin 2 (two) times daily.    ? lisinopril (ZESTRIL) 10 MG tablet Take 10 mg by mouth in the morning.    ? LUMIGAN 0.01 % SOLN Place 1 drop into both eyes at  bedtime.   3  ? oxyCODONE (OXY IR/ROXICODONE) 5 MG immediate release tablet Take 1 tablet (5 mg total) by mouth every 6 (six) hours as needed for severe pain. 10 tablet 0  ? SitaGLIPtin-MetFORMIN HCl (JANUMET XR) 50-1000 MG TB24 Take 1 tablet by mouth in the morning and at bedtime.    ? ?No current facility-administered medications for this visit.  ? ? ?REVIEW OF SYSTEMS:   ? ?Breast:  Denies any palpable lumps or discharge ? ? ?PHYSICAL EXAMINATION: ?ECOG PERFORMANCE STATUS: 0 - Asymptomatic ? ?Vitals:  ? 10/04/21 0846  ?BP: (!) 139/54  ?Pulse: 83  ?Resp: 18  ?Temp: (!) 97.5 ?F (36.4 ?C)  ?SpO2: 99%  ? ?Filed Weights  ? 10/04/21 0846  ?Weight: 193 lb 11.2 oz (87.9 kg)  ? ? ?Physical Exam ?Constitutional:   ?   Appearance: Normal appearance.  ?HENT:  ?   Head: Normocephalic and atraumatic.  ?Cardiovascular:  ?   Rate and Rhythm: Normal rate and regular rhythm.  ?Chest:  ? ? ?   Comments: Left breast lumpectomy scar appears well with Steri-Strips in place.  Left axillary scar has some postop seroma. ?Abdominal:  ?   General: Abdomen is  flat.  ?Musculoskeletal:  ?   Cervical back: No rigidity.  ?Lymphadenopathy:  ?   Cervical: No cervical adenopathy.  ?Skin: ?   General: Skin is warm and dry.  ?Neurological:  ?   General: No focal defi

## 2021-10-05 ENCOUNTER — Telehealth: Payer: Self-pay | Admitting: *Deleted

## 2021-10-05 ENCOUNTER — Telehealth: Payer: Self-pay | Admitting: Hematology and Oncology

## 2021-10-05 ENCOUNTER — Encounter: Payer: Self-pay | Admitting: *Deleted

## 2021-10-05 NOTE — Telephone Encounter (Signed)
Scheduled appointment per 03/27 los. Patient aware. ?

## 2021-10-05 NOTE — Telephone Encounter (Signed)
Received order for oncotype testing. Requisition faxed to pathology and [facility] °

## 2021-10-13 ENCOUNTER — Encounter (HOSPITAL_COMMUNITY): Payer: Self-pay

## 2021-10-14 ENCOUNTER — Ambulatory Visit: Payer: No Typology Code available for payment source | Admitting: Physical Therapy

## 2021-10-19 ENCOUNTER — Encounter: Payer: Self-pay | Admitting: *Deleted

## 2021-10-21 ENCOUNTER — Encounter: Payer: Self-pay | Admitting: *Deleted

## 2021-10-21 ENCOUNTER — Telehealth: Payer: Self-pay | Admitting: *Deleted

## 2021-10-21 ENCOUNTER — Encounter (HOSPITAL_COMMUNITY): Payer: Self-pay

## 2021-10-21 ENCOUNTER — Telehealth: Payer: Self-pay | Admitting: Radiation Oncology

## 2021-10-21 DIAGNOSIS — Z170 Estrogen receptor positive status [ER+]: Secondary | ICD-10-CM

## 2021-10-21 NOTE — Telephone Encounter (Signed)
LVM to schedule consult with [facility]/Dr. Lisbeth Renshaw ?

## 2021-10-21 NOTE — Telephone Encounter (Signed)
Received Oncotype score of 11. Physician team notified. Pt notified of results via spanish interpreter Almyra Free and next step for xrt relayed. ?

## 2021-10-25 ENCOUNTER — Encounter: Payer: Self-pay | Admitting: Genetic Counselor

## 2021-10-25 ENCOUNTER — Encounter: Payer: Self-pay | Admitting: *Deleted

## 2021-10-25 ENCOUNTER — Encounter: Payer: Self-pay | Admitting: Hematology and Oncology

## 2021-10-25 ENCOUNTER — Inpatient Hospital Stay: Payer: Self-pay | Attending: Hematology and Oncology | Admitting: Hematology and Oncology

## 2021-10-25 ENCOUNTER — Other Ambulatory Visit: Payer: Self-pay

## 2021-10-25 DIAGNOSIS — E119 Type 2 diabetes mellitus without complications: Secondary | ICD-10-CM | POA: Insufficient documentation

## 2021-10-25 DIAGNOSIS — Z170 Estrogen receptor positive status [ER+]: Secondary | ICD-10-CM

## 2021-10-25 DIAGNOSIS — I10 Essential (primary) hypertension: Secondary | ICD-10-CM | POA: Insufficient documentation

## 2021-10-25 DIAGNOSIS — Z87891 Personal history of nicotine dependence: Secondary | ICD-10-CM | POA: Insufficient documentation

## 2021-10-25 DIAGNOSIS — C50212 Malignant neoplasm of upper-inner quadrant of left female breast: Secondary | ICD-10-CM

## 2021-10-25 DIAGNOSIS — C773 Secondary and unspecified malignant neoplasm of axilla and upper limb lymph nodes: Secondary | ICD-10-CM | POA: Insufficient documentation

## 2021-10-25 DIAGNOSIS — Z79899 Other long term (current) drug therapy: Secondary | ICD-10-CM | POA: Insufficient documentation

## 2021-10-25 NOTE — Assessment & Plan Note (Signed)
This is a very pleasant 66 year old female patient with past medical history significant for type 2 diabetes mellitus, hypertension who had an abnormal mammogram back in 2021 with 6 mm breast mass in the left breast at 930 o'clock as well as left abnormal axillary lymph nodes, did not undergo biopsy at that time as recommended who presented with abnormal mammogram again which led to further testing.  Left breast needle core biopsy at this time showed invasive ductal carcinoma, grade 2 along with DCIS, ER +80% strong intensity, PR +100% strong intensity, KI 5% and HER2 negative.  Lymph node biopsy was negative which was thought to be discordant.   ? ?She was presented in the breast [place] and recommendation was to proceed with surgery upfront followed by adjuvant recommendations based on final pathology.  She is here to review her pathology.  We only have a preliminary report so far.  This showed left invasive ductal carcinoma, 13 mm in greatest dimension, Nottingham grade 2, DCIS grade 2, all margins negative, 1 out of 5 axillary lymph nodes with micrometastasis, tumor measuring 1.3 mm, prognostics from lymph node are pending.  I called pathology about not having the prognostic reports from the lymph node biopsy since this was mentioned in the original report.  They are looking at the status of this at this time. ? ?Oncotype DX showed a score of 11, there appears to be no benefit from chemotherapy.  Distant recurrence risk at 9 years of 13% with antiestrogen alone.  She was recommended to schedule a consult with radiation oncology. ?She should return to clinic after completion of adjuvant radiation to initiate antiestrogen therapy. ?Return to clinic in about 6 to 7 weeks from now. ?

## 2021-10-25 NOTE — Progress Notes (Signed)
[place] ?CONSULT NOTE ? ?Patient Care Team: ?[name], MD as PCP - General (Family Medicine) ?[name], MD as Consulting Physician (General Surgery) ?Benay Pike, MD as Consulting Physician (Hematology and Oncology) ?Kyung Rudd, MD as Consulting Physician (Radiation Oncology) ?Rockwell Germany, RN as Oncology Nurse Navigator ?Mauro Kaufmann, RN as Oncology Nurse Navigator ? ?CHIEF COMPLAINTS/PURPOSE OF CONSULTATION:  ? ?HISTORY OF PRESENTING ILLNESS:  ? ?Kelli Vaughn 66 y.o. female is here because of recent diagnosis of left IDC. ?I reviewed her records extensively and collaborated the history with the patient. ? ?SUMMARY OF ONCOLOGIC HISTORY: ?Oncology History Overview Note  ?Patient was called back for evaluation of LEFT breast calcifications, focal asymmetry, and possible enlarged LEFT axillary lymph node from screening mammogram at [facility] on 05/20/2021. ?08/12/2021, Suspicious mass in the 9:30 o'clock location of the LEFT breast warranting tissue diagnosis. Two LEFT axillary lymph nodes with abnormal morphology. 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 Lymph node biopsy of left axilla negative, lymph node biopsy results were thought to be discordant.   ?Patient does recollect being asked to schedule biopsy back in 2021 for abnormal mammogram but she was worried or had this misinformation that biopsy can let the breast cancer spread to other parts hence she decided not to proceed with biopsy at that time. ?She is here with her 2 sons, speaks in [place], [place] interpreter present at the time of my visit.  Son also help with the translation. ?  ?Malignant neoplasm of upper-inner quadrant of left breast in female, estrogen receptor positive ([place])  ?08/31/2021 Initial Diagnosis  ? Malignant neoplasm of upper-inner quadrant of left breast in  female, estrogen receptor positive ([place]) ?  ?09/01/2021 Cancer Staging  ? Staging form: Breast, AJCC 8th Edition ?- Clinical stage from 09/01/2021: Stage Unknown (cT1b, cNX, cM0, G2, ER+, PR+, HER2-) - Signed by Benay Pike, MD on 09/01/2021 ?Stage prefix: Initial diagnosis ?Histologic grading system: 3 grade system ? ?  ? Genetic Testing  ? [facility] CancerNext Panel was Negative. 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 Report date is 09/20/2021. ? ?The CancerNext gene panel offered by [employer] includes sequencing, rearrangement analysis, and RNA analysis for the following 36 genes:   APC, ATM, AXIN2, BARD1, BMPR1A, BRCA1, BRCA2, BRIP1, CDH1, CDK4, CDKN2A, CHEK2, DICER1, HOXB13, EPCAM, GREM1, MLH1, MSH2, MSH3, MSH6, MUTYH, NBN, NF1, NTHL1, PALB2, PMS2, POLD1, POLE, PTEN, RAD51C, RAD51D, RECQL, SMAD4, SMARCA4, STK11, and TP53.  ?  ?09/21/2021 Surgery  ? Patient had left breast lumpectomy with a sentinel lymph node excision on 09/21/2021.  Final pathology showed invasive carcinoma of no special type, 13 mm in greatest dimension, grade 2, DCIS grade 2, all margins negative for invasive carcinoma and DCIS, distance from invasive carcinoma to closest margin is 0.5 mm, posterior margin and distance from DCIS to closest margin, 1 mm posterior margin.  5 axillary lymph nodes removed.  1 axillary lymph node with micrometastasis, metastatic deposit of 0.3 mm in greatest dimension.  Prognostics not repeated on the primary tumor. ?Prognostics on the lymph node pending ?  ? ?Interval history ? ?Patient is doing very well today.  She is healing well from surgery.  She reports some bumps in the left breast where she had the surgical incision.  She also has some Steri-Strips in place.   ?She  is yet to make follow-up with radiation oncology for the consultation. ?Rest of the pertinent 10 point ROS reviewed and negative ? ?MEDICAL HISTORY:  ?Past Medical History:  ?Diagnosis Date  ?  Complication of anesthesia   ? Diabetes mellitus without complication ([place])   ? TYPE II  ? Glaucoma   ? Hypertension   ? PONV (postoperative nausea and vomiting)   ? ? ?SURGICAL HISTORY: ?Past Surgical History:  ?Procedure Laterality Date  ? BREAST LUMPECTOMY WITH RADIOACTIVE SEED AND SENTINEL LYMPH NODE BIOPSY Left 09/21/2021  ? Procedure: LEFT BREAST LUMPECTOMY WITH RADIOACTIVE SEED X2 AND SENTINEL LYMPH NODE BIOPSY;  Surgeon: [name], MD;  Location: [facility];  Service: General;  Laterality: Left;  ? CESAREAN SECTION    ? X2  ? EYE SURGERY Bilateral 2019  ? cataract surgery  ? RADIOACTIVE SEED GUIDED AXILLARY SENTINEL LYMPH NODE Left 09/21/2021  ? Procedure: RADIOACTIVE SEED GUIDED AXILLARY SENTINEL LYMPH NODE BIOPSY;  Surgeon: [name], MD;  Location: [facility];  Service: General;  Laterality: Left;  ? ? ?SOCIAL HISTORY: ?Social History  ? ?Socioeconomic History  ? Marital status: Divorced  ?  Spouse name: Not on file  ? Number of children: 2  ? Years of education: Not on file  ? Highest education level: Some college, no degree  ?Occupational History  ? Not on file  ?Tobacco Use  ? Smoking status: Former  ?  Types: Cigarettes  ? Smokeless tobacco: Never  ?Vaping Use  ? Vaping Use: Never used  ?Substance and Sexual Activity  ? Alcohol use: No  ? Drug use: No  ? Sexual activity: Not Currently  ?Other Topics Concern  ? Not on file  ?Social History Narrative  ? Not on file  ? ?Social Determinants of Health  ? ?Financial Resource Strain: Not on file  ?Food Insecurity: No Food Insecurity  ? Worried About Charity fundraiser in the Last Year: Never true  ? Ran Out of Food in the Last Year: Never true  ?Transportation Needs: No Transportation Needs  ? Lack of Transportation (Medical): No  ? Lack of Transportation (Non-Medical): No  ?Physical Activity: Not on file  ?Stress: Not on file  ?Social Connections: Not on file  ?Intimate Partner Violence: Not on file  ? ? ?FAMILY HISTORY: ?Family History  ?Problem Relation Age  of Onset  ? Heart disease Mother   ? Bone cancer Father   ? Breast cancer Sister   ?     dx. 4s  ? Stomach cancer Sister   ?     dx. 24s, unsure if second primary or metastasis  ? Breast cancer Niece   ? ? ?ALLERGIES:  has No Known Allergies. ? ?MEDICATIONS:  ?Current Outpatient Medications  ?Medication Sig Dispense Refill  ? aspirin EC 81 MG tablet Take 81 mg by mouth at bedtime.    ? gabapentin (NEURONTIN) 300 MG capsule Take 300 mg by mouth daily at 12 noon.    ? glipiZIDE (GLUCOTROL) 10 MG tablet Take 5 mg by mouth in the morning and at bedtime.    ? hydrochlorothiazide (HYDRODIURIL) 25 MG tablet Take 25 mg by mouth in the morning.    ? ibuprofen (ADVIL,MOTRIN) 800 MG tablet Take 1,600 mg by mouth every 8 (eight) hours as needed for moderate pain.    ? insulin glargine (LANTUS) 100 UNIT/ML injection Inject 35 Units into the skin 2 (two) times daily.    ? lisinopril (ZESTRIL) 10 MG tablet Take 10 mg by  mouth in the morning.    ? LUMIGAN 0.01 % SOLN Place 1 drop into both eyes at bedtime.   3  ? oxyCODONE (OXY IR/ROXICODONE) 5 MG immediate release tablet Take 1 tablet (5 mg total) by mouth every 6 (six) hours as needed for severe pain. 10 tablet 0  ? SitaGLIPtin-MetFORMIN HCl (JANUMET XR) 50-1000 MG TB24 Take 1 tablet by mouth in the morning and at bedtime.    ? ?No current facility-administered medications for this visit.  ? ? ?REVIEW OF SYSTEMS:   ? ?Breast:  Denies any palpable lumps or discharge ? ? ?PHYSICAL EXAMINATION: ?ECOG PERFORMANCE STATUS: 0 - Asymptomatic ? ?Vitals:  ? 10/25/21 0854  ?BP: (!) 178/69  ?Pulse: 84  ?Resp: 18  ?Temp: 97.7 ?F (36.5 ?C)  ?SpO2: 100%  ? ?Filed Weights  ? 10/25/21 0854  ?Weight: 196 lb 8 oz (89.1 kg)  ? ? ?Physical Exam ?Constitutional:   ?   Appearance: Normal appearance.  ?Chest:  ? ? ?   Comments: Left breast lumpectomy scar appears well with Steri-Strips in place.  Left axillary scar has some postop seroma. ?Neurological:  ?   Mental Status: She is alert.   ? ? ? ?LABORATORY DATA:  ?I have reviewed the data as listed ?Lab Results  ?Component Value Date  ? WBC 11.8 (H) 09/15/2021  ? HGB 13.6 09/15/2021  ? HCT 41.6 09/15/2021  ? MCV 91.4 09/15/2021  ? PLT 324 09/15/2021  ? ?Lab

## 2021-10-26 NOTE — Progress Notes (Signed)
?Radiation Oncology         [phone] ?________________________________ ? ?Name: Kelli Vaughn        MRN: [id number]  ?Date of Service: 10/28/2021 DOB: June 20, 1956 ? ?QJ:JHERDE, Lennette Bihari, MD  Benay Pike, MD    ? ?REFERRING PHYSICIAN: Benay Pike, MD ? ? ?DIAGNOSIS: The encounter diagnosis was Malignant neoplasm of upper-inner quadrant of left breast in female, estrogen receptor positive ([place]). ? ? ?HISTORY OF PRESENT ILLNESS: Kelli Vaughn is a 66 y.o. female originally seen in the multidisciplinary breast clinic for a new diagnosis of left breast cancer. The patient was noted to have bilateral breast pain.  She had gone for screening mammogram at an outside facility that showed  left assymmtry in 2021. She did not have any further work up at that time but returned recently and screening showed again left breast assymmetry calcifications, and a mass, as well as an abnormal appearing axillary node. By ultrasound the mass and calcifications measured up to 3.7 cm, and 2 abnormal nodes were noted. 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 Her cancer was ER/PR positive, HER2 negative and Ki 67 was 5%. One of her nodes was sampled and negative but felt to be discordant.  ? ?Since her last visit, the patient underwent left lumpectomy with sentinel lymph node biopsy on 09/21/2021 with Dr. Barry Dienes.  Final pathology showed a grade 2 invasive ductal carcinoma measuring 13 mm in greatest dimension with associated intermediate grade DCIS with necrosis and microcalcifications.  All of her margins were negative for invasive carcinoma, and her closest margin for DCIS was 1 mm to the posterior margin.  One of her 5 sampled lymph nodes contained micrometastatic deposit that was 3 mm in greatest dimension.  She had Oncotype testing performed which showed a value of 11 so no systemic chemotherapy has been recommended.  She is seen to discuss adjuvant  radiotherapy. ? ? ?PREVIOUS RADIATION THERAPY: No ? ? ?PAST MEDICAL HISTORY:  ?Past Medical History:  ?Diagnosis Date  ? Complication of anesthesia   ? Diabetes mellitus without complication ([place])   ? TYPE II  ? Glaucoma   ? Hypertension   ? PONV (postoperative nausea and vomiting)   ?   ? ? ?PAST SURGICAL HISTORY: ?Past Surgical History:  ?Procedure Laterality Date  ? BREAST LUMPECTOMY WITH RADIOACTIVE SEED AND SENTINEL LYMPH NODE BIOPSY Left 09/21/2021  ? Procedure: LEFT BREAST LUMPECTOMY WITH RADIOACTIVE SEED X2 AND SENTINEL LYMPH NODE BIOPSY;  Surgeon: [name], MD;  Location: [facility];  Service: General;  Laterality: Left;  ? CESAREAN SECTION    ? X2  ? EYE SURGERY Bilateral 2019  ? cataract surgery  ? RADIOACTIVE SEED GUIDED AXILLARY SENTINEL LYMPH NODE Left 09/21/2021  ? Procedure: RADIOACTIVE SEED GUIDED AXILLARY SENTINEL LYMPH NODE BIOPSY;  Surgeon: [name], MD;  Location: [facility];  Service: General;  Laterality: Left;  ? ? ? ?FAMILY HISTORY:  ?Family History  ?Problem Relation Age of Onset  ? Heart disease Mother   ? Bone cancer Father   ? Breast cancer Sister   ?     dx. 16s  ? Stomach cancer Sister   ?     dx. 49s, unsure if second primary or metastasis  ? Breast cancer Niece   ? ? ? ?SOCIAL HISTORY:  reports that she has quit smoking. Her smoking use included cigarettes. She has never used smokeless tobacco. She reports that she does not  drink alcohol and does not use drugs.  The patient is widowed and lives in [place]. She is accompanied by her two sons.  ? ? ?ALLERGIES: Patient has no known allergies. ? ? ?MEDICATIONS:  ?Current Outpatient Medications  ?Medication Sig Dispense Refill  ? aspirin EC 81 MG tablet Take 81 mg by mouth at bedtime.    ? gabapentin (NEURONTIN) 300 MG capsule Take 300 mg by mouth daily at 12 noon.    ? glipiZIDE (GLUCOTROL) 10 MG tablet Take 5 mg by mouth in the morning and at bedtime.    ? hydrochlorothiazide (HYDRODIURIL) 25 MG tablet Take 25 mg by mouth in the  morning.    ? ibuprofen (ADVIL,MOTRIN) 800 MG tablet Take 1,600 mg by mouth every 8 (eight) hours as needed for moderate pain.    ? insulin glargine (LANTUS) 100 UNIT/ML injection Inject 35 Units into the skin 2 (two) times daily.    ? lisinopril (ZESTRIL) 10 MG tablet Take 10 mg by mouth in the morning.    ? LUMIGAN 0.01 % SOLN Place 1 drop into both eyes at bedtime.   3  ? oxyCODONE (OXY IR/ROXICODONE) 5 MG immediate release tablet Take 1 tablet (5 mg total) by mouth every 6 (six) hours as needed for severe pain. 10 tablet 0  ? SitaGLIPtin-MetFORMIN HCl (JANUMET XR) 50-1000 MG TB24 Take 1 tablet by mouth in the morning and at bedtime.    ? ?No current facility-administered medications for this visit.  ? ? ? ?REVIEW OF SYSTEMS: On review of systems, the patient reports that she is doing well.  A complete review of systems was reviewed with the patient and pertinent positives findings are noted in the history of present illness. ? ?  ? ?PHYSICAL EXAM:  ?Wt Readings from Last 3 Encounters:  ?10/25/21 196 lb 8 oz (89.1 kg)  ?10/04/21 193 lb 11.2 oz (87.9 kg)  ?09/21/21 196 lb (88.9 kg)  ? ?Temp Readings from Last 3 Encounters:  ?10/25/21 97.7 ?F (36.5 ?C) (Temporal)  ?10/04/21 (!) 97.5 ?F (36.4 ?C) (Temporal)  ?09/21/21 97.9 ?F (36.6 ?C)  ? ?BP Readings from Last 3 Encounters:  ?10/25/21 (!) 178/69  ?10/04/21 (!) 139/54  ?09/21/21 (!) 171/63  ? ?Pulse Readings from Last 3 Encounters:  ?10/25/21 84  ?10/04/21 83  ?09/21/21 70  ? ? ?In general this is a well appearing Hispanic female in no acute distress. She's alert and oriented x4 and appropriate throughout the examination. Cardiopulmonary assessment is negative for acute distress and she exhibits normal effort.  Her left breast is intact with well-healed lumpectomy and axillary incision sites without erythema separation or drainage. ? ? ? ?ECOG = 1 ? ?0 - Asymptomatic (Fully active, able to carry on all predisease activities without restriction) ? ?1 - Symptomatic  but completely ambulatory (Restricted in physically strenuous activity but ambulatory and able to carry out work of a light or sedentary nature. For example, light housework, office work) ? ?2 - Symptomatic, <50% in bed during the day (Ambulatory and capable of all self care but unable to carry out any work activities. Up and about more than 50% of waking hours) ? ?3 - Symptomatic, >50% in bed, but not bedbound (Capable of only limited self-care, confined to bed or chair 50% or more of waking hours) ? ?4 - Bedbound (Completely disabled. Cannot carry on any self-care. Totally confined to bed or chair) ? ?5 - Death ? ? Oken MM, Creech RH, Tormey DC, et al. [phone]). "Toxicity  and response criteria of the [facility]". [place] Oncol. 5 (6): 649-55 ? ? ? ?LABORATORY DATA:  ?Lab Results  ?Component Value Date  ? WBC 11.8 (H) 09/15/2021  ? HGB 13.6 09/15/2021  ? HCT 41.6 09/15/2021  ? MCV 91.4 09/15/2021  ? PLT 324 09/15/2021  ? ?Lab Results  ?Component Value Date  ? NA 136 09/15/2021  ? K 4.4 09/15/2021  ? CL 100 09/15/2021  ? CO2 28 09/15/2021  ? ?Lab Results  ?Component Value Date  ? ALT 25 09/01/2021  ? AST 15 09/01/2021  ? ALKPHOS 89 09/01/2021  ? BILITOT 0.3 09/01/2021  ? ?  ? ?RADIOGRAPHY: No results found. ?   ? ?IMPRESSION/PLAN: ?1. 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 Her course and her pathology findings were reviewed.  Her lymph node was positive though described as micrometastasis.  Today she and I discussed the  pathology findings and  the nature of node positive breast disease. She has done well since surgery and does not need systemic chemotherapy based on her Oncotype DX score.  I would recommend external radiotherapy to the breast  to reduce risks of local recurrence followed by antiestrogen therapy. We discussed the risks, benefits, short, and long term effects of radiotherapy, as well as the curative  intent, and the patient is interested in proceeding.  We reviewed the delivery and logistics of radiotherapy and 4 weeks of radiotherapy to the left breast with high tangent fields were recommended. Written consent

## 2021-10-26 NOTE — Therapy (Signed)
?OUTPATIENT PHYSICAL THERAPY BREAST CANCER POST OP FOLLOW UP ? ? ?Patient Name: Kelli Vaughn ?MRN: [id number] ?DOB:1956-02-08, 66 y.o., female ?Today's Date: 10/27/2021 ? ? PT End of Session - 10/27/21 0851   ? ? Visit Number 2   ? Number of Visits 10   ? Date for PT Re-Evaluation 11/24/21   ? PT Start Time [phone]   ? PT Stop Time 0941   ? PT Time Calculation (min) 48 min   ? Activity Tolerance Patient tolerated treatment well   ? Behavior During Therapy [facility] for tasks assessed/performed   ? ?  ?  ? ?  ? ? ?Past Medical History:  ?Diagnosis Date  ? Complication of anesthesia   ? Diabetes mellitus without complication ([place])   ? TYPE II  ? Glaucoma   ? Hypertension   ? PONV (postoperative nausea and vomiting)   ? ?Past Surgical History:  ?Procedure Laterality Date  ? BREAST LUMPECTOMY WITH RADIOACTIVE SEED AND SENTINEL LYMPH NODE BIOPSY Left 09/21/2021  ? Procedure: LEFT BREAST LUMPECTOMY WITH RADIOACTIVE SEED X2 AND SENTINEL LYMPH NODE BIOPSY;  Surgeon: [name], MD;  Location: [facility];  Service: General;  Laterality: Left;  ? CESAREAN SECTION    ? X2  ? EYE SURGERY Bilateral 2019  ? cataract surgery  ? RADIOACTIVE SEED GUIDED AXILLARY SENTINEL LYMPH NODE Left 09/21/2021  ? Procedure: RADIOACTIVE SEED GUIDED AXILLARY SENTINEL LYMPH NODE BIOPSY;  Surgeon: [name], MD;  Location: [facility];  Service: General;  Laterality: Left;  ? ?Patient Active Problem List  ? Diagnosis Date Noted  ? Genetic testing 09/17/2021  ? Family history of breast cancer 09/01/2021  ? Malignant neoplasm of upper-inner quadrant of left breast in female, estrogen receptor positive ([place]) 08/31/2021  ? ? ?PCP: [name], MD ? ?REFERRING PROVIDER: [name], MD ? ?REFERRING DIAG: Left Breast Cancer ? ?THERAPY DIAG:  ?Malignant neoplasm of upper-inner quadrant of left breast in female, estrogen receptor positive ([place]) ? ?Abnormal posture ? ?Stiffness of left shoulder, not elsewhere classified ? ?Localized edema ? ?ONSET DATE:  05/20/2021 ? ?SUBJECTIVE:                                                                                                                                                                                          ? ?SUBJECTIVE STATEMENT: ?Pt reports she is sleeping OK. Besides difficulty with the exercises she was given because it hurts to reach, no other problems. (Pts sons interpreted until [place] (interpreter arrived) ? ?PERTINENT HISTORY:  ?Patient was diagnosed on 05/20/2021 with left grade II invasive ductal carcinoma breast cancer. It measures 3.7 cm and is  located in the upper inner quadrant. It is ER/PR positive and HER 2 negative with a Ki67 of 5%. 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 Surgery for left lumpectomy with SLNB was performed on 09/21/2021 with 1/5 LN's. Pt had a post op seroma.   ? ?PATIENT GOALS:  Reassess how my recovery is going related to arm function, pain, and swelling. ? ?PAIN:  ?Are you having pain? PAIN:  ?Are you having pain? Yes ?NPRS scale: 7-8/10 ?Pain location: left axilla ?Pain orientation: Left  ?PAIN TYPE: burning, tight ?Pain description: intermittent  ?Aggravating factors: reaching ?Relieving factors: rest ? ? ?PRECAUTIONS: Recent Surgery, left UE Lymphedema risk, Other: DM,hypertension ? ?ACTIVITY LEVEL / LEISURE: She hasn't done anything yet but stretches. ? ? ?OBJECTIVE:  ? ?PATIENT SURVEYS:  ?QUICK DASH: ?59% ? ?OBSERVATIONS: ? Steri strips still present over medial breast incision.  Firmness/swelling noted around incision. Axillary incision healed with mild firmness. Small cord noted in left axillary region that is very tender/hypersensitive for pt. Bruising/Mag trace? Noted lateral to nipple ? ?POSTURE:  ?Forward head, rounded shoulders ? ?LYMPHEDEMA ASSESSMENT:  ? ?A/PROM RIGHT  09/01/2021 ?   ?Shoulder extension 47  ?Shoulder flexion 158  ?Shoulder abduction 154  ?Shoulder internal rotation 49  ?Shoulder external rotation 76  ?                         (Blank rows = not tested) ?  ?A/PROM LEFT  09/01/2021 10/27/2021  ?Shoulder extension 46 55  ?Shoulder flexion 148 155  ?Shoulder abduction 147 140  ?Shoulder internal rotation 63 61  ?Shoulder external rotation 77 90  ?                        (Blank rows = not tested) ?  ?  ?CERVICAL AROM: ?All within normal limits ?  ?UPPER EXTREMITY STRENGTH: WFL ?  ?  ?LYMPHEDEMA ASSESSMENTS:  ?  ?[place] RIGHT  09/01/2021  ?10 cm proximal to olecranon process 32.2  ?Olecranon process 26.2  ?10 cm proximal to ulnar styloid process 24.6  ?Just proximal to ulnar styloid process 17.6  ?Across hand at thumb web space 19.4  ?At base of 2nd digit 6.5  ?(Blank rows = not tested) ?  ?[place] LEFT  09/01/2021   ?10 cm proximal to olecranon process 34.5 34.8  ?Olecranon process 27.4 27.4  ?10 cm proximal to ulnar styloid process 23.5 23.2  ?Just proximal to ulnar styloid process 17.5 17.5  ?Across hand at thumb web space 19.1 19.1  ?At base of 2nd digit 6.3 6.1  ?(Blank rows = not tested) ?  ?  ? ?  ?Surgery type/Date: left lumpectomy with SLNB, 09/21/2021 ?Number of lymph nodes removed: 5, 1 positive ?Current/past treatment (chemo, radiation, hormone therapy): pending radiation, hormone therapy ?Other symptoms:  ?Heaviness/tightness No ?Pain Yes ?Pitting edema No ?Infections No ?Decreased scar mobility Yes ?Stemmer sign No ? ? ?PATIENT EDUCATION:  ?Education details: reviewed home program 4 post op exercises, supine flex and stargazer ?Person educated: Patient ?Education method: Explanation, Demonstration, and Handouts ?Education comprehension: returned demonstration ? ? ?HOME EXERCISE PROGRAM: ? Reviewed previously given post op HEP. ?But had pt do flexion and star gazer in supine ? ?ASSESSMENT: ? 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 She is pending radiation but does not know when it will start.She is progressing fairly well with ROM but  is bothered by a small cord in the  axillary region and is very hypersensitive.  She has several small areas of firmness in the left breast and a foam pad was made to place in her compression bra over medial area of firmness.  We reviewed her HEP and she practiced supine flexion and stargazer, and therapist performed some gentle MFR to cording. ? ?Pt will benefit from skilled therapeutic intervention to improve on the following deficits: Decreased knowledge of precautions, impaired UE functional use, pain, decreased ROM, postural dysfunction, swelling  ? ?PT treatment/interventions: ADL/Self care home management, Therapeutic exercises, Therapeutic activity, Patient/Family education, Joint mobilization, Orthotic/Fit training, Manual lymph drainage, scar mobilization, and Manual therapy ? ? ? ? ?GOALS: ?Goals reviewed with patient? Yes ? ?LONG TERM GOALS:  (STG=LTG) ? ?GOALS Name Target Date Goal status  ?1 Pt will demonstrate she has regained full shoulder ROM and function post operatively compared to baselines.  ?Baseline: 11/24/2021 INITIAL  ?2 Pt will report pain/burning improved by 50% or more 11/24/2021 INITIAL  ?3 Quick dash will improve to no greater than 25% 11/24/2021 INITIAL  ?4 Pt will be educated in scar massage, and precautions for lymphedema 11/24/2021 INITIAL  ? ? ? ?PLAN: ?PT FREQUENCY/DURATION: 2x/week x 4 weeks prn ? ?PLAN FOR NEXT SESSION: scar massage to SLNB incision ,check for seroma, MFR to axillary region (cord near skin tag), PROM,MLD prn progress HEP ? ? ?[facility] ? [address], [address] ? [place] [address] ? [phone] ? ?After Breast Cancer Class ?It is recommended you attend the ABC class to be educated on lymphedema risk reduction. This class is free of charge and lasts for 1 hour. It is a 1-time class. You will need to download the [employer] app either on your phone or computer. We will send you a link the night before or the morning of the class. You should be able to click on that link to join  the class. This is not a confidential class. You don't have to turn your camera on, but other participants may be able to see your email address. ? ?Scar massage ?You can begin gentle scar massage to you in

## 2021-10-27 ENCOUNTER — Ambulatory Visit: Payer: No Typology Code available for payment source | Attending: General Surgery

## 2021-10-27 ENCOUNTER — Telehealth: Payer: Self-pay

## 2021-10-27 ENCOUNTER — Encounter: Payer: Self-pay | Admitting: Hematology and Oncology

## 2021-10-27 DIAGNOSIS — Z170 Estrogen receptor positive status [ER+]: Secondary | ICD-10-CM | POA: Insufficient documentation

## 2021-10-27 DIAGNOSIS — R600 Localized edema: Secondary | ICD-10-CM | POA: Insufficient documentation

## 2021-10-27 DIAGNOSIS — R293 Abnormal posture: Secondary | ICD-10-CM | POA: Insufficient documentation

## 2021-10-27 DIAGNOSIS — M25612 Stiffness of left shoulder, not elsewhere classified: Secondary | ICD-10-CM | POA: Insufficient documentation

## 2021-10-27 DIAGNOSIS — C50212 Malignant neoplasm of upper-inner quadrant of left female breast: Secondary | ICD-10-CM | POA: Insufficient documentation

## 2021-10-27 NOTE — Progress Notes (Signed)
Patient's son called on behalf of patient regarding balance and Financial Assistance status. ? ?Advised this is handled through customer service but I would review to see what I could find. ? ?Advised per notes patient was approved for 100% FA discount 09/01/21 - 03/01/22 and letter should have been mailed. Advised to contact the number on bill to customer service if she has any physical bills to discuss. He verbalized understanding. He has my number for any additional financial questions or concerns. ? ?Noticed a balance in self-pay from recent date of service. Emailed to [employer] in Therapist, art and [place] hospital advocate to have adjusted to approved discount versus 60% uninsured discount which was showing.Also requested copy of approval letter be sent to me so that I may provide to patient/son. ?

## 2021-10-27 NOTE — Telephone Encounter (Signed)
In-person consult reminder w/ spanish interpreter [phone]. I verified patient identity and reminded her of her 9:00am-10/28/21 in-person appointment w/ Shona Simpson PA-C. I advised patient to arrive 66mn early for check-in. I left my extension [phone] case patient needs anything. Patient verbalized understanding of information. ?

## 2021-10-27 NOTE — Progress Notes (Signed)
Staff message sent to Radiation advocates to connect with patient regarding [place] on 10/28/21. Patient will not be taking chemo only had surgery and then Radiation. Supporting documents submitted with [facility] Financial Assistance applications. Spoke with patient's son to advise. ?

## 2021-10-28 ENCOUNTER — Ambulatory Visit
Admission: RE | Admit: 2021-10-28 | Discharge: 2021-10-28 | Disposition: A | Discharge status: Home / Self Care | Payer: No Typology Code available for payment source | Source: Ambulatory Visit | Attending: Radiation Oncology | Admitting: Radiation Oncology

## 2021-10-28 ENCOUNTER — Encounter: Payer: Self-pay | Admitting: Radiation Oncology

## 2021-10-28 ENCOUNTER — Other Ambulatory Visit: Payer: Self-pay

## 2021-10-28 VITALS — BP 161/62 | HR 77 | Temp 97.5°F | Resp 20 | Ht 59.0 in | Wt 195.2 lb

## 2021-10-28 DIAGNOSIS — Z7982 Long term (current) use of aspirin: Secondary | ICD-10-CM | POA: Insufficient documentation

## 2021-10-28 DIAGNOSIS — Z87891 Personal history of nicotine dependence: Secondary | ICD-10-CM | POA: Insufficient documentation

## 2021-10-28 DIAGNOSIS — C50212 Malignant neoplasm of upper-inner quadrant of left female breast: Secondary | ICD-10-CM | POA: Insufficient documentation

## 2021-10-28 DIAGNOSIS — Z794 Long term (current) use of insulin: Secondary | ICD-10-CM | POA: Insufficient documentation

## 2021-10-28 DIAGNOSIS — I10 Essential (primary) hypertension: Secondary | ICD-10-CM | POA: Insufficient documentation

## 2021-10-28 DIAGNOSIS — Z170 Estrogen receptor positive status [ER+]: Secondary | ICD-10-CM | POA: Insufficient documentation

## 2021-10-28 DIAGNOSIS — Z803 Family history of malignant neoplasm of breast: Secondary | ICD-10-CM | POA: Insufficient documentation

## 2021-10-28 DIAGNOSIS — Z800 Family history of malignant neoplasm of digestive organs: Secondary | ICD-10-CM | POA: Insufficient documentation

## 2021-10-28 DIAGNOSIS — E119 Type 2 diabetes mellitus without complications: Secondary | ICD-10-CM | POA: Insufficient documentation

## 2021-10-28 DIAGNOSIS — Z79899 Other long term (current) drug therapy: Secondary | ICD-10-CM | POA: Insufficient documentation

## 2021-10-28 DIAGNOSIS — Z7984 Long term (current) use of oral hypoglycemic drugs: Secondary | ICD-10-CM | POA: Insufficient documentation

## 2021-10-28 LAB — SURGICAL PATHOLOGY

## 2021-10-28 NOTE — Progress Notes (Addendum)
Consult appointment. I verified patient identity, and began nursing interview w/ sons Anuar Macario Carls, and Lorenda Peck in attendance. Patient doing well. No issues reported at this time. ? ?Meaningful use complete. ?Postmenopausal- NO chances of pregnancy. ? ?BP (!) 161/62 (BP Location: Right Arm, Patient Position: Sitting, Cuff Size: Normal)   Pulse 77   Temp (!) 97.5 ?F (36.4 ?C) (Temporal)   Resp 20   Ht '4\' 11"'$  (1.499 m)   Wt 195 lb 3.2 oz (88.5 kg)   SpO2 99%   BMI 39.43 kg/m?  ? ?

## 2021-10-29 ENCOUNTER — Ambulatory Visit
Admission: RE | Admit: 2021-10-29 | Discharge: 2021-10-29 | Disposition: A | Discharge status: Home / Self Care | Payer: Self-pay | Source: Ambulatory Visit | Attending: Radiation Oncology | Admitting: Radiation Oncology

## 2021-10-29 DIAGNOSIS — C50212 Malignant neoplasm of upper-inner quadrant of left female breast: Secondary | ICD-10-CM | POA: Insufficient documentation

## 2021-10-29 DIAGNOSIS — Z170 Estrogen receptor positive status [ER+]: Secondary | ICD-10-CM | POA: Insufficient documentation

## 2021-11-01 NOTE — Therapy (Signed)
?OUTPATIENT PHYSICAL THERAPY TREATMENT NOTE ? ? ?Patient Name: Kelli Vaughn ?MRN: [id number] ?DOB:Oct 14, 1955, 66 y.o., female ?Today's Date: 11/02/2021 ?PCP: [name], MD ?REFERRING PROVIDER: [name], MD ? ?END OF SESSION:  ? PT End of Session - 11/02/21 1006   ? ? Visit Number 3   ? Number of Visits 10   ? Date for PT Re-Evaluation 11/24/21   ? PT Start Time 1006   ? PT Stop Time 1050   ? PT Time Calculation (min) 44 min   ? Activity Tolerance Patient tolerated treatment well   ? Behavior During Therapy [facility] for tasks assessed/performed   ? ?  ?  ? ?  ? ? ?Past Medical History:  ?Diagnosis Date  ? Complication of anesthesia   ? Diabetes mellitus without complication ([place])   ? TYPE II  ? Glaucoma   ? Hypertension   ? PONV (postoperative nausea and vomiting)   ? ?Past Surgical History:  ?Procedure Laterality Date  ? BREAST LUMPECTOMY WITH RADIOACTIVE SEED AND SENTINEL LYMPH NODE BIOPSY Left 09/21/2021  ? Procedure: LEFT BREAST LUMPECTOMY WITH RADIOACTIVE SEED X2 AND SENTINEL LYMPH NODE BIOPSY;  Surgeon: [name], MD;  Location: [facility];  Service: General;  Laterality: Left;  ? CESAREAN SECTION    ? X2  ? EYE SURGERY Bilateral 2019  ? cataract surgery  ? RADIOACTIVE SEED GUIDED AXILLARY SENTINEL LYMPH NODE Left 09/21/2021  ? Procedure: RADIOACTIVE SEED GUIDED AXILLARY SENTINEL LYMPH NODE BIOPSY;  Surgeon: [name], MD;  Location: [facility];  Service: General;  Laterality: Left;  ? ?Patient Active Problem List  ? Diagnosis Date Noted  ? Genetic testing 09/17/2021  ? Family history of breast cancer 09/01/2021  ? Malignant neoplasm of upper-inner quadrant of left breast in female, estrogen receptor positive ([place]) 08/31/2021  ? ? ?REFERRING DIAG: left Breast Cancer ? ?THERAPY DIAG:  ?Malignant neoplasm of upper-inner quadrant of left breast in female, estrogen receptor positive ([place]) ? ?Abnormal posture ? ?Stiffness of left shoulder, not elsewhere classified ? ?Localized edema ? ?PERTINENT HISTORY:  Patient was diagnosed on 05/20/2021 with left grade II invasive ductal carcinoma breast cancer. It measures 3.7 cm and is located in the upper inner quadrant. It is ER/PR positive and HER 2 negative with a Ki67 of 5%. 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 Surgery for left lumpectomy with SLNB was performed on 09/21/2021 with 1/5 LN's. Pt had a post op seroma ? ?PRECAUTIONS: Recent Surgery, left UE Lymphedema risk, Other: DM,hypertension ? ?SUBJECTIVE: I did the exercises and I feel better.  Overall pain is 50 % better.I have been wearing the foam and it does feel softer at my breast I had an MRI last week and it was very painful when they moved my arm. ? ?PAIN:  ?Are you having pain? No, its a lot better. ? ? ?OBJECTIVE: (objective measures completed at initial evaluation unless otherwise dated) ? ?OBJECTIVE:  ?  ?PATIENT SURVEYS:  ?QUICK DASH: ?59% ?  ?OBSERVATIONS: ?           Steri strips still present over medial breast incision.  Firmness/swelling noted around incision. Axillary incision healed with mild firmness. Small cord noted in left axillary region that is very tender/hypersensitive for pt. Bruising/Mag trace? Noted lateral to nipple ?  ?POSTURE:  ?Forward head, rounded shoulders ?  ?LYMPHEDEMA ASSESSMENT:  ?  ?A/PROM RIGHT  09/01/2021 ?   ?Shoulder extension 47  ?Shoulder flexion 158  ?Shoulder abduction 154  ?Shoulder  internal rotation 49  ?Shoulder external rotation 76  ?                        (Blank rows = not tested) ?  ?A/PROM LEFT  09/01/2021 10/27/2021 11/01/2021  ?Shoulder extension 46 55   ?Shoulder flexion 148 155 158  ?Shoulder abduction 147 140 154  ?Shoulder internal rotation 63 61   ?Shoulder external rotation 77 90 95  ?                        (Blank rows = not tested) ?  ?  ?CERVICAL AROM: ?All within normal limits ?  ?UPPER EXTREMITY STRENGTH: WFL ?  ?  ?LYMPHEDEMA ASSESSMENTS:  ?  ?[place] RIGHT  09/01/2021  ?10 cm proximal to olecranon process 32.2   ?Olecranon process 26.2  ?10 cm proximal to ulnar styloid process 24.6  ?Just proximal to ulnar styloid process 17.6  ?Across hand at thumb web space 19.4  ?At base of 2nd digit 6.5  ?(Blank rows = not tested) ?  ?[place] LEFT  09/01/2021    ?10 cm proximal to olecranon process 34.5 34.8  ?Olecranon process 27.4 27.4  ?10 cm proximal to ulnar styloid process 23.5 23.2  ?Just proximal to ulnar styloid process 17.5 17.5  ?Across hand at thumb web space 19.1 19.1  ?At base of 2nd digit 6.3 6.1  ?(Blank rows = not tested) ?  ?              ?Surgery type/Date: left lumpectomy with SLNB, 09/21/2021 ?Number of lymph nodes removed: 5, 1 positive ?Current/past treatment (chemo, radiation, hormone therapy): pending radiation, hormone therapy ?Other symptoms:  ?Heaviness/tightness No ?Pain Yes ?Pitting edema No ?Infections No ?Decreased scar mobility Yes ?Stemmer sign No ? ?  ? ?Treatment Today; ?11/02/2021 ? THERAPEUTIC EXERCISES;Clasped hands flexion and stargazer exercises reviewed with pt x 5 reps. Supine wand flexion and scaption x 4, Supine bilateral AROM flexion, scaption, bilateral horizontal abduction x 5 ea ?MANUAL; MFR to cording in left axilla, PROM left shoulder flexion, scaption, abduction, IR and IR, Scar mobilization to axillary incision and pt instructed in same ? ? ?PATIENT EDUCATION:  ?Education details: reviewed home program , supine wand flex and scaption exercises, ?Person educated: Patient ?Education method: Explanation, Demonstration, and Handouts. Interpreted into Spanish by interpreter ?Education comprehension: returned demonstration ?  ?  ?HOME EXERCISE PROGRAM: ?           Reviewed previously given post op HEP. ?But had pt do flexion and star gazer in supine ?  ?ASSESSMENT: ?  ?CLINICAL IMPRESSION: ?Pt has been compliant with her HEP and notes that her overall pain is 50% better.  She is using her arm more for light household chores.  She continues with a thin axillary cord that is uncomfortable.   She has made very good progress with left shoulder AROM ?  ?Pt will benefit from skilled therapeutic intervention to improve on the following deficits: Decreased knowledge of precautions, impaired UE functional use, pain, decreased ROM, postural dysfunction, swelling  ?  ?PT treatment/interventions: ADL/Self care home management, Therapeutic exercises, Therapeutic activity, Patient/Family education, Joint mobilization, Orthotic/Fit training, Manual lymph drainage, scar mobilization, and Manual therapy ?  ?  ?  ?  ?GOALS: ?Goals reviewed with patient? Yes ?  ?LONG TERM GOALS:  (STG=LTG) ?  ?GOALS Name Target Date Goal status  ?1 Pt will demonstrate she has regained full shoulder ROM and  function post operatively compared to baselines.  ?Baseline: 11/24/2021 INITIAL  ?2 Pt will report pain/burning improved by 50% or more 11/24/2021 Achieved ?11/02/2021  ?3 Quick dash will improve to no greater than 25% 11/24/2021 INITIAL  ?4 Pt will be educated in scar massage, and precautions for lymphedema 11/24/2021 INITIAL  ?  ?  ?  ?PLAN: ?PT FREQUENCY/DURATION: 2x/week x 4 weeks prn ?  ?PLAN FOR NEXT SESSION: scar massage to SLNB incision ,check for seroma, MFR to axillary region (cord near skin tag), PROM, STM prn,MLD to left breast,prn progress HEP ?  ? ? ?Claris Pong, PT ?11/02/2021, 10:57 AM ? ?  ? ?

## 2021-11-02 ENCOUNTER — Ambulatory Visit: Payer: No Typology Code available for payment source

## 2021-11-02 DIAGNOSIS — R293 Abnormal posture: Secondary | ICD-10-CM

## 2021-11-02 DIAGNOSIS — C50212 Malignant neoplasm of upper-inner quadrant of left female breast: Secondary | ICD-10-CM

## 2021-11-02 DIAGNOSIS — M25612 Stiffness of left shoulder, not elsewhere classified: Secondary | ICD-10-CM

## 2021-11-02 DIAGNOSIS — R600 Localized edema: Secondary | ICD-10-CM

## 2021-11-02 NOTE — Patient Instructions (Signed)
SHOULDER: Flexion - Supine Kelli Vaughn)        Cancer Rehab [phone] ? ? ? ?Hold cane in both hands. Raise arms up overhead. Do not allow back to arch. Hold _5__ seconds. Do __5__ times; __2__ times a day. ? ? ?Hands shoulder width ?2 Hands slightly wider than shoulder width ?

## 2021-11-04 ENCOUNTER — Ambulatory Visit: Payer: No Typology Code available for payment source

## 2021-11-04 DIAGNOSIS — R293 Abnormal posture: Secondary | ICD-10-CM

## 2021-11-04 DIAGNOSIS — M25612 Stiffness of left shoulder, not elsewhere classified: Secondary | ICD-10-CM

## 2021-11-04 DIAGNOSIS — Z170 Estrogen receptor positive status [ER+]: Secondary | ICD-10-CM

## 2021-11-04 DIAGNOSIS — R600 Localized edema: Secondary | ICD-10-CM

## 2021-11-04 NOTE — Therapy (Signed)
?OUTPATIENT PHYSICAL THERAPY TREATMENT NOTE ? ? ?Patient Name: Kelli Vaughn ?MRN: [id number] ?DOB:08-29-1955, 66 y.o., female ?Today's Date: 11/02/2021 ?PCP: [name], MD ?REFERRING PROVIDER: [name], MD ? ?END OF SESSION:  ? PT End of Session - 11/04/21 1056   ? ? Visit Number 4   ? Number of Visits 10   ? Date for PT Re-Evaluation 11/24/21   ? PT Start Time 1008   ? PT Stop Time 1055   ? PT Time Calculation (min) 47 min   ? Activity Tolerance Patient tolerated treatment well   ? Behavior During Therapy [facility] for tasks assessed/performed   ? ?  ?  ? ?  ? ? ? ?Past Medical History:  ?Diagnosis Date  ? Complication of anesthesia   ? Diabetes mellitus without complication ([place])   ? TYPE II  ? Glaucoma   ? Hypertension   ? PONV (postoperative nausea and vomiting)   ? ?Past Surgical History:  ?Procedure Laterality Date  ? BREAST LUMPECTOMY WITH RADIOACTIVE SEED AND SENTINEL LYMPH NODE BIOPSY Left 09/21/2021  ? Procedure: LEFT BREAST LUMPECTOMY WITH RADIOACTIVE SEED X2 AND SENTINEL LYMPH NODE BIOPSY;  Surgeon: [name], MD;  Location: [place];  Service: General;  Laterality: Left;  ? CESAREAN SECTION    ? X2  ? EYE SURGERY Bilateral 2019  ? cataract surgery  ? RADIOACTIVE SEED GUIDED AXILLARY SENTINEL LYMPH NODE Left 09/21/2021  ? Procedure: RADIOACTIVE SEED GUIDED AXILLARY SENTINEL LYMPH NODE BIOPSY;  Surgeon: [name], MD;  Location: [place];  Service: General;  Laterality: Left;  ? ?Patient Active Problem List  ? Diagnosis Date Noted  ? Genetic testing 09/17/2021  ? Family history of breast cancer 09/01/2021  ? Malignant neoplasm of upper-inner quadrant of left breast in female, estrogen receptor positive ([place]) 08/31/2021  ? ? ?REFERRING DIAG: left Breast Cancer ? ?THERAPY DIAG:  ?Malignant neoplasm of upper-inner quadrant of left breast in female, estrogen receptor positive ([place]) ? ?Abnormal posture ? ?Stiffness of left shoulder, not elsewhere classified ? ?Localized edema ? ?PERTINENT  HISTORY: Patient was diagnosed on 05/20/2021 with left grade II invasive ductal carcinoma breast cancer. It measures 3.7 cm and is located in the upper inner quadrant. It is ER/PR positive and HER 2 negative with a Ki67 of 5%. 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 Surgery for left lumpectomy with SLNB was performed on 09/21/2021 with 1/5 LN's. Pt had a post op seroma ? ?PRECAUTIONS: Recent Surgery, left UE Lymphedema risk, Other: DM,hypertension ? ?SUBJECTIVE:  I did the exercises this am. ? ?PAIN:  ?Are you having pain? No, its a lot better. ? ? ?OBJECTIVE: (objective measures completed at initial evaluation unless otherwise dated) ? ?OBJECTIVE:  ?  ?PATIENT SURVEYS:  ?QUICK DASH: ?59% ?  ?OBSERVATIONS: ?           Steri strips still present over medial breast incision.  Firmness/swelling noted around incision. Axillary incision healed with mild firmness. Small cord noted in left axillary region that is very tender/hypersensitive for pt. Bruising/Mag trace? Noted lateral to nipple ?  ?POSTURE:  ?Forward head, rounded shoulders ?  ?LYMPHEDEMA ASSESSMENT:  ?  ?A/PROM RIGHT  09/01/2021 ?   ?Shoulder extension 47  ?Shoulder flexion 158  ?Shoulder abduction 154  ?Shoulder internal rotation 49  ?Shoulder external rotation 76  ?                        (Blank rows =  not tested) ?  ?A/PROM LEFT  09/01/2021 10/27/2021 11/01/2021  ?Shoulder extension 46 55   ?Shoulder flexion 148 155 158  ?Shoulder abduction 147 140 154  ?Shoulder internal rotation 63 61   ?Shoulder external rotation 77 90 95  ?                        (Blank rows = not tested) ?  ?  ?CERVICAL AROM: ?All within normal limits ?  ?UPPER EXTREMITY STRENGTH: WFL ?  ?  ?LYMPHEDEMA ASSESSMENTS:  ?  ?[place] RIGHT  09/01/2021  ?10 cm proximal to olecranon process 32.2  ?Olecranon process 26.2  ?10 cm proximal to ulnar styloid process 24.6  ?Just proximal to ulnar styloid process 17.6  ?Across hand at thumb web space 19.4  ?At base of 2nd  digit 6.5  ?(Blank rows = not tested) ?  ?[place] LEFT  09/01/2021    ?10 cm proximal to olecranon process 34.5 34.8  ?Olecranon process 27.4 27.4  ?10 cm proximal to ulnar styloid process 23.5 23.2  ?Just proximal to ulnar styloid process 17.5 17.5  ?Across hand at thumb web space 19.1 19.1  ?At base of 2nd digit 6.3 6.1  ?(Blank rows = not tested) ?  ?              ?Surgery type/Date: left lumpectomy with SLNB, 09/21/2021 ?Number of lymph nodes removed: 5, 1 positive ?Current/past treatment (chemo, radiation, hormone therapy): pending radiation, hormone therapy ?Other symptoms:  ?Heaviness/tightness No ?Pain Yes ?Pitting edema No ?Infections No ?Decreased scar mobility Yes ?Stemmer sign No ? ?  ? ?Treatment Today; ? ?11/04/2021 ?THERAPEUTIC EXERCISES ?Pulleys flexion, scaption and abd x 2 min ea. Demonstration by PT first, ball rolls x 10 for flexion and 5 for abduction, Supine bilateral AROM flexion, scaption, horizontal abduction ?MANUAL; MFR to cording in left axilla,  Soft tissue mobilization to left pectorals, lats and UT in supine with cocoa butter.PROM left shoulder flexion, scaption, abduction, IR and IR, Scar mobilization to axillary incision  ? ? ?11/02/2021 ? THERAPEUTIC EXERCISES;Clasped hands flexion and stargazer exercises reviewed with pt x 5 reps. Supine wand flexion and scaption x 4, Supine bilateral AROM flexion, scaption, bilateral horizontal abduction x 5 ea ?MANUAL; MFR to cording in left axilla, PROM left shoulder flexion, scaption, abduction, IR and IR, Scar mobilization to axillary incision and pt instructed in same ? ? ?PATIENT EDUCATION:  ?Education details: reviewed home program , supine wand flex and scaption exercises, ?Person educated: Patient ?Education method: Explanation, Demonstration, and Handouts. Interpreted into Spanish by interpreter ?Education comprehension: returned demonstration ?  ?  ?HOME EXERCISE PROGRAM: ?           Reviewed previously given post op HEP. ?But had pt do  flexion and star gazer in supine ?  ?ASSESSMENT: ?  ?CLINICAL IMPRESSION: ?Pt is very compliant with exercises. She did well with pullleys and ball rolls, and was able to return demonstrate with good form.  Tender with soft tissue work at left lateral trunk/lats and mildly at pectorals. Still tender over very thin cord in axilla. Making excellent progress. ?  ?Pt will benefit from skilled therapeutic intervention to improve on the following deficits: Decreased knowledge of precautions, impaired UE functional use, pain, decreased ROM, postural dysfunction, swelling  ?  ?PT treatment/interventions: ADL/Self care home management, Therapeutic exercises, Therapeutic activity, Patient/Family education, Joint mobilization, Orthotic/Fit training, Manual lymph drainage, scar mobilization, and Manual therapy ?  ?  ?  ?  ?  GOALS: ?Goals reviewed with patient? Yes ?  ?LONG TERM GOALS:  (STG=LTG) ?  ?GOALS Name Target Date Goal status  ?1 Pt will demonstrate she has regained full shoulder ROM and function post operatively compared to baselines.  ?Baseline: 11/24/2021 INITIAL  ?2 Pt will report pain/burning improved by 50% or more 11/24/2021 Achieved ?11/02/2021  ?3 Quick dash will improve to no greater than 25% 11/24/2021 INITIAL  ?4 Pt will be educated in scar massage, and precautions for lymphedema 11/24/2021 INITIAL  ?  ?  ?  ?PLAN: ?PT FREQUENCY/DURATION: 2x/week x 4 weeks prn ?  ?PLAN FOR NEXT SESSION: scar massage to SLNB incision ,check for seroma, MFR to axillary region (cord near skin tag), PROM, STM prn,MLD to left breast,prn progress HEP ?  ? ? ?Claris Pong, PT ?11/04/2021, 11:02 AM ? ?  ? ?

## 2021-11-09 ENCOUNTER — Ambulatory Visit: Payer: No Typology Code available for payment source | Attending: General Surgery

## 2021-11-09 DIAGNOSIS — R293 Abnormal posture: Secondary | ICD-10-CM | POA: Insufficient documentation

## 2021-11-09 DIAGNOSIS — R600 Localized edema: Secondary | ICD-10-CM | POA: Insufficient documentation

## 2021-11-09 DIAGNOSIS — Z170 Estrogen receptor positive status [ER+]: Secondary | ICD-10-CM | POA: Insufficient documentation

## 2021-11-09 DIAGNOSIS — M25612 Stiffness of left shoulder, not elsewhere classified: Secondary | ICD-10-CM | POA: Insufficient documentation

## 2021-11-09 DIAGNOSIS — C50212 Malignant neoplasm of upper-inner quadrant of left female breast: Secondary | ICD-10-CM | POA: Insufficient documentation

## 2021-11-09 NOTE — Therapy (Signed)
?OUTPATIENT PHYSICAL THERAPY TREATMENT NOTE ? ? ?Patient Name: Kelli Vaughn ?MRN: [id number] ?DOB:[birth year], 66 y.o., female ?Today's Date: 11/02/2021 ?PCP: [name], MD ?REFERRING PROVIDER: [name], MD ? ?END OF SESSION:  ? PT End of Session - 11/09/21 1005   ? ? Visit Number 5   ? Number of Visits 10   ? Date for PT Re-Evaluation 11/24/21   ? PT Start Time 1005   ? PT Stop Time 4098   ? PT Time Calculation (min) 48 min   ? Activity Tolerance Patient tolerated treatment well   ? Behavior During Therapy [facility] for tasks assessed/performed   ? ?  ?  ? ?  ? ? ? ?Past Medical History:  ?Diagnosis Date  ? Complication of anesthesia   ? Diabetes mellitus without complication ([place])   ? TYPE II  ? Glaucoma   ? Hypertension   ? PONV (postoperative nausea and vomiting)   ? ?Past Surgical History:  ?Procedure Laterality Date  ? BREAST LUMPECTOMY WITH RADIOACTIVE SEED AND SENTINEL LYMPH NODE BIOPSY Left 09/21/2021  ? Procedure: LEFT BREAST LUMPECTOMY WITH RADIOACTIVE SEED X2 AND SENTINEL LYMPH NODE BIOPSY;  Surgeon: [name], MD;  Location: [place];  Service: General;  Laterality: Left;  ? CESAREAN SECTION    ? X2  ? EYE SURGERY Bilateral 2019  ? cataract surgery  ? RADIOACTIVE SEED GUIDED AXILLARY SENTINEL LYMPH NODE Left 09/21/2021  ? Procedure: RADIOACTIVE SEED GUIDED AXILLARY SENTINEL LYMPH NODE BIOPSY;  Surgeon: [name], MD;  Location: [place];  Service: General;  Laterality: Left;  ? ?Patient Active Problem List  ? Diagnosis Date Noted  ? Genetic testing 09/17/2021  ? Family history of breast cancer 09/01/2021  ? Malignant neoplasm of upper-inner quadrant of left breast in female, estrogen receptor positive ([place]) 08/31/2021  ? ? ?REFERRING DIAG: left Breast Cancer ? ?THERAPY DIAG:  ?Malignant neoplasm of upper-inner quadrant of left breast in female, estrogen receptor positive ([place]) ? ?Abnormal posture ? ?Stiffness of left shoulder, not elsewhere classified ? ?Localized edema ? ?PERTINENT  HISTORY: Patient was diagnosed on 05/20/2021 with left grade II invasive ductal carcinoma breast cancer. It measures 3.7 cm and is located in the upper inner quadrant. It is ER/PR positive and HER 2 negative with a Ki67 of 5%. 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 Surgery for left lumpectomy with SLNB was performed on 09/21/2021 with 1/5 LN's. Pt had a post op seroma ? ?PRECAUTIONS: Recent Surgery, left UE Lymphedema risk, Other: DM,hypertension ? ?SUBJECTIVE:  Doing well overall. No real difficulties, but still get some tightness  in her left armpit with a certain movement . She starts radiation on Thursday this week. Pts son interpreted because interpreter did not come. ?PAIN:  ?Are you having pain? No, its a lot better. ? ? ?OBJECTIVE: (objective measures completed at initial evaluation unless otherwise dated) ? ?OBJECTIVE:  ?  ?PATIENT SURVEYS:  ?QUICK DASH: ?59% ?  ?OBSERVATIONS: ?           Steri strips still present over medial breast incision.  Firmness/swelling noted around incision. Axillary incision healed with mild firmness. Small cord noted in left axillary region that is very tender/hypersensitive for pt. Bruising/Mag trace? Noted lateral to nipple ?  ?POSTURE:  ?Forward head, rounded shoulders ?  ?LYMPHEDEMA ASSESSMENT:  ?  ?A/PROM RIGHT  09/01/2021 ?   ?Shoulder extension 47  ?Shoulder flexion 158  ?Shoulder abduction 154  ?Shoulder internal rotation 49  ?Shoulder external rotation  76  ?                        (Blank rows = not tested) ?  ?A/PROM LEFT  09/01/2021 10/27/2021 11/01/2021  ?Shoulder extension 46 55   ?Shoulder flexion 148 155 158  ?Shoulder abduction 147 140 154  ?Shoulder internal rotation 63 61   ?Shoulder external rotation 77 90 95  ?                        (Blank rows = not tested) ?  ?  ?CERVICAL AROM: ?All within normal limits ?  ?UPPER EXTREMITY STRENGTH: WFL ?  ?  ?LYMPHEDEMA ASSESSMENTS:  ?  ?[place] RIGHT  09/01/2021  ?10 cm proximal to olecranon  process 32.2  ?Olecranon process 26.2  ?10 cm proximal to ulnar styloid process 24.6  ?Just proximal to ulnar styloid process 17.6  ?Across hand at thumb web space 19.4  ?At base of 2nd digit 6.5  ?(Blank rows = not tested) ?  ?[place] LEFT  09/01/2021    ?10 cm proximal to olecranon process 34.5 34.8  ?Olecranon process 27.4 27.4  ?10 cm proximal to ulnar styloid process 23.5 23.2  ?Just proximal to ulnar styloid process 17.5 17.5  ?Across hand at thumb web space 19.1 19.1  ?At base of 2nd digit 6.3 6.1  ?(Blank rows = not tested) ?  ?              ?Surgery type/Date: left lumpectomy with SLNB, 09/21/2021 ?Number of lymph nodes removed: 5, 1 positive ?Current/past treatment (chemo, radiation, hormone therapy): pending radiation, hormone therapy ?Other symptoms:  ?Heaviness/tightness No ?Pain Yes ?Pitting edema No ?Infections No ?Decreased scar mobility Yes ?Stemmer sign No ? ?  ? ?Treatment Today; ?11/09/2021 ?THERAPEUTIC EXERCISES ?Pulleys flexion, scaption and abd x 2 min ea. Demonstration by PT first, ball rolls x 10 for flexion . Supine bilateral UE horizontal abd with yellow x5, ER with yellow x 5, and flexion with yellow x 5. Updated HEP ?MANUAL; MFR to cording in left axilla,  Soft tissue mobilization to left pectorals, lats and UT in supine with cocoa butter.PROM left shoulder flexion, scaption, abduction, IR and IR, Scar mobilization to axillary incision ? ? ?11/04/2021 ?THERAPEUTIC EXERCISES ?Pulleys flexion, scaption and abd x 2 min ea. Demonstration by PT first, ball rolls x 10 for flexion and 5 for abduction, Supine bilateral AROM flexion, scaption, horizontal abduction ?MANUAL; MFR to cording in left axilla,  Soft tissue mobilization to left pectorals, lats and UT in supine with cocoa butter.PROM left shoulder flexion, scaption, abduction, IR and IR, Scar mobilization to axillary incision  ? ? ?11/02/2021 ? THERAPEUTIC EXERCISES;Clasped hands flexion and stargazer exercises reviewed with pt x 5 reps.  Supine wand flexion and scaption x 4, Supine bilateral AROM flexion, scaption, bilateral horizontal abduction x 5 ea ?MANUAL; MFR to cording in left axilla, PROM left shoulder flexion, scaption, abduction, IR and IR, Scar mobilization to axillary incision and pt instructed in same ? ? ?PATIENT EDUCATION:  ?Education details: reviewed home program , supine scap series except sword ?Person educated: Patient ?Education method: Explanation, Demonstration, and Handouts. Interpreted by son ?Education comprehension: returned demonstration ?  ?  ?HOME EXERCISE PROGRAM: ?           Reviewed previously given post op HEP. ?But had pt do flexion and star gazer in supine, supine scap stabs except sword ?  ?ASSESSMENT: ?  ?CLINICAL  IMPRESSION: ?Pt continues to be tender from the small cord in her armpit and to have mild pain at end ranges of certain motions. She has tenderness in the sternal and axillary border of left pecs. She will start radiation on Thursday. HEP updated with supine scapular series except sword x 5 ?  ?Pt will benefit from skilled therapeutic intervention to improve on the following deficits: Decreased knowledge of precautions, impaired UE functional use, pain, decreased ROM, postural dysfunction, swelling  ?  ?PT treatment/interventions: ADL/Self care home management, Therapeutic exercises, Therapeutic activity, Patient/Family education, Joint mobilization, Orthotic/Fit training, Manual lymph drainage, scar mobilization, and Manual therapy ?  ?  ?  ?  ?GOALS: ?Goals reviewed with patient? Yes ?  ?LONG TERM GOALS:  (STG=LTG) ?  ?GOALS Name Target Date Goal status  ?1 Pt will demonstrate she has regained full shoulder ROM and function post operatively compared to baselines.  ?Baseline: 11/24/2021 INITIAL  ?2 Pt will report pain/burning improved by 50% or more 11/24/2021 Achieved ?11/02/2021  ?3 Quick dash will improve to no greater than 25% 11/24/2021 INITIAL  ?4 Pt will be educated in scar massage, and precautions  for lymphedema 11/24/2021 INITIAL  ?  ?  ?  ?PLAN: ?PT FREQUENCY/DURATION: 2x/week x 4 weeks prn ?  ?PLAN FOR NEXT SESSION: check goals, assess breast for swelling, review supine scap series except sword) scar mas

## 2021-11-09 NOTE — Patient Instructions (Signed)
Over Head Pull: Narrow and Wide Grip   Cancer Re[facility] [phone]   On back, knees bent, feet flat, band across thighs, elbows straight but relaxed. Pull hands apart (start). Keeping elbows straight, bring arms up and over head, hands toward floor. Keep pull steady on band. Hold momentarily. Return slowly, keeping pull steady, back to start. Then do same with a wider grip on the band (past shoulder width) Repeat _5-10__ times. Band color __yellow____   Side Pull: Double Arm   On back, knees bent, feet flat. Arms perpendicular to body, shoulder level, elbows straight but relaxed. Pull arms out to sides, elbows straight. Resistance band comes across collarbones, hands toward floor. Hold momentarily. Slowly return to starting position. Repeat _5-10__ times. Band color _yellow____   Shoulder Rotation: Double Arm   On back, knees bent, feet flat, elbows tucked at sides, bent 90, hands palms up. Pull hands apart and down toward floor, keeping elbows near sides. Hold momentarily. Slowly return to starting position. Repeat _5-10__ times. Band color __yellow____    

## 2021-11-11 ENCOUNTER — Ambulatory Visit: Payer: No Typology Code available for payment source | Admitting: Rehabilitation

## 2021-11-11 ENCOUNTER — Other Ambulatory Visit: Payer: Self-pay

## 2021-11-11 ENCOUNTER — Encounter: Payer: Self-pay | Admitting: *Deleted

## 2021-11-11 ENCOUNTER — Ambulatory Visit
Admission: RE | Admit: 2021-11-11 | Discharge: 2021-11-11 | Disposition: A | Discharge status: Home / Self Care | Payer: No Typology Code available for payment source | Source: Ambulatory Visit | Attending: Radiation Oncology | Admitting: Radiation Oncology

## 2021-11-11 DIAGNOSIS — R293 Abnormal posture: Secondary | ICD-10-CM

## 2021-11-11 DIAGNOSIS — Z170 Estrogen receptor positive status [ER+]: Secondary | ICD-10-CM | POA: Insufficient documentation

## 2021-11-11 DIAGNOSIS — R600 Localized edema: Secondary | ICD-10-CM

## 2021-11-11 DIAGNOSIS — M25612 Stiffness of left shoulder, not elsewhere classified: Secondary | ICD-10-CM

## 2021-11-11 DIAGNOSIS — C50212 Malignant neoplasm of upper-inner quadrant of left female breast: Secondary | ICD-10-CM | POA: Insufficient documentation

## 2021-11-11 LAB — RAD ONC ARIA SESSION SUMMARY
Course Elapsed Days: 0
Plan Fractions Treated to Date: 1
Plan Prescribed Dose Per Fraction: 2.66 Gy
Plan Total Fractions Prescribed: 16
Plan Total Prescribed Dose: 42.56 Gy
Reference Point Dosage Given to Date: 2.66 Gy
Reference Point Session Dosage Given: 2.66 Gy
Session Number: 1

## 2021-11-11 NOTE — Therapy (Signed)
?OUTPATIENT PHYSICAL THERAPY TREATMENT NOTE ? ? ?Patient Name: Kelli Vaughn ?MRN: [id number] ?DOB:02-05-1956, 66 y.o., female ?Today's Date: 11/02/2021 ?PCP: [name], MD ?REFERRING PROVIDER: [name], MD ? ?END OF SESSION:  ? PT End of Session - 11/11/21 1332   ? ? Visit Number 6   ? Number of Visits 10   ? Date for PT Re-Evaluation 11/24/21   ? PT Start Time 1105   ? PT Stop Time 1154   ? PT Time Calculation (min) 49 min   ? Activity Tolerance Patient tolerated treatment well   ? Behavior During Therapy [facility] for tasks assessed/performed   ? ?  ?  ? ?  ? ? ? ? ?Past Medical History:  ?Diagnosis Date  ? Complication of anesthesia   ? Diabetes mellitus without complication ([place])   ? TYPE II  ? Glaucoma   ? Hypertension   ? PONV (postoperative nausea and vomiting)   ? ?Past Surgical History:  ?Procedure Laterality Date  ? BREAST LUMPECTOMY WITH RADIOACTIVE SEED AND SENTINEL LYMPH NODE BIOPSY Left 09/21/2021  ? Procedure: LEFT BREAST LUMPECTOMY WITH RADIOACTIVE SEED X2 AND SENTINEL LYMPH NODE BIOPSY;  Surgeon: [name], MD;  Location: [place];  Service: General;  Laterality: Left;  ? CESAREAN SECTION    ? X2  ? EYE SURGERY Bilateral 2019  ? cataract surgery  ? RADIOACTIVE SEED GUIDED AXILLARY SENTINEL LYMPH NODE Left 09/21/2021  ? Procedure: RADIOACTIVE SEED GUIDED AXILLARY SENTINEL LYMPH NODE BIOPSY;  Surgeon: [name], MD;  Location: [place];  Service: General;  Laterality: Left;  ? ?Patient Active Problem List  ? Diagnosis Date Noted  ? Genetic testing 09/17/2021  ? Family history of breast cancer 09/01/2021  ? Malignant neoplasm of upper-inner quadrant of left breast in female, estrogen receptor positive ([place]) 08/31/2021  ? ? ?REFERRING DIAG: left Breast Cancer ? ?THERAPY DIAG:  ?Malignant neoplasm of upper-inner quadrant of left breast in female, estrogen receptor positive ([place]) ? ?Abnormal posture ? ?Stiffness of left shoulder, not elsewhere classified ? ?Localized edema ? ?PERTINENT  HISTORY: Patient was diagnosed on 05/20/2021 with left grade II invasive ductal carcinoma breast cancer. It measures 3.7 cm and is located in the upper inner quadrant. It is ER/PR positive and HER 2 negative with a Ki67 of 5%. 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 Surgery for left lumpectomy with SLNB was performed on 09/21/2021 with 1/5 LN's. Pt had a post op seroma ? ?PRECAUTIONS: Recent Surgery, left UE Lymphedema risk, Other: DM,hypertension ? ?SUBJECTIVE:  I start radiation today.  I feel like we should continue therapy.  I think we should still work on the cording in the arm.   ? ?PAIN:  ?Are you having pain? No, its a lot better. ? ? ?OBJECTIVE: (objective measures completed at initial evaluation unless otherwise dated) ? ?OBJECTIVE:  ?  ?PATIENT SURVEYS:  ?QUICK DASH: ?59% ?  ?OBSERVATIONS: ?           Steri strips still present over medial breast incision.  Firmness/swelling noted around incision. Axillary incision healed with mild firmness. Small cord noted in left axillary region that is very tender/hypersensitive for pt. Bruising/Mag trace? Noted lateral to nipple ?  ?POSTURE:  ?Forward head, rounded shoulders ?  ?LYMPHEDEMA ASSESSMENT:  ?  ?A/PROM RIGHT  09/01/2021 ?   ?Shoulder extension 47  ?Shoulder flexion 158  ?Shoulder abduction 154  ?Shoulder internal rotation 49  ?Shoulder external rotation 76  ?                        (  Blank rows = not tested) ?  ?A/PROM LEFT  09/01/2021 10/27/2021 11/01/2021  ?Shoulder extension 46 55   ?Shoulder flexion 148 155 158  ?Shoulder abduction 147 140 154  ?Shoulder internal rotation 63 61   ?Shoulder external rotation 77 90 95  ?                        (Blank rows = not tested) ?  ?  ?CERVICAL AROM: ?All within normal limits ?  ?UPPER EXTREMITY STRENGTH: WFL ?  ?  ?LYMPHEDEMA ASSESSMENTS:  ?  ?[place] RIGHT  09/01/2021  ?10 cm proximal to olecranon process 32.2  ?Olecranon process 26.2  ?10 cm proximal to ulnar styloid process 24.6   ?Just proximal to ulnar styloid process 17.6  ?Across hand at thumb web space 19.4  ?At base of 2nd digit 6.5  ?(Blank rows = not tested) ?  ?[place] LEFT  09/01/2021    ?10 cm proximal to olecranon process 34.5 34.8  ?Olecranon process 27.4 27.4  ?10 cm proximal to ulnar styloid process 23.5 23.2  ?Just proximal to ulnar styloid process 17.5 17.5  ?Across hand at thumb web space 19.1 19.1  ?At base of 2nd digit 6.3 6.1  ?(Blank rows = not tested) ?  ?              ?Surgery type/Date: left lumpectomy with SLNB, 09/21/2021 ?Number of lymph nodes removed: 5, 1 positive ?Current/past treatment (chemo, radiation, hormone therapy): pending radiation, hormone therapy ?Other symptoms:  ?Heaviness/tightness No ?Pain Yes ?Pitting edema No ?Infections No ?Decreased scar mobility Yes ?Stemmer sign No ? ?  ? ?Treatment Today; ?11/11/21 ?THERAPEUTIC EXERCISES ?Pulleys flexion, scaption and abd x 2 min ea. ball rolls x 10 for flexion . Supine bilateral UE horizontal abd with yellow x5, ER with yellow x 5, and flexion with yellow x 5. ?MANUAL; MFR to cording in left axilla,  PROM left shoulder flexion, scaption, abduction, IR and IR, Scar mobilization to axillary incision ? ?11/09/2021 ?THERAPEUTIC EXERCISES ?Pulleys flexion, scaption and abd x 2 min ea. Demonstration by PT first, ball rolls x 10 for flexion . Supine bilateral UE horizontal abd with yellow x5, ER with yellow x 5, and flexion with yellow x 5. Updated HEP ?MANUAL; MFR to cording in left axilla,  Soft tissue mobilization to left pectorals, lats and UT in supine with cocoa butter.PROM left shoulder flexion, scaption, abduction, IR and IR, Scar mobilization to axillary incision ? ? ?11/04/2021 ?THERAPEUTIC EXERCISES ?Pulleys flexion, scaption and abd x 2 min ea. Demonstration by PT first, ball rolls x 10 for flexion and 5 for abduction, Supine bilateral AROM flexion, scaption, horizontal abduction ?MANUAL; MFR to cording in left axilla,  Soft tissue mobilization to left  pectorals, lats and UT in supine with cocoa butter.PROM left shoulder flexion, scaption, abduction, IR and IR, Scar mobilization to axillary incision  ? ? ?PATIENT EDUCATION:  ?Education details: reviewed home program , supine scap series except sword ?Person educated: Patient ?Education method: Explanation, Demonstration, and Handouts. Interpreted by son ?Education comprehension: returned demonstration ?  ?  ?HOME EXERCISE PROGRAM: ?           Reviewed previously given post op HEP. ?But had pt do flexion and star gazer in supine, supine scap stabs except sword ?  ?ASSESSMENT: ?  ?CLINICAL IMPRESSION: ?Pt is ready to start radiation today.  Pt has excellent AROM and PROM but continues with intermittent upper arm cording pain with random movements  and does not feel 100%.  ?  ?Pt will benefit from skilled therapeutic intervention to improve on the following deficits: Decreased knowledge of precautions, impaired UE functional use, pain, decreased ROM, postural dysfunction, swelling  ?  ?PT treatment/interventions: ADL/Self care home management, Therapeutic exercises, Therapeutic activity, Patient/Family education, Joint mobilization, Orthotic/Fit training, Manual lymph drainage, scar mobilization, and Manual therapy ?  ?  ?  ?  ?GOALS: ?Goals reviewed with patient? Yes ?  ?LONG TERM GOALS:  (STG=LTG) ?  ?GOALS Name Target Date Goal status  ?1 Pt will demonstrate she has regained full shoulder ROM and function post operatively compared to baselines.  ?Baseline: 11/24/2021 INITIAL  ?2 Pt will report pain/burning improved by 50% or more 11/24/2021 Achieved ?11/02/2021  ?3 Quick dash will improve to no greater than 25% 11/24/2021 INITIAL  ?4 Pt will be educated in scar massage, and precautions for lymphedema 11/24/2021 INITIAL  ?  ?  ?  ?PLAN: ?PT FREQUENCY/DURATION: 2x/week x 4 weeks prn ?  ?PLAN FOR NEXT SESSION: PROM, STM prn (pt starting radiation so no lotions),progress HEP and TE ?  ? ? ?Stark Bray, PT ?11/11/2021, 1:34  PM ? ?  ? ?

## 2021-11-12 ENCOUNTER — Ambulatory Visit
Admission: RE | Admit: 2021-11-12 | Discharge: 2021-11-12 | Disposition: A | Discharge status: Home / Self Care | Payer: No Typology Code available for payment source | Source: Ambulatory Visit | Attending: Radiation Oncology | Admitting: Radiation Oncology

## 2021-11-12 ENCOUNTER — Other Ambulatory Visit: Payer: Self-pay

## 2021-11-12 DIAGNOSIS — Z170 Estrogen receptor positive status [ER+]: Secondary | ICD-10-CM

## 2021-11-12 LAB — RAD ONC ARIA SESSION SUMMARY
Course Elapsed Days: 1
Plan Fractions Treated to Date: 2
Plan Prescribed Dose Per Fraction: 2.66 Gy
Plan Total Fractions Prescribed: 16
Plan Total Prescribed Dose: 42.56 Gy
Reference Point Dosage Given to Date: 5.32 Gy
Reference Point Session Dosage Given: 2.66 Gy
Session Number: 2

## 2021-11-12 MED ORDER — RADIAPLEXRX EX GEL: Freq: Once | CUTANEOUS | Status: AC

## 2021-11-12 MED ORDER — ALRA NON-METALLIC DEODORANT (RAD-ONC)
1.0000 "application " | Freq: Once | TOPICAL | Status: AC
  Administered 2021-11-12: 1 via TOPICAL

## 2021-11-12 NOTE — Progress Notes (Addendum)
Pt here for patient teaching.  Pt given Radiation and You booklet, skin care instructions, alra deodorant and Radiaplex.    Reviewed areas of pertinence such as fatigue, hair loss, skin changes, breast tenderness, and breast swelling. Pt able to give teach back of to pat skin and use unscented/gentle soap,apply Radiaplex bid, avoid applying anything to skin within 4 hours of treatment, avoid wearing an under wire bra, and to use an electric razor if they must shave. Pt verbalizes understanding of information given and will contact nursing with any questions or concerns.  Her son was present for education.  Mozella Rexrode M. Jameil Whitmoyer RN, BSN  

## 2021-11-15 ENCOUNTER — Other Ambulatory Visit: Payer: Self-pay

## 2021-11-15 ENCOUNTER — Ambulatory Visit
Admission: RE | Admit: 2021-11-15 | Discharge: 2021-11-15 | Disposition: A | Discharge status: Home / Self Care | Payer: No Typology Code available for payment source | Source: Ambulatory Visit | Attending: Radiation Oncology | Admitting: Radiation Oncology

## 2021-11-15 LAB — RAD ONC ARIA SESSION SUMMARY
Course Elapsed Days: 4
Plan Fractions Treated to Date: 3
Plan Prescribed Dose Per Fraction: 2.66 Gy
Plan Total Fractions Prescribed: 16
Plan Total Prescribed Dose: 42.56 Gy
Reference Point Dosage Given to Date: 7.98 Gy
Reference Point Session Dosage Given: 2.66 Gy
Session Number: 3

## 2021-11-15 NOTE — Therapy (Incomplete)
?OUTPATIENT PHYSICAL THERAPY TREATMENT NOTE ? ? ?Patient Name: Kelli Vaughn ?MRN: [id number] ?DOB:1955-08-31, 66 y.o., female ?Today's Date: 11/02/2021 ?PCP: [name], MD ?REFERRING PROVIDER: [name], MD ? ?END OF SESSION:  ? ? ? ? ? ?Past Medical History:  ?Diagnosis Date  ? Complication of anesthesia   ? Diabetes mellitus without complication ([place])   ? TYPE II  ? Glaucoma   ? Hypertension   ? PONV (postoperative nausea and vomiting)   ? ?Past Surgical History:  ?Procedure Laterality Date  ? BREAST LUMPECTOMY WITH RADIOACTIVE SEED AND SENTINEL LYMPH NODE BIOPSY Left 09/21/2021  ? Procedure: LEFT BREAST LUMPECTOMY WITH RADIOACTIVE SEED X2 AND SENTINEL LYMPH NODE BIOPSY;  Surgeon: [name], MD;  Location: [place];  Service: General;  Laterality: Left;  ? CESAREAN SECTION    ? X2  ? EYE SURGERY Bilateral 2019  ? cataract surgery  ? RADIOACTIVE SEED GUIDED AXILLARY SENTINEL LYMPH NODE Left 09/21/2021  ? Procedure: RADIOACTIVE SEED GUIDED AXILLARY SENTINEL LYMPH NODE BIOPSY;  Surgeon: [name], MD;  Location: [place];  Service: General;  Laterality: Left;  ? ?Patient Active Problem List  ? Diagnosis Date Noted  ? Genetic testing 09/17/2021  ? Family history of breast cancer 09/01/2021  ? Malignant neoplasm of upper-inner quadrant of left breast in female, estrogen receptor positive ([place]) 08/31/2021  ? ? ?REFERRING DIAG: left Breast Cancer ? ?THERAPY DIAG:  ?No diagnosis found. ? ?PERTINENT HISTORY: Patient was diagnosed on 05/20/2021 with left grade II invasive ductal carcinoma breast cancer. It measures 3.7 cm and is located in the upper inner quadrant. It is ER/PR positive and HER 2 negative with a Ki67 of 5%. 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 Surgery for left lumpectomy with SLNB was performed on 09/21/2021 with 1/5 LN's. Pt had a post op seroma ? ?PRECAUTIONS: Recent Surgery, left UE Lymphedema risk, Other: DM,hypertension ? ?SUBJECTIVE:  I start  radiation today.  I feel like we should continue therapy.  I think we should still work on the cording in the arm.   ? ?PAIN:  ?Are you having pain? No, its a lot better. ? ? ?OBJECTIVE: (objective measures completed at initial evaluation unless otherwise dated) ? ?OBJECTIVE:  ?  ?PATIENT SURVEYS:  ?QUICK DASH: ?59% ?  ?OBSERVATIONS: ?           Steri strips still present over medial breast incision.  Firmness/swelling noted around incision. Axillary incision healed with mild firmness. Small cord noted in left axillary region that is very tender/hypersensitive for pt. Bruising/Mag trace? Noted lateral to nipple ?  ?POSTURE:  ?Forward head, rounded shoulders ?  ?LYMPHEDEMA ASSESSMENT:  ?  ?A/PROM RIGHT  09/01/2021 ?   ?Shoulder extension 47  ?Shoulder flexion 158  ?Shoulder abduction 154  ?Shoulder internal rotation 49  ?Shoulder external rotation 76  ?                        (Blank rows = not tested) ?  ?A/PROM LEFT  09/01/2021 10/27/2021 11/01/2021  ?Shoulder extension 46 55   ?Shoulder flexion 148 155 158  ?Shoulder abduction 147 140 154  ?Shoulder internal rotation 63 61   ?Shoulder external rotation 77 90 95  ?                        (Blank rows = not tested) ?  ?  ?CERVICAL AROM: ?All within normal limits ?  ?  UPPER EXTREMITY STRENGTH: WFL ?  ?  ?LYMPHEDEMA ASSESSMENTS:  ?  ?[place] RIGHT  09/01/2021  ?10 cm proximal to olecranon process 32.2  ?Olecranon process 26.2  ?10 cm proximal to ulnar styloid process 24.6  ?Just proximal to ulnar styloid process 17.6  ?Across hand at thumb web space 19.4  ?At base of 2nd digit 6.5  ?(Blank rows = not tested) ?  ?[place] LEFT  09/01/2021    ?10 cm proximal to olecranon process 34.5 34.8  ?Olecranon process 27.4 27.4  ?10 cm proximal to ulnar styloid process 23.5 23.2  ?Just proximal to ulnar styloid process 17.5 17.5  ?Across hand at thumb web space 19.1 19.1  ?At base of 2nd digit 6.3 6.1  ?(Blank rows = not tested) ?  ?              ?Surgery type/Date: left lumpectomy with  SLNB, 09/21/2021 ?Number of lymph nodes removed: 5, 1 positive ?Current/past treatment (chemo, radiation, hormone therapy): pending radiation, hormone therapy ?Other symptoms:  ?Heaviness/tightness No ?Pain Yes ?Pitting edema No ?Infections No ?Decreased scar mobility Yes ?Stemmer sign No ? ?  ? ?Treatment Today; ?11/11/21 ?THERAPEUTIC EXERCISES ?Pulleys flexion, scaption and abd x 2 min ea. ball rolls x 10 for flexion . Supine bilateral UE horizontal abd with yellow x5, ER with yellow x 5, and flexion with yellow x 5. ?MANUAL; MFR to cording in left axilla,  PROM left shoulder flexion, scaption, abduction, IR and IR, Scar mobilization to axillary incision ? ?11/09/2021 ?THERAPEUTIC EXERCISES ?Pulleys flexion, scaption and abd x 2 min ea. Demonstration by PT first, ball rolls x 10 for flexion . Supine bilateral UE horizontal abd with yellow x5, ER with yellow x 5, and flexion with yellow x 5. Updated HEP ?MANUAL; MFR to cording in left axilla,  Soft tissue mobilization to left pectorals, lats and UT in supine with cocoa butter.PROM left shoulder flexion, scaption, abduction, IR and IR, Scar mobilization to axillary incision ? ? ?11/04/2021 ?THERAPEUTIC EXERCISES ?Pulleys flexion, scaption and abd x 2 min ea. Demonstration by PT first, ball rolls x 10 for flexion and 5 for abduction, Supine bilateral AROM flexion, scaption, horizontal abduction ?MANUAL; MFR to cording in left axilla,  Soft tissue mobilization to left pectorals, lats and UT in supine with cocoa butter.PROM left shoulder flexion, scaption, abduction, IR and IR, Scar mobilization to axillary incision  ? ? ?PATIENT EDUCATION:  ?Education details: reviewed home program , supine scap series except sword ?Person educated: Patient ?Education method: Explanation, Demonstration, and Handouts. Interpreted by son ?Education comprehension: returned demonstration ?  ?  ?HOME EXERCISE PROGRAM: ?           Reviewed previously given post op HEP. ?But had pt do flexion and  star gazer in supine, supine scap stabs except sword ?  ?ASSESSMENT: ?  ?CLINICAL IMPRESSION: ?Pt is ready to start radiation today.  Pt has excellent AROM and PROM but continues with intermittent upper arm cording pain with random movements and does not feel 100%.  ?  ?Pt will benefit from skilled therapeutic intervention to improve on the following deficits: Decreased knowledge of precautions, impaired UE functional use, pain, decreased ROM, postural dysfunction, swelling  ?  ?PT treatment/interventions: ADL/Self care home management, Therapeutic exercises, Therapeutic activity, Patient/Family education, Joint mobilization, Orthotic/Fit training, Manual lymph drainage, scar mobilization, and Manual therapy ?  ?  ?  ?  ?GOALS: ?Goals reviewed with patient? Yes ?  ?LONG TERM GOALS:  (STG=LTG) ?  ?GOALS Name  Target Date Goal status  ?1 Pt will demonstrate she has regained full shoulder ROM and function post operatively compared to baselines.  ?Baseline: 11/24/2021 INITIAL  ?2 Pt will report pain/burning improved by 50% or more 11/24/2021 Achieved ?11/02/2021  ?3 Quick dash will improve to no greater than 25% 11/24/2021 INITIAL  ?4 Pt will be educated in scar massage, and precautions for lymphedema 11/24/2021 INITIAL  ?  ?  ?  ?PLAN: ?PT FREQUENCY/DURATION: 2x/week x 4 weeks prn ?  ?PLAN FOR NEXT SESSION: PROM, STM prn (pt starting radiation so no lotions),progress HEP and TE ?  ? ? ?Stark Bray, PT ?11/15/2021, 10:19 PM ? ?  ? ?

## 2021-11-16 ENCOUNTER — Ambulatory Visit: Payer: No Typology Code available for payment source

## 2021-11-16 ENCOUNTER — Ambulatory Visit
Admission: RE | Admit: 2021-11-16 | Discharge: 2021-11-16 | Disposition: A | Discharge status: Home / Self Care | Payer: No Typology Code available for payment source | Source: Ambulatory Visit | Attending: Radiation Oncology | Admitting: Radiation Oncology

## 2021-11-16 ENCOUNTER — Other Ambulatory Visit: Payer: Self-pay

## 2021-11-16 DIAGNOSIS — R600 Localized edema: Secondary | ICD-10-CM

## 2021-11-16 DIAGNOSIS — M25612 Stiffness of left shoulder, not elsewhere classified: Secondary | ICD-10-CM

## 2021-11-16 DIAGNOSIS — R293 Abnormal posture: Secondary | ICD-10-CM

## 2021-11-16 DIAGNOSIS — Z170 Estrogen receptor positive status [ER+]: Secondary | ICD-10-CM

## 2021-11-16 LAB — RAD ONC ARIA SESSION SUMMARY
Course Elapsed Days: 5
Plan Fractions Treated to Date: 4
Plan Prescribed Dose Per Fraction: 2.66 Gy
Plan Total Fractions Prescribed: 16
Plan Total Prescribed Dose: 42.56 Gy
Reference Point Dosage Given to Date: 10.64 Gy
Reference Point Session Dosage Given: 2.66 Gy
Session Number: 4

## 2021-11-16 NOTE — Therapy (Signed)
?OUTPATIENT PHYSICAL THERAPY TREATMENT NOTE ? ? ?Patient Name: Kelli Vaughn ?MRN: [id number] ?DOB:[birth year], 66 y.o., female ?Today's Date: 11/02/2021 ?PCP: [name], MD ?REFERRING PROVIDER: [name], MD ? ?END OF SESSION:  ? PT End of Session - 11/16/21 1053   ? ? Visit Number 7   ? Number of Visits 10   ? Date for PT Re-Evaluation 11/24/21   ? PT Start Time 1001   ? PT Stop Time 1041   ? PT Time Calculation (min) 40 min   ? Activity Tolerance Patient tolerated treatment well   ? Behavior During Therapy [facility] for tasks assessed/performed   ? ?  ?  ? ?  ? ? ? ? ? ?Past Medical History:  ?Diagnosis Date  ? Complication of anesthesia   ? Diabetes mellitus without complication ([place])   ? TYPE II  ? Glaucoma   ? Hypertension   ? PONV (postoperative nausea and vomiting)   ? ?Past Surgical History:  ?Procedure Laterality Date  ? BREAST LUMPECTOMY WITH RADIOACTIVE SEED AND SENTINEL LYMPH NODE BIOPSY Left 09/21/2021  ? Procedure: LEFT BREAST LUMPECTOMY WITH RADIOACTIVE SEED X2 AND SENTINEL LYMPH NODE BIOPSY;  Surgeon: [name], MD;  Location: [place];  Service: General;  Laterality: Left;  ? CESAREAN SECTION    ? X2  ? EYE SURGERY Bilateral 2019  ? cataract surgery  ? RADIOACTIVE SEED GUIDED AXILLARY SENTINEL LYMPH NODE Left 09/21/2021  ? Procedure: RADIOACTIVE SEED GUIDED AXILLARY SENTINEL LYMPH NODE BIOPSY;  Surgeon: [name], MD;  Location: [place];  Service: General;  Laterality: Left;  ? ?Patient Active Problem List  ? Diagnosis Date Noted  ? Genetic testing 09/17/2021  ? Family history of breast cancer 09/01/2021  ? Malignant neoplasm of upper-inner quadrant of left breast in female, estrogen receptor positive ([place]) 08/31/2021  ? ? ?REFERRING DIAG: left Breast Cancer ? ?THERAPY DIAG:  ?Malignant neoplasm of upper-inner quadrant of left breast in female, estrogen receptor positive ([place]) ? ?Abnormal posture ? ?Stiffness of left shoulder, not elsewhere classified ? ?Localized edema ? ?PERTINENT  HISTORY: Patient was diagnosed on 05/20/2021 with left grade II invasive ductal carcinoma breast cancer. It measures 3.7 cm and is located in the upper inner quadrant. It is ER/PR positive and HER 2 negative with a Ki67 of 5%. 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 Surgery for left lumpectomy with SLNB was performed on 09/21/2021 with 1/5 LN's. Pt had a post op seroma ? ?PRECAUTIONS: Recent Surgery, left UE Lymphedema risk, Other: DM,hypertension ? ?SUBJECTIVE:  I have had 4 radiation treatments so far.  I already went today at 8:45. I am feeling a little soreness under my arm when I move it or touch it.   ? ?PAIN:  ?Are you having pain? No, its a lot better. ? ? ?OBJECTIVE: (objective measures completed at initial evaluation unless otherwise dated) ? ?OBJECTIVE:  ?  ?PATIENT SURVEYS:  ?QUICK DASH: ?59% ?  ?OBSERVATIONS: ?           Steri strips still present over medial breast incision.  Firmness/swelling noted around incision. Axillary incision healed with mild firmness. Small cord noted in left axillary region that is very tender/hypersensitive for pt. Bruising/Mag trace? Noted lateral to nipple ?  ?POSTURE:  ?Forward head, rounded shoulders ?  ?LYMPHEDEMA ASSESSMENT:  ?  ?A/PROM RIGHT  09/01/2021 ?   ?Shoulder extension 47  ?Shoulder flexion 158  ?Shoulder abduction 154  ?Shoulder internal rotation 49  ?Shoulder external rotation  76  ?                        (Blank rows = not tested) ?  ?A/PROM LEFT  09/01/2021 10/27/2021 11/01/2021  ?Shoulder extension 46 55   ?Shoulder flexion 148 155 158  ?Shoulder abduction 147 140 154  ?Shoulder internal rotation 63 61   ?Shoulder external rotation 77 90 95  ?                        (Blank rows = not tested) ?  ?  ?CERVICAL AROM: ?All within normal limits ?  ?UPPER EXTREMITY STRENGTH: WFL ?  ?  ?LYMPHEDEMA ASSESSMENTS:  ?  ?[place] RIGHT  09/01/2021  ?10 cm proximal to olecranon process 32.2  ?Olecranon process 26.2  ?10 cm proximal to ulnar  styloid process 24.6  ?Just proximal to ulnar styloid process 17.6  ?Across hand at thumb web space 19.4  ?At base of 2nd digit 6.5  ?(Blank rows = not tested) ?  ?[place] LEFT  09/01/2021    ?10 cm proximal to olecranon process 34.5 34.8  ?Olecranon process 27.4 27.4  ?10 cm proximal to ulnar styloid process 23.5 23.2  ?Just proximal to ulnar styloid process 17.5 17.5  ?Across hand at thumb web space 19.1 19.1  ?At base of 2nd digit 6.3 6.1  ?(Blank rows = not tested) ?  ?              ?Surgery type/Date: left lumpectomy with SLNB, 09/21/2021 ?Number of lymph nodes removed: 5, 1 positive ?Current/past treatment (chemo, radiation, hormone therapy): pending radiation, hormone therapy ?Other symptoms:  ?Heaviness/tightness No ?Pain Yes ?Pitting edema No ?Infections No ?Decreased scar mobility Yes ?Stemmer sign No ? ?  ? ?Treatment Today; ?11/16/21 ?THERAPEUTIC EXERCISES ?Pulleys flexion, scaption and abd x 3 min ea. , ball rolls x 10 for flexion up the wall. Supine bilateral UE horizontal abd with yellow x10 ER with yellow x 10, and flexion with yellow x 10. ?MANUAL; MFR to cording in left axilla,  PROM left shoulder flexion, scaption, abduction, IR and ER  ?11/11/21 ?THERAPEUTIC EXERCISES ?Pulleys flexion, scaption and abd x 2 min ea. ball rolls x 10 for flexion . Supine bilateral UE horizontal abd with yellow x5, ER with yellow x 5, and flexion with yellow x 5. ?MANUAL; MFR to cording in left axilla,  PROM left shoulder flexion, scaption, abduction, IR and IR, Scar mobilization to axillary incision ? ?11/09/2021 ?THERAPEUTIC EXERCISES ?Pulleys flexion, scaption and abd x 2 min ea. Demonstration by PT first, ball rolls x 10 for flexion . Supine bilateral UE horizontal abd with yellow x5, ER with yellow x 5, and flexion with yellow x 5. Updated HEP ?MANUAL; MFR to cording in left axilla,  Soft tissue mobilization to left pectorals, lats and UT in supine with cocoa butter.PROM left shoulder flexion, scaption, abduction, IR  and IR, Scar mobilization to axillary incision ? ? ?11/04/2021 ?THERAPEUTIC EXERCISES ?Pulleys flexion, scaption and abd x 2 min ea. Demonstration by PT first, ball rolls x 10 for flexion and 5 for abduction, Supine bilateral AROM flexion, scaption, horizontal abduction ?MANUAL; MFR to cording in left axilla,  Soft tissue mobilization to left pectorals, lats and UT in supine with cocoa butter.PROM left shoulder flexion, scaption, abduction, IR and IR, Scar mobilization to axillary incision  ? ? ?PATIENT EDUCATION:  ?Education details: reviewed home program , supine scap series except sword ?Person educated:  Patient ?Education method: Explanation, Demonstration, and Handouts. Interpreted by son ?Education comprehension: returned demonstration ?  ?  ?HOME EXERCISE PROGRAM: ?           Reviewed previously given post op HEP. ?But had pt do flexion and star gazer in supine, supine scap stabs except sword ?  ?ASSESSMENT: ?  ?CLINICAL IMPRESSION: ?Pt has had 4 sessions for radiation up to this point. Pt is doing well and is moving Lt UE with ease with exercises.  Pt required minor tactile cues for alignment with band exercises today.  Pt has pain at mid to end range of Lt shoulder flexion and abduction that lessened with MFR to the axilla region and PT was able to progress P/ROM. Patient will benefit from skilled PT to address the below impairments and improve overall function.  ?  ?Pt will benefit from skilled therapeutic intervention to improve on the following deficits: Decreased knowledge of precautions, impaired UE functional use, pain, decreased ROM, postural dysfunction, swelling  ?  ?PT treatment/interventions: ADL/Self care home management, Therapeutic exercises, Therapeutic activity, Patient/Family education, Joint mobilization, Orthotic/Fit training, Manual lymph drainage, scar mobilization, and Manual therapy ?  ?  ?  ?  ?GOALS: ?Goals reviewed with patient? Yes ?  ?LONG TERM GOALS:  (STG=LTG) ?  ?GOALS Name  Target Date Goal status  ?1 Pt will demonstrate she has regained full shoulder ROM and function post operatively compared to baselines.  ?Baseline: 11/24/2021 INITIAL  ?2 Pt will report pain/burning improved by 5

## 2021-11-17 ENCOUNTER — Ambulatory Visit
Admission: RE | Admit: 2021-11-17 | Discharge: 2021-11-17 | Disposition: A | Discharge status: Home / Self Care | Payer: No Typology Code available for payment source | Source: Ambulatory Visit | Attending: Radiation Oncology | Admitting: Radiation Oncology

## 2021-11-17 ENCOUNTER — Other Ambulatory Visit: Payer: Self-pay

## 2021-11-17 LAB — RAD ONC ARIA SESSION SUMMARY
Course Elapsed Days: 6
Plan Fractions Treated to Date: 5
Plan Prescribed Dose Per Fraction: 2.66 Gy
Plan Total Fractions Prescribed: 16
Plan Total Prescribed Dose: 42.56 Gy
Reference Point Dosage Given to Date: 13.3 Gy
Reference Point Session Dosage Given: 2.66 Gy
Session Number: 5

## 2021-11-18 ENCOUNTER — Other Ambulatory Visit: Payer: Self-pay

## 2021-11-18 ENCOUNTER — Ambulatory Visit: Payer: Self-pay | Admitting: Rehabilitation

## 2021-11-18 ENCOUNTER — Ambulatory Visit
Admission: RE | Admit: 2021-11-18 | Discharge: 2021-11-18 | Disposition: A | Discharge status: Home / Self Care | Payer: No Typology Code available for payment source | Source: Ambulatory Visit | Attending: Radiation Oncology | Admitting: Radiation Oncology

## 2021-11-18 ENCOUNTER — Encounter: Payer: Self-pay | Admitting: Rehabilitation

## 2021-11-18 DIAGNOSIS — Z170 Estrogen receptor positive status [ER+]: Secondary | ICD-10-CM

## 2021-11-18 DIAGNOSIS — M25612 Stiffness of left shoulder, not elsewhere classified: Secondary | ICD-10-CM

## 2021-11-18 DIAGNOSIS — R600 Localized edema: Secondary | ICD-10-CM

## 2021-11-18 DIAGNOSIS — R293 Abnormal posture: Secondary | ICD-10-CM

## 2021-11-18 LAB — RAD ONC ARIA SESSION SUMMARY
Course Elapsed Days: 7
Plan Fractions Treated to Date: 6
Plan Prescribed Dose Per Fraction: 2.66 Gy
Plan Total Fractions Prescribed: 16
Plan Total Prescribed Dose: 42.56 Gy
Reference Point Dosage Given to Date: 15.96 Gy
Reference Point Session Dosage Given: 2.66 Gy
Session Number: 6

## 2021-11-18 NOTE — Therapy (Signed)
?OUTPATIENT PHYSICAL THERAPY TREATMENT NOTE ? ? ?Patient Name: Kelli Vaughn ?MRN: [id number] ?DOB:[birth year], 66 y.o., female ?Today's Date: 11/02/2021 ?PCP: [name], MD ?REFERRING PROVIDER: [name], MD ? ?END OF SESSION:  ? PT End of Session - 11/18/21 1053   ? ? Visit Number 8   ? Number of Visits 10   ? Date for PT Re-Evaluation 11/24/21   ? PT Start Time 1100   ? PT Stop Time 1147   ? PT Time Calculation (min) 47 min   ? Activity Tolerance Patient tolerated treatment well   ? Behavior During Therapy [facility] for tasks assessed/performed   ? ?  ?  ? ?  ? ? ? ? ? ?Past Medical History:  ?Diagnosis Date  ? Complication of anesthesia   ? Diabetes mellitus without complication ([place])   ? TYPE II  ? Glaucoma   ? Hypertension   ? PONV (postoperative nausea and vomiting)   ? ?Past Surgical History:  ?Procedure Laterality Date  ? BREAST LUMPECTOMY WITH RADIOACTIVE SEED AND SENTINEL LYMPH NODE BIOPSY Left 09/21/2021  ? Procedure: LEFT BREAST LUMPECTOMY WITH RADIOACTIVE SEED X2 AND SENTINEL LYMPH NODE BIOPSY;  Surgeon: [name], MD;  Location: [place];  Service: General;  Laterality: Left;  ? CESAREAN SECTION    ? X2  ? EYE SURGERY Bilateral 2019  ? cataract surgery  ? RADIOACTIVE SEED GUIDED AXILLARY SENTINEL LYMPH NODE Left 09/21/2021  ? Procedure: RADIOACTIVE SEED GUIDED AXILLARY SENTINEL LYMPH NODE BIOPSY;  Surgeon: [name], MD;  Location: [place];  Service: General;  Laterality: Left;  ? ?Patient Active Problem List  ? Diagnosis Date Noted  ? Genetic testing 09/17/2021  ? Family history of breast cancer 09/01/2021  ? Malignant neoplasm of upper-inner quadrant of left breast in female, estrogen receptor positive ([place]) 08/31/2021  ? ? ?REFERRING DIAG: left Breast Cancer ? ?THERAPY DIAG:  ?Malignant neoplasm of upper-inner quadrant of left breast in female, estrogen receptor positive ([place]) ? ?Abnormal posture ? ?Stiffness of left shoulder, not elsewhere classified ? ?Localized edema ? ?PERTINENT  HISTORY: Patient was diagnosed on 05/20/2021 with left grade II invasive ductal carcinoma breast cancer. It measures 3.7 cm and is located in the upper inner quadrant. It is ER/PR positive and HER 2 negative with a Ki67 of 5%. 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 Surgery for left lumpectomy with SLNB was performed on 09/21/2021 with 1/5 LN's. Pt had a post op seroma ? ?PRECAUTIONS: Recent Surgery, left UE Lymphedema risk, Other: DM,hypertension ? ?SUBJECTIVE:    My arm feels really good lately.  I already had radaition today  ? ?PAIN:  ?Are you having pain? No, its a lot better. ? ?OBJECTIVE:  ?  OBSERVATIONS: ?Steri strips still present over medial breast incision.  Firmness/swelling noted around incision. Axillary incision healed with mild firmness. Small cord noted in left axillary region that is very tender/hypersensitive for pt. Bruising/Mag trace? Noted lateral to nipple ?  ?LYMPHEDEMA ASSESSMENT:  ?  ?A/PROM RIGHT  09/01/2021 ?   ?Shoulder extension 47  ?Shoulder flexion 158  ?Shoulder abduction 154  ?Shoulder internal rotation 49  ?Shoulder external rotation 76  ?                        (Blank rows = not tested) ?  ?A/PROM LEFT  09/01/2021 10/27/2021 11/01/2021  ?Shoulder extension 46 55   ?Shoulder flexion 148 155 158  ?Shoulder abduction 147 140 154  ?  Shoulder internal rotation 63 61   ?Shoulder external rotation 77 90 95  ?                        (Blank rows = not tested) ?  ?  ?CERVICAL AROM: ?All within normal limits  ?UPPER EXTREMITY STRENGTH: WFL ?  LYMPHEDEMA ASSESSMENTS:  ?  ?[place] RIGHT  09/01/2021  ?10 cm proximal to olecranon process 32.2  ?Olecranon process 26.2  ?10 cm proximal to ulnar styloid process 24.6  ?Just proximal to ulnar styloid process 17.6  ?Across hand at thumb web space 19.4  ?At base of 2nd digit 6.5  ?(Blank rows = not tested) ?  ?[place] LEFT  09/01/2021    ?10 cm proximal to olecranon process 34.5 34.8  ?Olecranon process 27.4 27.4  ?10 cm  proximal to ulnar styloid process 23.5 23.2  ?Just proximal to ulnar styloid process 17.5 17.5  ?Across hand at thumb web space 19.1 19.1  ?At base of 2nd digit 6.3 6.1  ?(Blank rows = not tested) ?  ?              ?Surgery type/Date: left lumpectomy with SLNB, 09/21/2021 ?Number of lymph nodes removed: 5, 1 positive ?Current/past treatment (chemo, radiation, hormone therapy): pending radiation, hormone therapy ?Other symptoms:  ?Heaviness/tightness No ?Pain Yes ?Pitting edema No ?Infections No ?Decreased scar mobility Yes ?Stemmer sign No ? ?  ? ?Treatment Today; ?11/18/21 ?THERAPEUTIC EXERCISES: Pulleys flexion, scaption and abd x 2 min ea.  ball rolls x 10 for flexion up the wall. Standing row yellow x 15 ?MANUAL; MFR to cording in left axilla,  PROM left shoulder flexion, scaption, abduction, IR and ER, STM to UT, axillary borders, and along cording using cocoa butter ? ?11/16/21 ?THERAPEUTIC EXERCISES ?Pulleys flexion, scaption and abd x 3 min ea. , ball rolls x 10 for flexion up the wall. Supine bilateral UE horizontal abd with yellow x10 ER with yellow x 10, and flexion with yellow x 10. ?MANUAL; MFR to cording in left axilla,  PROM left shoulder flexion, scaption, abduction, IR and ER  ? ?11/11/21 ?THERAPEUTIC EXERCISES ?Pulleys flexion, scaption and abd x 2 min ea. ball rolls x 10 for flexion . Supine bilateral UE horizontal abd with yellow x5, ER with yellow x 5, and flexion with yellow x 5. ?MANUAL; MFR to cording in left axilla,  PROM left shoulder flexion, scaption, abduction, IR and IR, Scar mobilization to axillary incision ? ?PATIENT EDUCATION:  ?Education details: reviewed home program , supine scap series except sword ?Person educated: Patient ?Education method: Explanation, Demonstration, and Handouts. Interpreted by son ?Education comprehension: returned demonstration ?   ?HOME EXERCISE PROGRAM: ?           Reviewed previously given post op HEP. ?But had pt do flexion and star gazer in supine, supine  scap stabs except sword ?  ?ASSESSMENT: ?  ?CLINICAL IMPRESSION: ?Cord is not visible but is palpable near skin tag in axilla.  PROM/AROM almost full  ?  ?Pt will benefit from skilled therapeutic intervention to improve on the following deficits: Decreased knowledge of precautions, impaired UE functional use, pain, decreased ROM, postural dysfunction, swelling  ?  ?PT treatment/interventions: ADL/Self care home management, Therapeutic exercises, Therapeutic activity, Patient/Family education, Joint mobilization, Orthotic/Fit training, Manual lymph drainage, scar mobilization, and Manual therapy ?  ?  ?GOALS: ?Goals reviewed with patient? Yes ?  ?LONG TERM GOALS:  (STG=LTG) ?  ?GOALS Name Target Date Goal  status  ?1 Pt will demonstrate she has regained full shoulder ROM and function post operatively compared to baselines.  ?Baseline: 11/24/2021 INITIAL  ?2 Pt will report pain/burning improved by 50% or more 11/24/2021 Achieved ?11/02/2021  ?3 Quick dash will improve to no greater than 25% 11/24/2021 INITIAL  ?4 Pt will be educated in scar massage, and precautions for lymphedema 11/24/2021 INITIAL  ?  ?  ?  ?PLAN: ?PT FREQUENCY/DURATION: 2x/week x 4 weeks prn ?  ?PLAN FOR NEXT SESSION: PROM, STM prn (pt starting radiation so no lotions if having radiation later in the day),progress HEP and TE as able ?  ? ? ?Shan Levans, PT ? ?11/18/21 12:02 PM  ? ?[facility] ?[address], [address] ?[place], [place] [address] ?Phone # [phone] ?Fax [phone]  ? ?  ? ?

## 2021-11-19 ENCOUNTER — Ambulatory Visit
Admission: RE | Admit: 2021-11-19 | Discharge: 2021-11-19 | Disposition: A | Discharge status: Home / Self Care | Payer: No Typology Code available for payment source | Source: Ambulatory Visit | Attending: Radiation Oncology | Admitting: Radiation Oncology

## 2021-11-19 ENCOUNTER — Other Ambulatory Visit: Payer: Self-pay

## 2021-11-19 LAB — RAD ONC ARIA SESSION SUMMARY
Course Elapsed Days: 8
Plan Fractions Treated to Date: 7
Plan Prescribed Dose Per Fraction: 2.66 Gy
Plan Total Fractions Prescribed: 16
Plan Total Prescribed Dose: 42.56 Gy
Reference Point Dosage Given to Date: 18.62 Gy
Reference Point Session Dosage Given: 2.66 Gy
Session Number: 7

## 2021-11-22 ENCOUNTER — Other Ambulatory Visit: Payer: Self-pay

## 2021-11-22 ENCOUNTER — Ambulatory Visit
Admission: RE | Admit: 2021-11-22 | Discharge: 2021-11-22 | Disposition: A | Discharge status: Home / Self Care | Payer: No Typology Code available for payment source | Source: Ambulatory Visit | Attending: Radiation Oncology | Admitting: Radiation Oncology

## 2021-11-22 LAB — RAD ONC ARIA SESSION SUMMARY
Course Elapsed Days: 11
Plan Fractions Treated to Date: 8
Plan Prescribed Dose Per Fraction: 2.66 Gy
Plan Total Fractions Prescribed: 16
Plan Total Prescribed Dose: 42.56 Gy
Reference Point Dosage Given to Date: 21.28 Gy
Reference Point Session Dosage Given: 2.66 Gy
Session Number: 8

## 2021-11-23 ENCOUNTER — Other Ambulatory Visit: Payer: Self-pay

## 2021-11-23 ENCOUNTER — Ambulatory Visit
Admission: RE | Admit: 2021-11-23 | Discharge: 2021-11-23 | Disposition: A | Discharge status: Home / Self Care | Payer: No Typology Code available for payment source | Source: Ambulatory Visit | Attending: Radiation Oncology | Admitting: Radiation Oncology

## 2021-11-23 ENCOUNTER — Ambulatory Visit: Payer: No Typology Code available for payment source | Admitting: Rehabilitation

## 2021-11-23 ENCOUNTER — Encounter: Payer: Self-pay | Admitting: Rehabilitation

## 2021-11-23 DIAGNOSIS — R293 Abnormal posture: Secondary | ICD-10-CM

## 2021-11-23 DIAGNOSIS — C50212 Malignant neoplasm of upper-inner quadrant of left female breast: Secondary | ICD-10-CM

## 2021-11-23 DIAGNOSIS — M25612 Stiffness of left shoulder, not elsewhere classified: Secondary | ICD-10-CM

## 2021-11-23 DIAGNOSIS — R600 Localized edema: Secondary | ICD-10-CM

## 2021-11-23 LAB — RAD ONC ARIA SESSION SUMMARY
Course Elapsed Days: 12
Plan Fractions Treated to Date: 9
Plan Prescribed Dose Per Fraction: 2.66 Gy
Plan Total Fractions Prescribed: 16
Plan Total Prescribed Dose: 42.56 Gy
Reference Point Dosage Given to Date: 23.94 Gy
Reference Point Session Dosage Given: 2.66 Gy
Session Number: 9

## 2021-11-23 NOTE — Therapy (Signed)
?OUTPATIENT PHYSICAL THERAPY TREATMENT NOTE ? ? ?Patient Name: Kelli Vaughn ?MRN: [id number] ?DOB:August 07, 1955, 66 y.o., female ?Today's Date: 11/02/2021 ?PCP: [name], MD ?REFERRING PROVIDER: [name], MD ? ?END OF SESSION:  ? PT End of Session - 11/23/21 1052   ? ? Visit Number 9   ? Number of Visits 10   ? Date for PT Re-Evaluation 11/24/21   ? PT Start Time 1100   ? Activity Tolerance Patient tolerated treatment well   ? Behavior During Therapy [facility] for tasks assessed/performed   ? ?  ?  ? ?  ? ? ? ? ? ?Past Medical History:  ?Diagnosis Date  ? Complication of anesthesia   ? Diabetes mellitus without complication ([place])   ? TYPE II  ? Glaucoma   ? Hypertension   ? PONV (postoperative nausea and vomiting)   ? ?Past Surgical History:  ?Procedure Laterality Date  ? BREAST LUMPECTOMY WITH RADIOACTIVE SEED AND SENTINEL LYMPH NODE BIOPSY Left 09/21/2021  ? Procedure: LEFT BREAST LUMPECTOMY WITH RADIOACTIVE SEED X2 AND SENTINEL LYMPH NODE BIOPSY;  Surgeon: [name], MD;  Location: [place];  Service: General;  Laterality: Left;  ? CESAREAN SECTION    ? X2  ? EYE SURGERY Bilateral 2019  ? cataract surgery  ? RADIOACTIVE SEED GUIDED AXILLARY SENTINEL LYMPH NODE Left 09/21/2021  ? Procedure: RADIOACTIVE SEED GUIDED AXILLARY SENTINEL LYMPH NODE BIOPSY;  Surgeon: [name], MD;  Location: [place];  Service: General;  Laterality: Left;  ? ?Patient Active Problem List  ? Diagnosis Date Noted  ? Genetic testing 09/17/2021  ? Family history of breast cancer 09/01/2021  ? Malignant neoplasm of upper-inner quadrant of left breast in female, estrogen receptor positive ([place]) 08/31/2021  ? ? ?REFERRING DIAG: left Breast Cancer ? ?THERAPY DIAG:  ?Malignant neoplasm of upper-inner quadrant of left breast in female, estrogen receptor positive ([place]) ? ?Stiffness of left shoulder, not elsewhere classified ? ?Abnormal posture ? ?Localized edema ? ?PERTINENT HISTORY: Patient was diagnosed on 05/20/2021 with left grade  II invasive ductal carcinoma breast cancer. It measures 3.7 cm and is located in the upper inner quadrant. It is ER/PR positive and HER 2 negative with a Ki67 of 5%. 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 Surgery for left lumpectomy with SLNB was performed on 09/21/2021 with 1/5 LN's. Pt had a post op seroma ? ?PRECAUTIONS: Recent Surgery, left UE Lymphedema risk, Other: DM,hypertension ? ?SUBJECTIVE:  My arm is going very good.   I will be ready to be done after next time.   I already had radiation today  ? ?PAIN:  ?Are you having pain? No, its a lot better. ? ?OBJECTIVE:  ?  OBSERVATIONS: ?Steri strips still present over medial breast incision.  Firmness/swelling noted around incision. Axillary incision healed with mild firmness. Small cord noted in left axillary region that is very tender/hypersensitive for pt. Bruising/Mag trace? Noted lateral to nipple ?  ?LYMPHEDEMA ASSESSMENT:  ?  ?A/PROM RIGHT  09/01/2021 ?   ?Shoulder extension 47  ?Shoulder flexion 158  ?Shoulder abduction 154  ?Shoulder internal rotation 49  ?Shoulder external rotation 76  ?                        (Blank rows = not tested) ?  ?A/PROM LEFT  09/01/2021 10/27/2021 11/01/2021  ?Shoulder extension 46 55   ?Shoulder flexion 148 155 158  ?Shoulder abduction 147 140 154  ?Shoulder internal rotation 63  61   ?Shoulder external rotation 77 90 95  ?                        (Blank rows = not tested) ?  ?  ?CERVICAL AROM: ?All within normal limits  ?UPPER EXTREMITY STRENGTH: WFL ?  LYMPHEDEMA ASSESSMENTS:  ?  ?[place] RIGHT  09/01/2021  ?10 cm proximal to olecranon process 32.2  ?Olecranon process 26.2  ?10 cm proximal to ulnar styloid process 24.6  ?Just proximal to ulnar styloid process 17.6  ?Across hand at thumb web space 19.4  ?At base of 2nd digit 6.5  ?(Blank rows = not tested) ?  ?[place] LEFT  09/01/2021    ?10 cm proximal to olecranon process 34.5 34.8  ?Olecranon process 27.4 27.4  ?10 cm proximal to ulnar styloid  process 23.5 23.2  ?Just proximal to ulnar styloid process 17.5 17.5  ?Across hand at thumb web space 19.1 19.1  ?At base of 2nd digit 6.3 6.1  ?(Blank rows = not tested) ?  ?              ?Surgery type/Date: left lumpectomy with SLNB, 09/21/2021 ?Number of lymph nodes removed: 5, 1 positive ?Current/past treatment (chemo, radiation, hormone therapy): pending radiation, hormone therapy ?Other symptoms:  ?Heaviness/tightness No ?Pain Yes ?Pitting edema No ?Infections No ?Decreased scar mobility Yes ?Stemmer sign No ? ?  ? ?Treatment Today; ?11/23/21 ?THERAPEUTIC EXERCISES: Pulleys flexion, scaption and abd x 2 min ea.  ball rolls x 10 for flexion up the wall. Standing row yellow x 15 ?MANUAL; MFR to cording in left axilla,  PROM left shoulder flexion, scaption, abduction, IR and ER, STM to UT, axillary borders, and along cording using cocoa butter ? ?11/18/21 ?THERAPEUTIC EXERCISES: Pulleys flexion, scaption and abd x 2 min ea.  ball rolls x 10 for flexion up the wall. Standing row yellow x 15 ?MANUAL; MFR to cording in left axilla,  PROM left shoulder flexion, scaption, abduction, IR and ER, STM to UT, axillary borders, and along cording using cocoa butter ? ?11/16/21 ?THERAPEUTIC EXERCISES ?Pulleys flexion, scaption and abd x 3 min ea. , ball rolls x 10 for flexion up the wall. Supine bilateral UE horizontal abd with yellow x10 ER with yellow x 10, and flexion with yellow x 10. ?MANUAL; MFR to cording in left axilla,  PROM left shoulder flexion, scaption, abduction, IR and ER  ? ?PATIENT EDUCATION:  ?Education details: reviewed home program , supine scap series except sword ?Person educated: Patient ?Education method: Explanation, Demonstration, and Handouts. Interpreted by son ?Education comprehension: returned demonstration ?   ?HOME EXERCISE PROGRAM: ?           Reviewed previously given post op HEP. ?But had pt do flexion and star gazer in supine, supine scap stabs except sword ?  ?ASSESSMENT: ?  ?CLINICAL  IMPRESSION: ?Cord is not visible but is palpable near skin tag in axilla.  PROM/AROM full and pt is ready for DC after next visit.   ?  ?Pt will benefit from skilled therapeutic intervention to improve on the following deficits: Decreased knowledge of precautions, impaired UE functional use, pain, decreased ROM, postural dysfunction, swelling  ?  ?PT treatment/interventions: ADL/Self care home management, Therapeutic exercises, Therapeutic activity, Patient/Family education, Joint mobilization, Orthotic/Fit training, Manual lymph drainage, scar mobilization, and Manual therapy ?  ?  ?GOALS: ?Goals reviewed with patient? Yes ?  ?LONG TERM GOALS:  (STG=LTG) ?  ?GOALS Name Target Date Goal  status  ?1 Pt will demonstrate she has regained full shoulder ROM and function post operatively compared to baselines.  ?Baseline: 11/24/2021 INITIAL  ?2 Pt will report pain/burning improved by 50% or more 11/24/2021 Achieved ?11/02/2021  ?3 Quick dash will improve to no greater than 25% 11/24/2021 INITIAL  ?4 Pt will be educated in scar massage, and precautions for lymphedema 11/24/2021 INITIAL  ?  ?  ?  ?PLAN: ?PT FREQUENCY/DURATION: 2x/week x 4 weeks prn ?  ?PLAN FOR NEXT SESSION:  DC visit; needs SOZO scheduled out ?  ? ? ?Shan Levans, PT ? ?11/23/21 10:53 AM  ? ?[facility] ?[address], [address] ?[place], [place] [address] ?Phone # [phone] ?Fax [phone]  ? ?  ? ?

## 2021-11-24 ENCOUNTER — Other Ambulatory Visit: Payer: Self-pay

## 2021-11-24 ENCOUNTER — Ambulatory Visit
Admission: RE | Admit: 2021-11-24 | Discharge: 2021-11-24 | Disposition: A | Discharge status: Home / Self Care | Payer: No Typology Code available for payment source | Source: Ambulatory Visit | Attending: Radiation Oncology | Admitting: Radiation Oncology

## 2021-11-24 LAB — RAD ONC ARIA SESSION SUMMARY
Course Elapsed Days: 13
Plan Fractions Treated to Date: 10
Plan Prescribed Dose Per Fraction: 2.66 Gy
Plan Total Fractions Prescribed: 16
Plan Total Prescribed Dose: 42.56 Gy
Reference Point Dosage Given to Date: 26.6 Gy
Reference Point Session Dosage Given: 2.66 Gy
Session Number: 10

## 2021-11-25 ENCOUNTER — Ambulatory Visit
Admission: RE | Admit: 2021-11-25 | Discharge: 2021-11-25 | Disposition: A | Discharge status: Home / Self Care | Payer: No Typology Code available for payment source | Source: Ambulatory Visit | Attending: Radiation Oncology | Admitting: Radiation Oncology

## 2021-11-25 ENCOUNTER — Ambulatory Visit: Payer: Self-pay

## 2021-11-25 ENCOUNTER — Other Ambulatory Visit: Payer: Self-pay

## 2021-11-25 DIAGNOSIS — Z170 Estrogen receptor positive status [ER+]: Secondary | ICD-10-CM

## 2021-11-25 DIAGNOSIS — R600 Localized edema: Secondary | ICD-10-CM

## 2021-11-25 DIAGNOSIS — M25612 Stiffness of left shoulder, not elsewhere classified: Secondary | ICD-10-CM

## 2021-11-25 DIAGNOSIS — R293 Abnormal posture: Secondary | ICD-10-CM

## 2021-11-25 LAB — RAD ONC ARIA SESSION SUMMARY
Course Elapsed Days: 14
Plan Fractions Treated to Date: 11
Plan Prescribed Dose Per Fraction: 2.66 Gy
Plan Total Fractions Prescribed: 16
Plan Total Prescribed Dose: 42.56 Gy
Reference Point Dosage Given to Date: 29.26 Gy
Reference Point Session Dosage Given: 2.66 Gy
Session Number: 11

## 2021-11-25 NOTE — Therapy (Addendum)
OUTPATIENT PHYSICAL THERAPY TREATMENT NOTE   Patient Name: Kelli Vaughn MRN: [id number] DOB:26-Oct-1955, 66 y.o., female Today's Date: 11/02/2021 PCP: [name], MD REFERRING PROVIDER: [name] MD  END OF SESSION:   PT End of Session - 11/25/21 0958     Visit Number 10    Number of Visits 10    Date for PT Re-Evaluation 11/25/21    PT Start Time 0910    PT Stop Time 0954    PT Time Calculation (min) 44 min    Activity Tolerance Patient tolerated treatment well    Behavior During Therapy WFL for tasks assessed/performed                 Past Medical History:  Diagnosis Date   Complication of anesthesia    Diabetes mellitus without complication ([place])    TYPE II   Glaucoma    Hypertension    PONV (postoperative nausea and vomiting)    Past Surgical History:  Procedure Laterality Date   BREAST LUMPECTOMY WITH RADIOACTIVE SEED AND SENTINEL LYMPH NODE BIOPSY Left 09/21/2021   Procedure: LEFT BREAST LUMPECTOMY WITH RADIOACTIVE SEED X2 AND SENTINEL LYMPH NODE BIOPSY;  Surgeon: [name], MD;  Location: [facility];  Service: General;  Laterality: Left;   CESAREAN SECTION     X2   EYE SURGERY Bilateral 2019   cataract surgery   RADIOACTIVE SEED GUIDED AXILLARY SENTINEL LYMPH NODE Left 09/21/2021   Procedure: RADIOACTIVE SEED GUIDED AXILLARY SENTINEL LYMPH NODE BIOPSY;  Surgeon: [name], MD;  Location: [facility];  Service: General;  Laterality: Left;   Patient Active Problem List   Diagnosis Date Noted   Genetic testing 09/17/2021   Family history of breast cancer 09/01/2021   Malignant neoplasm of upper-inner quadrant of left breast in female, estrogen receptor positive ([place]) 08/31/2021    REFERRING DIAG: left Breast Cancer  THERAPY DIAG:  Malignant neoplasm of upper-inner quadrant of left breast in female, estrogen receptor positive ([place])  Stiffness of left shoulder, not elsewhere classified  Abnormal posture  Localized edema  PERTINENT  HISTORY: Patient was diagnosed on 05/20/2021 with left grade II invasive ductal carcinoma breast cancer. It measures 3.7 cm and is located in the upper inner quadrant. It is ER/PR positive and HER 2 negative with a Ki67 of 5%. 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 Surgery for left lumpectomy with SLNB was performed on 09/21/2021 with 1/5 LN's. Pt had a post op seroma  PRECAUTIONS: Recent Surgery, left UE Lymphedema risk, Other: DM,hypertension  SUBJECTIVE:  My arm is going very good.   I will be ready to be finished.   I have occasional random pain at the axillary region.I can do my home chores like sweeping, mopping, cooking, wash clothes etc. She has not been gardening or tending to her plants and using a hoe or tool. Radiation is going well. Overall pain is much better.(All treatment today done through interpreter)  PAIN:  Are you having pain? No, its a lot better.  OBJECTIVE:    OBSERVATIONS: Steri strips still present over medial breast incision.  Firmness/swelling noted around incision. Axillary incision healed with mild firmness. Small cord noted in left axillary region that is very tender/hypersensitive for pt. Bruising/Mag trace? Noted lateral to nipple   LYMPHEDEMA ASSESSMENT:    A/PROM RIGHT  09/01/2021    Shoulder extension 47  Shoulder flexion 158  Shoulder abduction 154  Shoulder internal rotation 49  Shoulder external rotation 76                          (  Blank rows = not tested)   A/PROM LEFT  09/01/2021 10/27/2021 11/01/2021 11/25/2021  Shoulder extension 46 55  60  Shoulder flexion 148 155 158 162  Shoulder abduction 147 140 154 168  Shoulder internal rotation 63 61    Shoulder external rotation 77 90 95 100                          (Blank rows = not tested)     CERVICAL AROM: All within normal limits  UPPER EXTREMITY STRENGTH: WFL   LYMPHEDEMA ASSESSMENTS:    LANDMARK RIGHT  09/01/2021  10 cm proximal to olecranon process 32.2   Olecranon process 26.2  10 cm proximal to ulnar styloid process 24.6  Just proximal to ulnar styloid process 17.6  Across hand at thumb web space 19.4  At base of 2nd digit 6.5  (Blank rows = not tested)   [facility] LEFT  09/01/2021   11/25/2021  10 cm proximal to olecranon process 34.5 34.8   Olecranon process 27.4 27.4 [id number]  10 cm proximal to ulnar styloid process 23.5 23.2 23.2  Just proximal to ulnar styloid process 17.5 17.5 17.6  Across hand at thumb web space 19.1 19.1 19.0  At base of 2nd digit 6.3 6.1 6.0  (Blank rows = not tested)                 Surgery type/Date: left lumpectomy with SLNB, 09/21/2021 Number of lymph nodes removed: 5, 1 positive Current/past treatment (chemo, radiation, hormone therapy): pending radiation, hormone therapy Other symptoms:  Heaviness/tightness No Pain Yes Pitting edema No Infections No Decreased scar mobility Yes Stemmer sign No     Treatment Today;  11/25/2021 Pulleys x 1;30 sec flexion and abduction to warm up before measuring ROM Discussed skin care, warning signs of infection and precautions for lymphedema and return to activities. Remeasured Shoulder AROM and circumference measures. Reminded pt about scar massage and advised pt to always watch for swelling in breast/arm especially as she goes thorough radiation. Discussed putting foam pad in bra over area of fibrosis when her skin is not so sensitive    11/23/21 THERAPEUTIC EXERCISES: Pulleys flexion, scaption and abd x 2 min ea.  ball rolls x 10 for flexion up the wall. Standing row yellow x 15 MANUAL; MFR to cording in left axilla,  PROM left shoulder flexion, scaption, abduction, IR and ER, STM to UT, axillary borders, and along cording using cocoa butter  11/18/21 THERAPEUTIC EXERCISES: Pulleys flexion, scaption and abd x 2 min ea.  ball rolls x 10 for flexion up the wall. Standing row yellow x 15 MANUAL; MFR to cording in left axilla,  PROM left shoulder flexion,  scaption, abduction, IR and ER, STM to UT, axillary borders, and along cording using cocoa butter  11/16/21 THERAPEUTIC EXERCISES Pulleys flexion, scaption and abd x 3 min ea. , ball rolls x 10 for flexion up the wall. Supine bilateral UE horizontal abd with yellow x10 ER with yellow x 10, and flexion with yellow x 10. MANUAL; MFR to cording in left axilla,  PROM left shoulder flexion, scaption, abduction, IR and ER   PATIENT EDUCATION:  Education details: reviewed home program , supine scap series except sword Person educated: Patient Education method: Explanation, Demonstration, and Handouts. Interpreted by son Education comprehension: returned demonstration    HOME EXERCISE PROGRAM:            Reviewed previously given post op HEP. But had  pt do flexion and star gazer in supine, supine scap stabs except sword   ASSESSMENT:   CLINICAL IMPRESSION: Pt has achieved all goals established at initial evaluation.  She has made excellent progress She does have a small fibrotic area in the left breast for which she was advised to wear her compression bra and foam pad when her skin is not so sensitive. She has resumed most normal home chores and has very little pain. She is discharged  Pt will benefit from skilled therapeutic intervention to improve on the following deficits: Decreased knowledge of precautions, impaired UE functional use, pain, decreased ROM, postural dysfunction, swelling    PT treatment/interventions: ADL/Self care home management, Therapeutic exercises, Therapeutic activity, Patient/Family education, Joint mobilization, Orthotic/Fit training, Manual lymph drainage, scar mobilization, and Manual therapy     GOALS: Goals reviewed with patient? Yes   LONG TERM GOALS:  (STG=LTG)   GOALS Name Target Date Goal status  1 Pt will demonstrate she has regained full shoulder ROM and function post operatively compared to baselines.  Baseline: 11/25/2021 Achieved  2 Pt will report  pain/burning improved by 50% or more 11/24/2021 Achieved 11/02/2021  3 Quick dash will improve to no greater than 25% 11/24/2021 ACHIEVED 11%  4 Pt will be educated in scar massage, and precautions for lymphedema 11/24/2021 Achieved        PLAN: PT FREQUENCY/DURATION: 2x/week x 4 weeks prn   PLAN FOR NEXT SESSION:  Pt is discharged from formal PT [facility] was scheduled out PHYSICAL THERAPY DISCHARGE SUMMARY  Visits from Start of Care: 10  Current functional level related to goals / functional outcomes: Achieved all goals   Remaining deficits: none   Education / Equipment: HEP instruction   Patient agrees to discharge. Patient goals were met. Patient is being discharged due to meeting the stated rehab goals.     Cheral Almas, PT  11/25/21 9:59 AM   [facility] [address], [place], [place] [address] Phone # [phone] Fax [phone]

## 2021-11-26 ENCOUNTER — Ambulatory Visit
Admission: RE | Admit: 2021-11-26 | Discharge: 2021-11-26 | Disposition: A | Discharge status: Home / Self Care | Payer: No Typology Code available for payment source | Source: Ambulatory Visit | Attending: Radiation Oncology | Admitting: Radiation Oncology

## 2021-11-26 ENCOUNTER — Other Ambulatory Visit: Payer: Self-pay

## 2021-11-26 LAB — RAD ONC ARIA SESSION SUMMARY
Course Elapsed Days: 15
Plan Fractions Treated to Date: 12
Plan Prescribed Dose Per Fraction: 2.66 Gy
Plan Total Fractions Prescribed: 16
Plan Total Prescribed Dose: 42.56 Gy
Reference Point Dosage Given to Date: 31.92 Gy
Reference Point Session Dosage Given: 2.66 Gy
Session Number: 12

## 2021-11-29 ENCOUNTER — Other Ambulatory Visit: Payer: Self-pay

## 2021-11-29 ENCOUNTER — Ambulatory Visit
Admission: RE | Admit: 2021-11-29 | Discharge: 2021-11-29 | Disposition: A | Discharge status: Home / Self Care | Payer: No Typology Code available for payment source | Source: Ambulatory Visit | Attending: Radiation Oncology | Admitting: Radiation Oncology

## 2021-11-29 LAB — RAD ONC ARIA SESSION SUMMARY
Course Elapsed Days: 18
Plan Fractions Treated to Date: 13
Plan Prescribed Dose Per Fraction: 2.66 Gy
Plan Total Fractions Prescribed: 16
Plan Total Prescribed Dose: 42.56 Gy
Reference Point Dosage Given to Date: 34.58 Gy
Reference Point Session Dosage Given: 2.66 Gy
Session Number: 13

## 2021-11-30 ENCOUNTER — Ambulatory Visit
Admission: RE | Admit: 2021-11-30 | Discharge: 2021-11-30 | Disposition: A | Discharge status: Home / Self Care | Payer: No Typology Code available for payment source | Source: Ambulatory Visit | Attending: Radiation Oncology | Admitting: Radiation Oncology

## 2021-11-30 ENCOUNTER — Other Ambulatory Visit: Payer: Self-pay

## 2021-11-30 LAB — RAD ONC ARIA SESSION SUMMARY
Course Elapsed Days: 19
Plan Fractions Treated to Date: 14
Plan Prescribed Dose Per Fraction: 2.66 Gy
Plan Total Fractions Prescribed: 16
Plan Total Prescribed Dose: 42.56 Gy
Reference Point Dosage Given to Date: 37.24 Gy
Reference Point Session Dosage Given: 2.66 Gy
Session Number: 14

## 2021-12-01 ENCOUNTER — Ambulatory Visit
Admission: RE | Admit: 2021-12-01 | Discharge: 2021-12-01 | Disposition: A | Discharge status: Home / Self Care | Payer: No Typology Code available for payment source | Source: Ambulatory Visit | Attending: Radiation Oncology | Admitting: Radiation Oncology

## 2021-12-01 ENCOUNTER — Other Ambulatory Visit: Payer: Self-pay

## 2021-12-01 LAB — RAD ONC ARIA SESSION SUMMARY
Course Elapsed Days: 20
Plan Fractions Treated to Date: 15
Plan Prescribed Dose Per Fraction: 2.66 Gy
Plan Total Fractions Prescribed: 16
Plan Total Prescribed Dose: 42.56 Gy
Reference Point Dosage Given to Date: 39.9 Gy
Reference Point Session Dosage Given: 2.66 Gy
Session Number: 15

## 2021-12-02 ENCOUNTER — Other Ambulatory Visit: Payer: Self-pay

## 2021-12-02 ENCOUNTER — Ambulatory Visit
Admission: RE | Admit: 2021-12-02 | Discharge: 2021-12-02 | Disposition: A | Discharge status: Home / Self Care | Payer: No Typology Code available for payment source | Source: Ambulatory Visit | Attending: Radiation Oncology | Admitting: Radiation Oncology

## 2021-12-02 LAB — RAD ONC ARIA SESSION SUMMARY
Course Elapsed Days: 21
Plan Fractions Treated to Date: 16
Plan Prescribed Dose Per Fraction: 2.66 Gy
Plan Total Fractions Prescribed: 16
Plan Total Prescribed Dose: 42.56 Gy
Reference Point Dosage Given to Date: 42.56 Gy
Reference Point Session Dosage Given: 2.66 Gy
Session Number: 16

## 2021-12-03 ENCOUNTER — Ambulatory Visit
Admission: RE | Admit: 2021-12-03 | Discharge: 2021-12-03 | Disposition: A | Discharge status: Home / Self Care | Payer: No Typology Code available for payment source | Source: Ambulatory Visit | Attending: Radiation Oncology | Admitting: Radiation Oncology

## 2021-12-03 ENCOUNTER — Other Ambulatory Visit: Payer: Self-pay

## 2021-12-03 LAB — RAD ONC ARIA SESSION SUMMARY
Course Elapsed Days: 22
Plan Fractions Treated to Date: 1
Plan Prescribed Dose Per Fraction: 2 Gy
Plan Total Fractions Prescribed: 4
Plan Total Prescribed Dose: 8 Gy
Reference Point Dosage Given to Date: 44.56 Gy
Reference Point Session Dosage Given: 2 Gy
Session Number: 17

## 2021-12-07 ENCOUNTER — Other Ambulatory Visit: Payer: Self-pay

## 2021-12-07 ENCOUNTER — Ambulatory Visit
Admission: RE | Admit: 2021-12-07 | Discharge: 2021-12-07 | Disposition: A | Discharge status: Home / Self Care | Payer: No Typology Code available for payment source | Source: Ambulatory Visit | Attending: Radiation Oncology | Admitting: Radiation Oncology

## 2021-12-07 LAB — RAD ONC ARIA SESSION SUMMARY
Course Elapsed Days: 26
Plan Fractions Treated to Date: 2
Plan Prescribed Dose Per Fraction: 2 Gy
Plan Total Fractions Prescribed: 4
Plan Total Prescribed Dose: 8 Gy
Reference Point Dosage Given to Date: 46.56 Gy
Reference Point Session Dosage Given: 2 Gy
Session Number: 18

## 2021-12-07 NOTE — Progress Notes (Signed)
                                                                                                                                                            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 She developed fatigue and anticipated skin changes in the treatment field.   Plan: The patient will receive a call in about one month from the radiation oncology department. She will continue follow up with Dr. IChryl Heckas well.   ________________________________________________    ACarola Rhine [facility]

## 2021-12-08 ENCOUNTER — Other Ambulatory Visit: Payer: Self-pay

## 2021-12-08 ENCOUNTER — Ambulatory Visit
Admission: RE | Admit: 2021-12-08 | Discharge: 2021-12-08 | Disposition: A | Discharge status: Home / Self Care | Payer: No Typology Code available for payment source | Source: Ambulatory Visit | Attending: Radiation Oncology | Admitting: Radiation Oncology

## 2021-12-08 ENCOUNTER — Encounter: Payer: Self-pay | Admitting: *Deleted

## 2021-12-08 DIAGNOSIS — Z170 Estrogen receptor positive status [ER+]: Secondary | ICD-10-CM

## 2021-12-08 LAB — RAD ONC ARIA SESSION SUMMARY
Course Elapsed Days: 27
Plan Fractions Treated to Date: 3
Plan Prescribed Dose Per Fraction: 2 Gy
Plan Total Fractions Prescribed: 4
Plan Total Prescribed Dose: 8 Gy
Reference Point Dosage Given to Date: 48.56 Gy
Reference Point Session Dosage Given: 2 Gy
Session Number: 19

## 2021-12-09 ENCOUNTER — Other Ambulatory Visit: Payer: Self-pay

## 2021-12-09 ENCOUNTER — Encounter: Payer: Self-pay | Admitting: Radiation Oncology

## 2021-12-09 ENCOUNTER — Ambulatory Visit
Admission: RE | Admit: 2021-12-09 | Discharge: 2021-12-09 | Disposition: A | Discharge status: Home / Self Care | Payer: No Typology Code available for payment source | Source: Ambulatory Visit | Attending: Radiation Oncology | Admitting: Radiation Oncology

## 2021-12-09 DIAGNOSIS — Z170 Estrogen receptor positive status [ER+]: Secondary | ICD-10-CM | POA: Insufficient documentation

## 2021-12-09 DIAGNOSIS — C50212 Malignant neoplasm of upper-inner quadrant of left female breast: Secondary | ICD-10-CM | POA: Insufficient documentation

## 2021-12-09 LAB — RAD ONC ARIA SESSION SUMMARY
Course Elapsed Days: 28
Plan Fractions Treated to Date: 4
Plan Prescribed Dose Per Fraction: 2 Gy
Plan Total Fractions Prescribed: 4
Plan Total Prescribed Dose: 8 Gy
Reference Point Dosage Given to Date: 50.56 Gy
Reference Point Session Dosage Given: 2 Gy
Session Number: 20

## 2021-12-10 ENCOUNTER — Inpatient Hospital Stay
Payer: No Typology Code available for payment source | Attending: Hematology and Oncology | Admitting: Hematology and Oncology

## 2021-12-10 ENCOUNTER — Encounter: Payer: Self-pay | Admitting: Hematology and Oncology

## 2021-12-10 VITALS — BP 152/71 | HR 79 | Temp 97.9°F | Resp 16 | Ht 59.0 in | Wt 196.9 lb

## 2021-12-10 DIAGNOSIS — Z79899 Other long term (current) drug therapy: Secondary | ICD-10-CM | POA: Insufficient documentation

## 2021-12-10 DIAGNOSIS — Z923 Personal history of irradiation: Secondary | ICD-10-CM | POA: Insufficient documentation

## 2021-12-10 DIAGNOSIS — C50212 Malignant neoplasm of upper-inner quadrant of left female breast: Secondary | ICD-10-CM | POA: Insufficient documentation

## 2021-12-10 DIAGNOSIS — E119 Type 2 diabetes mellitus without complications: Secondary | ICD-10-CM | POA: Insufficient documentation

## 2021-12-10 DIAGNOSIS — Z800 Family history of malignant neoplasm of digestive organs: Secondary | ICD-10-CM | POA: Insufficient documentation

## 2021-12-10 DIAGNOSIS — Z87891 Personal history of nicotine dependence: Secondary | ICD-10-CM | POA: Insufficient documentation

## 2021-12-10 DIAGNOSIS — C773 Secondary and unspecified malignant neoplasm of axilla and upper limb lymph nodes: Secondary | ICD-10-CM | POA: Insufficient documentation

## 2021-12-10 DIAGNOSIS — I10 Essential (primary) hypertension: Secondary | ICD-10-CM | POA: Insufficient documentation

## 2021-12-10 DIAGNOSIS — Z170 Estrogen receptor positive status [ER+]: Secondary | ICD-10-CM | POA: Insufficient documentation

## 2021-12-10 DIAGNOSIS — Z803 Family history of malignant neoplasm of breast: Secondary | ICD-10-CM | POA: Insufficient documentation

## 2021-12-10 MED ORDER — ANASTROZOLE 1 MG PO TABS: 1.0000 mg | ORAL_TABLET | Freq: Every day | ORAL | 3 refills | Status: DC

## 2021-12-10 NOTE — Progress Notes (Signed)
[facility] NOTE  Patient Care Team: [name], MD as PCP - General (Family Medicine) [name], MD as Consulting Physician (General Surgery) Benay Pike, MD as Consulting Physician (Hematology and Oncology) Kyung Rudd, MD as Consulting Physician (Radiation Oncology) Rockwell Germany, RN as Oncology Nurse Navigator Mauro Kaufmann, RN as Oncology Nurse Navigator  CHIEF COMPLAINTS/PURPOSE OF CONSULTATION:   HISTORY OF PRESENTING ILLNESS:   Kelli Vaughn 66 y.o. female is here because of recent diagnosis of left IDC. I reviewed her records extensively and collaborated the history with the patient.  SUMMARY OF ONCOLOGIC HISTORY: Oncology History Overview Note  Patient was called back for evaluation of LEFT breast calcifications, focal asymmetry, and possible enlarged LEFT axillary lymph node from screening mammogram at [facility] on 05/20/2021. 08/12/2021, Suspicious mass in the 9:30 o'clock location of the LEFT breast warranting tissue diagnosis. Two LEFT axillary lymph nodes with abnormal morphology. 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 Lymph node biopsy of left axilla negative, lymph node biopsy results were thought to be discordant.   Patient does recollect being asked to schedule biopsy back in 2021 for abnormal mammogram but she was worried or had this misinformation that biopsy can let the breast cancer spread to other parts hence she decided not to proceed with biopsy at that time. She is here with her 2 sons, speaks in [place], [place] interpreter present at the time of my visit.  Son also help with the translation.   Malignant neoplasm of upper-inner quadrant of left breast in female, estrogen receptor positive ([place])  08/31/2021 Initial Diagnosis   Malignant neoplasm of upper-inner quadrant of left breast in  female, estrogen receptor positive ([place])   09/01/2021 Cancer Staging   Staging form: Breast, AJCC 8th Edition - Clinical stage from 09/01/2021: Stage Unknown (cT1b, cNX, cM0, G2, ER+, PR+, HER2-) - Signed by Benay Pike, MD on 09/01/2021 Stage prefix: Initial diagnosis Histologic grading system: 3 grade system     Genetic Testing   [facility] CancerNext Panel was Negative. 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 Report date is 09/20/2021.  The CancerNext gene panel offered by [employer] includes sequencing, rearrangement analysis, and RNA analysis for the following 36 genes:   APC, ATM, AXIN2, BARD1, BMPR1A, BRCA1, BRCA2, BRIP1, CDH1, CDK4, CDKN2A, CHEK2, DICER1, HOXB13, EPCAM, GREM1, MLH1, MSH2, MSH3, MSH6, MUTYH, NBN, NF1, NTHL1, PALB2, PMS2, POLD1, POLE, PTEN, RAD51C, RAD51D, RECQL, SMAD4, SMARCA4, STK11, and TP53.    09/21/2021 Surgery   Patient had left breast lumpectomy with a sentinel lymph node excision on 09/21/2021.  Final pathology showed invasive carcinoma of no special type, 13 mm in greatest dimension, grade 2, DCIS grade 2, all margins negative for invasive carcinoma and DCIS, distance from invasive carcinoma to closest margin is 0.5 mm, posterior margin and distance from DCIS to closest margin, 1 mm posterior margin.  5 axillary lymph nodes removed.  1 axillary lymph node with micrometastasis, metastatic deposit of 0.3 mm in greatest dimension.   Prognostics ER +80% moderate weak staining intensity, PR +100% strong staining intensity and HER2 negative    Oncotype testing   Oncotype DX score of 11.  Distant recurrence risk at 9 years of 13% with antiestrogen therapy.  No benefit from chemotherapy    Interval history  Patient is doing very well today.  She completed adjuvant radiation and has done really well overall. She has noticed some darkening of the skin since radiation.  She also had a few questions regarding some itchy skin  patches that have been going on for years as well as diet recommendations, role of multivitamins. Rest of the pertinent 10 point ROS reviewed and negative  MEDICAL HISTORY:  Past Medical History:  Diagnosis Date   Complication of anesthesia    Diabetes mellitus without complication ([facility])    TYPE II   Glaucoma    Hypertension    PONV (postoperative nausea and vomiting)     SURGICAL HISTORY: Past Surgical History:  Procedure Laterality Date   BREAST LUMPECTOMY WITH RADIOACTIVE SEED AND SENTINEL LYMPH NODE BIOPSY Left 09/21/2021   Procedure: LEFT BREAST LUMPECTOMY WITH RADIOACTIVE SEED X2 AND SENTINEL LYMPH NODE BIOPSY;  Surgeon: [name], MD;  Location: [facility] [place];  Service: General;  Laterality: Left;   CESAREAN SECTION     X2   EYE SURGERY Bilateral 2019   cataract surgery   RADIOACTIVE SEED GUIDED AXILLARY SENTINEL LYMPH NODE Left 09/21/2021   Procedure: RADIOACTIVE SEED GUIDED AXILLARY SENTINEL LYMPH NODE BIOPSY;  Surgeon: [name], MD;  Location: [facility];  Service: General;  Laterality: Left;    SOCIAL HISTORY: Social History   Socioeconomic History   Marital status: Divorced    Spouse name: Not on file   Number of children: 2   Years of education: Not on file   Highest education level: Some college, no degree  Occupational History   Not on file  Tobacco Use   Smoking status: Former    Types: Cigarettes   Smokeless tobacco: Never  Vaping Use   Vaping Use: Never used  Substance and Sexual Activity   Alcohol use: No   Drug use: No   Sexual activity: Not Currently  Other Topics Concern   Not on file  Social History Narrative   Not on file   Social Determinants of Health   Financial Resource Strain: Not on file  Food Insecurity: No Food Insecurity   Worried About [place] in the Last Year: Never true   [place] in the Last Year: Never true  Transportation Needs: No Transportation Needs   Lack of Transportation (Medical): No   Lack of  Transportation (Non-Medical): No  Physical Activity: Not on file  Stress: Not on file  Social Connections: Not on file  Intimate Partner Violence: Not on file    FAMILY HISTORY: Family History  Problem Relation Age of Onset   Heart disease Mother    Bone cancer Father    Breast cancer Sister        dx. 53s   Stomach cancer Sister        dx. 15s, unsure if second primary or metastasis   Breast cancer Niece     ALLERGIES:  has No Known Allergies.  MEDICATIONS:  Current Outpatient Medications  Medication Sig Dispense Refill   anastrozole (ARIMIDEX) 1 MG tablet Take 1 tablet (1 mg total) by mouth daily. 90 tablet 3   aspirin EC 81 MG tablet Take 81 mg by mouth at bedtime.     gabapentin (NEURONTIN) 300 MG capsule Take 300 mg by mouth daily at 12 noon.     glipiZIDE (GLUCOTROL) 10 MG tablet Take 5 mg by mouth in the morning and at bedtime.     hydrochlorothiazide (HYDRODIURIL) 25 MG tablet Take 25 mg by mouth in the morning.  ibuprofen (ADVIL,MOTRIN) 800 MG tablet Take 1,600 mg by mouth every 8 (eight) hours as needed for moderate pain.     insulin glargine (LANTUS) 100 UNIT/ML injection Inject 35 Units into the skin 2 (two) times daily.     lisinopril (ZESTRIL) 10 MG tablet Take 10 mg by mouth in the morning.     LUMIGAN 0.01 % SOLN Place 1 drop into both eyes at bedtime.   3   oxyCODONE (OXY IR/ROXICODONE) 5 MG immediate release tablet Take 1 tablet (5 mg total) by mouth every 6 (six) hours as needed for severe pain. 10 tablet 0   SitaGLIPtin-MetFORMIN HCl (JANUMET XR) 50-1000 MG TB24 Take 1 tablet by mouth in the morning and at bedtime.     No current facility-administered medications for this visit.     PHYSICAL EXAMINATION: ECOG PERFORMANCE STATUS: 0 - Asymptomatic  Vitals:   12/10/21 0923  BP: (!) 152/71  Pulse: 79  Resp: 16  Temp: 97.9 F (36.6 C)  SpO2: 99%    Filed Weights   12/10/21 0923  Weight: 196 lb 14.4 oz (89.3 kg)   General appearance: Alert  oriented and in no acute distress The skin exam, macular hyperpigmented patches associated with some dryness of the skin, noted on the back as well as on the right lower extremity.  They appear well-defined, no irregularity, bleeding or variegated appearance noted. LABORATORY DATA:  I have reviewed the data as listed Lab Results  Component Value Date   WBC 11.8 (H) 09/15/2021   HGB 13.6 09/15/2021   HCT 41.6 09/15/2021   MCV 91.4 09/15/2021   PLT 324 09/15/2021   Lab Results  Component Value Date   NA 136 09/15/2021   K 4.4 09/15/2021   CL 100 09/15/2021   CO2 28 09/15/2021    RADIOGRAPHIC STUDIES: I have personally reviewed the radiological reports and agreed with the findings in the report.  ASSESSMENT AND PLAN:  Malignant neoplasm of upper-inner quadrant of left breast in female, estrogen receptor positive ([place]) This is a very pleasant 66 year old female patient with past medical history significant for type 2 diabetes mellitus, hypertension who had an abnormal mammogram back in 2021 with 6 mm breast mass in the left breast at 930 o'clock as well as left abnormal axillary lymph nodes, did not undergo biopsy at that time as recommended who presented with abnormal mammogram again which led to further testing.  Left breast needle core biopsy at this time showed invasive ductal carcinoma, grade 2 along with DCIS, ER +80% strong intensity, PR +100% strong intensity, KI 5% and HER2 negative.  Lymph node biopsy was negative which was thought to be discordant.    She was presented in the breast [place] and recommendation was to proceed with surgery upfront followed by adjuvant recommendations based on final pathology.  She is here to review her pathology.  We only have a preliminary report so far.  This showed left invasive ductal carcinoma, 13 mm in greatest dimension, Nottingham grade 2, DCIS grade 2, all margins negative, 1 out of 5 axillary lymph nodes with micrometastasis, tumor measuring 1.3  mm, prognostics from lymph node are pending.  I called pathology about not having the prognostic reports from the lymph node biopsy since this was mentioned in the original report.  They are looking at the status of this at this time.  Oncotype DX showed a score of 11, there appears to be no benefit from chemotherapy.  Distant recurrence risk at 9 years of  13% with antiestrogen alone.  She is now s/p adjuvant radiation.  She is here to discuss about anti estrogen therapy. We have once again discussed today about mechanism of action of Tamoxifen as well as aromatase inhibitors. We have discussed adverse effects of Tamoxifen including but not limited to post menopausal symptoms, increased risk of DVT/PE, increased risk of endometrial cancer. A benefit would be improvement in bone density. We have then discussed about adverse effects of aromatase inhibitors including but not limited to post menopausal symptoms such as hot flashes, vaginal dryness, bone loss, arthralgias.  She would like to proceed with anastrazole. Baseline bone density ordered as well. RTC in 3 months.  Regarding skin rash, this is not concerning for malignancy, but if they continue to change colors, bleed or increase in number, she needs to consult dermatology. For itching, she can use 1% hydrocortisone.  I spent 40 minutes in the care of this patient including review role of antiestrogen therapy,, mechanism of action, adverse effects of antiestrogen therapy counseling and coordination of care. All questions were answered. The patient knows to call the clinic with any problems, questions or concerns.    Benay Pike, MD 12/10/21

## 2021-12-10 NOTE — Assessment & Plan Note (Signed)
This is a very pleasant 66 year old female patient with past medical history significant for type 2 diabetes mellitus, hypertension who had an abnormal mammogram back in 2021 with 6 mm breast mass in the left breast at 930 o'clock as well as left abnormal axillary lymph nodes, did not undergo biopsy at that time as recommended who presented with abnormal mammogram again which led to further testing.  Left breast needle core biopsy at this time showed invasive ductal carcinoma, grade 2 along with DCIS, ER +80% strong intensity, PR +100% strong intensity, KI 5% and HER2 negative.  Lymph node biopsy was negative which was thought to be discordant.    She was presented in the breast [place] and recommendation was to proceed with surgery upfront followed by adjuvant recommendations based on final pathology.  She is here to review her pathology.  We only have a preliminary report so far.  This showed left invasive ductal carcinoma, 13 mm in greatest dimension, Nottingham grade 2, DCIS grade 2, all margins negative, 1 out of 5 axillary lymph nodes with micrometastasis, tumor measuring 1.3 mm, prognostics from lymph node are pending.  I called pathology about not having the prognostic reports from the lymph node biopsy since this was mentioned in the original report.  They are looking at the status of this at this time.  Oncotype DX showed a score of 11, there appears to be no benefit from chemotherapy.  Distant recurrence risk at 9 years of 13% with antiestrogen alone.  She is now s/p adjuvant radiation.  She is here to discuss about anti estrogen therapy. We have once again discussed today about mechanism of action of Tamoxifen as well as aromatase inhibitors. We have discussed adverse effects of Tamoxifen including but not limited to post menopausal symptoms, increased risk of DVT/PE, increased risk of endometrial cancer. A benefit would be improvement in bone density. We have then discussed about adverse effects of  aromatase inhibitors including but not limited to post menopausal symptoms such as hot flashes, vaginal dryness, bone loss, arthralgias.  She would like to proceed with anastrazole. Baseline bone density ordered as well. RTC in 3 months.

## 2021-12-24 ENCOUNTER — Telehealth: Payer: Self-pay | Admitting: *Deleted

## 2021-12-24 NOTE — Telephone Encounter (Signed)
RN attempt x1 to contact pt to offer survivorship care plan visit with Wilber Bihari, NP.  No answer, LVM for pt to return call to the office.

## 2022-01-24 ENCOUNTER — Ambulatory Visit: Payer: No Typology Code available for payment source | Attending: General Surgery

## 2022-01-24 ENCOUNTER — Ambulatory Visit
Admission: RE | Admit: 2022-01-24 | Discharge: 2022-01-24 | Disposition: A | Discharge status: Home / Self Care | Payer: No Typology Code available for payment source | Source: Ambulatory Visit | Attending: Radiation Oncology | Admitting: Radiation Oncology

## 2022-01-24 VITALS — Wt 197.2 lb

## 2022-01-24 DIAGNOSIS — C50212 Malignant neoplasm of upper-inner quadrant of left female breast: Secondary | ICD-10-CM

## 2022-01-24 DIAGNOSIS — Z483 Aftercare following surgery for neoplasm: Secondary | ICD-10-CM | POA: Insufficient documentation

## 2022-01-24 NOTE — Therapy (Addendum)
OUTPATIENT PHYSICAL THERAPY SOZO SCREENING NOTE   Patient Name: Kelli Vaughn MRN: [id number] DOB:[birth year], 66 y.o., female Today's Date: 01/24/2022  PCP: [name], MD REFERRING PROVIDER: [name], MD   PT End of Session - 01/24/22 0936     Visit Number 10   # unchanged due to screen only   PT Start Time 0934    PT Stop Time 0949    PT Time Calculation (min) 15 min    Activity Tolerance Patient tolerated treatment well    Behavior During Therapy WFL for tasks assessed/performed             Past Medical History:  Diagnosis Date   Complication of anesthesia    Diabetes mellitus without complication ([place])    TYPE II   Glaucoma    Hypertension    PONV (postoperative nausea and vomiting)    Past Surgical History:  Procedure Laterality Date   BREAST LUMPECTOMY WITH RADIOACTIVE SEED AND SENTINEL LYMPH NODE BIOPSY Left 09/21/2021   Procedure: LEFT BREAST LUMPECTOMY WITH RADIOACTIVE SEED X2 AND SENTINEL LYMPH NODE BIOPSY;  Surgeon: [name], MD;  Location: [facility];  Service: General;  Laterality: Left;   CESAREAN SECTION     X2   EYE SURGERY Bilateral 2019   cataract surgery   RADIOACTIVE SEED GUIDED AXILLARY SENTINEL LYMPH NODE Left 09/21/2021   Procedure: RADIOACTIVE SEED GUIDED AXILLARY SENTINEL LYMPH NODE BIOPSY;  Surgeon: [name], MD;  Location: [facility];  Service: General;  Laterality: Left;   Patient Active Problem List   Diagnosis Date Noted   Genetic testing 09/17/2021   Family history of breast cancer 09/01/2021   Malignant neoplasm of upper-inner quadrant of left breast in female, estrogen receptor positive ([place]) 08/31/2021    REFERRING DIAG: left breast cancer at risk for lymphedema  THERAPY DIAG:  Aftercare following surgery for neoplasm  PERTINENT HISTORY: Patient was diagnosed on 05/20/2021 with left grade II invasive ductal carcinoma breast cancer. It measures 3.7 cm and is located in the upper inner quadrant. It is ER/PR  positive and HER 2 negative with a Ki67 of 5%. 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 Surgery for left lumpectomy with SLNB was performed on 09/21/2021 with 1/5 LN's. Pt had a post op seroma  PRECAUTIONS: left UE Lymphedema risk, None  SUBJECTIVE: Pt returns for her 3 month L-Dex screen. "The inside of my breast has been firm since I finished radiation."   PAIN:  Are you having pain? No  SOZO SCREENING: Patient was assessed today using the SOZO machine to determine the lymphedema index score. This was compared to her baseline score. It was determined that she is within the recommended range when compared to her baseline and no further action is needed at this time. She will continue SOZO screenings. These are done every 3 months for 2 years post operatively followed by every 6 months for 2 years, and then annually.  Patient reported a change in status to PTA which initiated the PTA consulting with a PT. Upon brief palpation firmness noted at medial aspect of breast along with what felt like scar tissue superior to medial incision. PT determined it would be appropriate to initiate therapy at this time. Kelli Vaughn, PT requested a referral from patient's provider.   L-DEX FLOWSHEETS - 01/24/22 0900       L-DEX LYMPHEDEMA SCREENING   Measurement Type Unilateral    L-DEX MEASUREMENT EXTREMITY Upper Extremity    POSITION  Standing  DOMINANT SIDE Right    At Risk Side Left    BASELINE SCORE (UNILATERAL) 1.5    L-DEX SCORE (UNILATERAL) 2    VALUE CHANGE (UNILAT) 0.5              Otelia Limes, PTA 01/24/2022, 9:54 AM

## 2022-01-24 NOTE — Progress Notes (Signed)
  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 She developed fatigue and anticipated skin changes in the treatment field.    Impression/Plan: 1. 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  I was unable to reach the patient and could not leave a voicemail as her box was full. She will also continue to follow up with Dr. IrChryl Heckn medical oncology.        [employer]ola Rhine[employer]AC

## 2022-01-26 ENCOUNTER — Other Ambulatory Visit: Payer: Self-pay | Admitting: *Deleted

## 2022-01-26 ENCOUNTER — Ambulatory Visit: Payer: No Typology Code available for payment source | Admitting: Rehabilitation

## 2022-01-26 DIAGNOSIS — C50212 Malignant neoplasm of upper-inner quadrant of left female breast: Secondary | ICD-10-CM

## 2022-02-15 ENCOUNTER — Ambulatory Visit: Payer: Self-pay | Admitting: Rehabilitation

## 2022-02-28 ENCOUNTER — Ambulatory Visit: Payer: No Typology Code available for payment source

## 2022-03-02 ENCOUNTER — Ambulatory Visit: Payer: No Typology Code available for payment source | Attending: Hematology and Oncology | Admitting: Physical Therapy

## 2022-03-02 ENCOUNTER — Other Ambulatory Visit: Payer: Self-pay

## 2022-03-02 ENCOUNTER — Encounter: Payer: Self-pay | Admitting: Physical Therapy

## 2022-03-02 DIAGNOSIS — R600 Localized edema: Secondary | ICD-10-CM | POA: Insufficient documentation

## 2022-03-02 DIAGNOSIS — C50212 Malignant neoplasm of upper-inner quadrant of left female breast: Secondary | ICD-10-CM | POA: Insufficient documentation

## 2022-03-02 DIAGNOSIS — R293 Abnormal posture: Secondary | ICD-10-CM | POA: Insufficient documentation

## 2022-03-02 DIAGNOSIS — M6281 Muscle weakness (generalized): Secondary | ICD-10-CM | POA: Insufficient documentation

## 2022-03-02 DIAGNOSIS — Z170 Estrogen receptor positive status [ER+]: Secondary | ICD-10-CM | POA: Insufficient documentation

## 2022-03-02 DIAGNOSIS — M25612 Stiffness of left shoulder, not elsewhere classified: Secondary | ICD-10-CM | POA: Insufficient documentation

## 2022-03-02 DIAGNOSIS — Z483 Aftercare following surgery for neoplasm: Secondary | ICD-10-CM | POA: Insufficient documentation

## 2022-03-02 NOTE — Therapy (Signed)
OUTPATIENT PHYSICAL THERAPY ONCOLOGY EVALUATION  Patient Name: Kelli Vaughn MRN: [id number] DOB:01-14-1956, 66 y.o., female Today's Date: 03/02/2022   PT End of Session - 03/02/22 1010     Visit Number 1    Number of Visits 9    Date for PT Re-Evaluation 03/30/22    PT Start Time 1008    PT Stop Time 1050    PT Time Calculation (min) 42 min    Activity Tolerance Patient tolerated treatment well    Behavior During Therapy WFL for tasks assessed/performed             Past Medical History:  Diagnosis Date   Complication of anesthesia    Diabetes mellitus without complication ([place])    TYPE II   Glaucoma    Hypertension    PONV (postoperative nausea and vomiting)    Past Surgical History:  Procedure Laterality Date   BREAST LUMPECTOMY WITH RADIOACTIVE SEED AND SENTINEL LYMPH NODE BIOPSY Left 09/21/2021   Procedure: LEFT BREAST LUMPECTOMY WITH RADIOACTIVE SEED X2 AND SENTINEL LYMPH NODE BIOPSY;  Surgeon: [name], MD;  Location: [facility];  Service: General;  Laterality: Left;   CESAREAN SECTION     X2   EYE SURGERY Bilateral 2019   cataract surgery   RADIOACTIVE SEED GUIDED AXILLARY SENTINEL LYMPH NODE Left 09/21/2021   Procedure: RADIOACTIVE SEED GUIDED AXILLARY SENTINEL LYMPH NODE BIOPSY;  Surgeon: [name], MD;  Location: [facility];  Service: General;  Laterality: Left;   Patient Active Problem List   Diagnosis Date Noted   Genetic testing 09/17/2021   Family history of breast cancer 09/01/2021   Malignant neoplasm of upper-inner quadrant of left breast in female, estrogen receptor positive ([place]) 08/31/2021    PCP: [name], MD  REFERRING PROVIDER: Benay Pike   REFERRING DIAG: C50.212,Z17.0 (ICD-10-CM) - Malignant neoplasm of upper-inner quadrant of left breast in female, estrogen receptor positive ([place])  THERAPY DIAG:  Aftercare following surgery for neoplasm  Localized edema  Stiffness of left shoulder, not elsewhere  classified  Muscle weakness (generalized)  Abnormal posture  Malignant neoplasm of upper-inner quadrant of left breast in female, estrogen receptor positive ([place])  ONSET DATE: 09/21/21  Rationale for Evaluation and Treatment Rehabilitation  SUBJECTIVE                                                                                                                                                                                           SUBJECTIVE STATEMENT: The breast feels heavy and if you press on it it hurts. There are areas that are hard near the scar. I wear a compression bra everyday.  PERTINENT HISTORY:  Patient was diagnosed on 05/20/2021 with left grade II invasive ductal carcinoma breast cancer. It measures 3.7 cm and is located in the upper inner quadrant. It is ER/PR positive and HER 2 negative with a Ki67 of 5%. 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 Surgery for left lumpectomy with SLNB was performed on 09/21/2021 with 1/5 LN's. Pt had a post op seroma  PAIN:  Are you having pain? No  PRECAUTIONS: Other: DM, hypertension, L UE lymphedema risk  WEIGHT BEARING RESTRICTIONS No  FALLS:  Has patient fallen in last 6 months? 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  HAND DOMINANCE : right   PRIOR LEVEL OF FUNCTION: Independent  PATIENT GOALS to feel better, less pain in breast   OBJECTIVE  COGNITION:  Overall cognitive status: Within functional limits for tasks assessed   PALPATION: Increased fibrosis surrounding lumpectomy scar  OBSERVATIONS / OTHER ASSESSMENTS: discoloration in inferior breast  POSTURE: forward head and rounded shoulders  UPPER  EXTREMITY AROM/PROM:  A/PROM RIGHT   eval   Shoulder extension 65  Shoulder flexion 172  Shoulder abduction 168  Shoulder internal rotation 38  Shoulder external rotation 90    (Blank rows = not tested)  A/PROM LEFT   eval  Shoulder extension 57  Shoulder flexion 165  Shoulder abduction 150  Shoulder internal rotation 65  Shoulder external rotation 90    (Blank rows = not tested)    LYMPHEDEMA ASSESSMENTS:   SURGERY TYPE/DATE: left lumpectomy with SLNB 09/21/21  NUMBER OF LYMPH NODES REMOVED: 1/5  CHEMOTHERAPY: none  RADIATION:completed  HORMONE TREATMENT: currently on anastrozole  INFECTIONS: none  LYMPHEDEMA ASSESSMENTS:   LANDMARK RIGHT  eval  10 cm proximal to olecranon process 30.5  Olecranon process 27  10 cm proximal to ulnar styloid process 23  Just proximal to ulnar styloid process 18  Across hand at thumb web space 19.7  At base of 2nd digit 6.3  (Blank rows = not tested)  LANDMARK LEFT  eval  10 cm proximal to olecranon process 34.5  Olecranon process 27  10 cm proximal to ulnar styloid process 21.7  Just proximal to ulnar styloid process 17.4  Across hand at thumb web space 18.6  At base of 2nd digit 6  (Blank rows = not tested)   BREAST COMPLAINTS SCALE: 27   TODAY'S TREATMENT  03/02/22- educated pt in importance of starting a walking program and how to progress the walking program over time to decrease fatigue  PATIENT EDUCATION:  Education details: importance of a walking program to decrease cancer related fatigue Person educated: Patient Education method: Explanation Education comprehension: verbalized understanding   HOME EXERCISE PROGRAM: Walking program  ASSESSMENT:  CLINICAL IMPRESSION: Patient is a 66 y.o. female who was seen today for physical therapy evaluation and treatment for L breast pain. Pt has pain and discomfort in her left breast in area of her lumpectomy scar with has increased scar tissue that is palpable.  She also still has significant discoloration in inferior breast most likely from dye during surgery. There is very minimal edema in inferior L breast. Her left upper arm was 4 cm larger than the right but at baseline her left upper  arm was 2 cm larger than right. Her SOZO score last month was normal. Will reassess this while she is receiving treatment here. L shoulder ROM is limited when compared to the R. Pt would benefit from skilled PT services to increase L shoulder ROM, decrease breast pain and instruct pt in ABC exercise program for long term strengthening to help decrease fatigue.     OBJECTIVE IMPAIRMENTS decreased knowledge of condition, decreased ROM, increased edema, increased fascial restrictions, postural dysfunction, and pain.   ACTIVITY LIMITATIONS  none reported   PARTICIPATION LIMITATIONS:  pt just limited in daily activities due to need for recovery periods due to [place] Time since onset of injury/illness/exacerbation are also affecting patient's functional outcome.   REHAB POTENTIAL: Good  CLINICAL DECISION MAKING: Stable/uncomplicated  EVALUATION COMPLEXITY: Low  GOALS: Goals reviewed with patient? Yes  SHORT TERM GOALS=LONG TERM GOALS  Target date: 03/30/2022    Pt will demonstrate 168 degrees of L shoulder abduction to allow her to reach out to the side.  Baseline: Goal status: INITIAL  2.  Pt will report a 75% improvement in pain and discomfort in L breast to allow improved comfort.  Baseline:  Goal status: INITIAL  3.  Pt will be independent in Strength ABC program for continued stretching and strengthening at home.  Baseline:  Goal status: INITIAL  4.  Pt will be independent in scar mobilization to decrease discomfort in L breast.  Baseline:  Goal status: INITIAL    PLAN: PT FREQUENCY: 2x/week  PT DURATION: 4 weeks  PLANNED INTERVENTIONS: Therapeutic exercises, Therapeutic activity, Patient/Family education, Self Care, Joint  mobilization, Manual lymph drainage, scar mobilization, Taping, and Manual therapy  PLAN FOR NEXT SESSION: scar mobilization to lumpectomy scar, PROM to L shoulder, MLD to L breast, eventually teach Strength ABC   [employer], PT 03/02/2022, 12:14 PM

## 2022-03-08 ENCOUNTER — Ambulatory Visit: Payer: No Typology Code available for payment source

## 2022-03-08 DIAGNOSIS — R293 Abnormal posture: Secondary | ICD-10-CM

## 2022-03-08 DIAGNOSIS — M6281 Muscle weakness (generalized): Secondary | ICD-10-CM

## 2022-03-08 DIAGNOSIS — Z483 Aftercare following surgery for neoplasm: Secondary | ICD-10-CM

## 2022-03-08 DIAGNOSIS — R600 Localized edema: Secondary | ICD-10-CM

## 2022-03-08 DIAGNOSIS — M25612 Stiffness of left shoulder, not elsewhere classified: Secondary | ICD-10-CM

## 2022-03-08 DIAGNOSIS — Z170 Estrogen receptor positive status [ER+]: Secondary | ICD-10-CM

## 2022-03-08 NOTE — Therapy (Signed)
OUTPATIENT PHYSICAL THERAPY ONCOLOGY TREATMENT  Patient Name: Kelli Vaughn MRN: [id number] DOB:December 23, 1955, 66 y.o., female Today's Date: 03/08/2022   PT End of Session - 03/08/22 0957     Visit Number 2    Number of Visits 9    Date for PT Re-Evaluation 03/30/22    PT Start Time 0909   pt arrived late   PT Stop Time 0957    PT Time Calculation (min) 48 min    Activity Tolerance Patient tolerated treatment well    Behavior During Therapy WFL for tasks assessed/performed             Past Medical History:  Diagnosis Date   Complication of anesthesia    Diabetes mellitus without complication ([place])    TYPE II   Glaucoma    Hypertension    PONV (postoperative nausea and vomiting)    Past Surgical History:  Procedure Laterality Date   BREAST LUMPECTOMY WITH RADIOACTIVE SEED AND SENTINEL LYMPH NODE BIOPSY Left 09/21/2021   Procedure: LEFT BREAST LUMPECTOMY WITH RADIOACTIVE SEED X2 AND SENTINEL LYMPH NODE BIOPSY;  Surgeon: [name], MD;  Location: [facility];  Service: General;  Laterality: Left;   CESAREAN SECTION     X2   EYE SURGERY Bilateral 2019   cataract surgery   RADIOACTIVE SEED GUIDED AXILLARY SENTINEL LYMPH NODE Left 09/21/2021   Procedure: RADIOACTIVE SEED GUIDED AXILLARY SENTINEL LYMPH NODE BIOPSY;  Surgeon: [name], MD;  Location: [facility];  Service: General;  Laterality: Left;   Patient Active Problem List   Diagnosis Date Noted   Genetic testing 09/17/2021   Family history of breast cancer 09/01/2021   Malignant neoplasm of upper-inner quadrant of left breast in female, estrogen receptor positive ([place]) 08/31/2021    PCP: [name], MD  REFERRING PROVIDER: Benay Pike   REFERRING DIAG: C50.212,Z17.0 (ICD-10-CM) - Malignant neoplasm of upper-inner quadrant of left breast in female, estrogen receptor positive ([place])  THERAPY DIAG:  Aftercare following surgery for neoplasm  Localized edema  Stiffness of left shoulder, not elsewhere  classified  Muscle weakness (generalized)  Abnormal posture  Malignant neoplasm of upper-inner quadrant of left breast in female, estrogen receptor positive ([place])  ONSET DATE: 09/21/21  Rationale for Evaluation and Treatment Rehabilitation  SUBJECTIVE                                                                                                                                                                                           SUBJECTIVE STATEMENT: The Lt side of my breast and armpit are tender.   PERTINENT HISTORY:  Patient was diagnosed on 05/20/2021 with left grade  II invasive ductal carcinoma breast cancer. It measures 3.7 cm and is located in the upper inner quadrant. It is ER/PR positive and HER 2 negative with a Ki67 of 5%. 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 Surgery for left lumpectomy with SLNB was performed on 09/21/2021 with 1/5 LN's. Pt had a post op seroma  PAIN:  Are you having pain? Yes, breast just feels tender to touch, like a 4/10  PRECAUTIONS: Other: DM, hypertension, L UE lymphedema risk  WEIGHT BEARING RESTRICTIONS No  FALLS:  Has patient fallen in last 6 months? 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  HAND DOMINANCE : right   PRIOR LEVEL OF FUNCTION: Independent  PATIENT GOALS to feel better, less pain in breast   OBJECTIVE  COGNITION:  Overall cognitive status: Within functional limits for tasks assessed   PALPATION: Increased fibrosis surrounding lumpectomy scar  OBSERVATIONS / OTHER ASSESSMENTS: discoloration in inferior breast  POSTURE: forward head and rounded shoulders  UPPER EXTREMITY AROM/PROM:  A/PROM RIGHT    eval   Shoulder extension 65  Shoulder flexion 172  Shoulder abduction 168  Shoulder internal rotation 38  Shoulder external rotation 90    (Blank rows = not tested)  A/PROM LEFT   eval  Shoulder extension 57  Shoulder flexion 165  Shoulder abduction 150  Shoulder internal rotation 65  Shoulder external rotation 90    (Blank rows = not tested)    LYMPHEDEMA ASSESSMENTS:   SURGERY TYPE/DATE: left lumpectomy with SLNB 09/21/21  NUMBER OF LYMPH NODES REMOVED: 1/5  CHEMOTHERAPY: none  RADIATION:completed  HORMONE TREATMENT: currently on anastrozole  INFECTIONS: none  LYMPHEDEMA ASSESSMENTS:   LANDMARK RIGHT  eval  10 cm proximal to olecranon process 30.5  Olecranon process 27  10 cm proximal to ulnar styloid process 23  Just proximal to ulnar styloid process 18  Across hand at thumb web space 19.7  At base of 2nd digit 6.3  (Blank rows = not tested)  LANDMARK LEFT  eval  10 cm proximal to olecranon process 34.5  Olecranon process 27  10 cm proximal to ulnar styloid process 21.7  Just proximal to ulnar styloid process 17.4  Across hand at thumb web space 18.6  At base of 2nd digit 6  (Blank rows = not tested)   BREAST COMPLAINTS SCALE: 27   TODAY'S TREATMENT  03/08/22- SOZO redone- change from baseline increased from 0.5 on 7/17 to 6.1 today Manual Therapy MLD: In Supine with pt permission granted first: Short neck, 5 diaphragmatic breaths, Lt inguinal and Rt axillary nodes, then Lt axillo-inguinal and anterior inter-axillary anastomosis, then focused on medial aspect of Lt breast where scar tissue and some fibrosis palpated.  P/ROM to Lt shoulder throughout MLD into flexion, abd and D2 with scapular depression throughout MFR to LT axilla during P/ROM where pt palpably very tight with scar tissue and reports mod tender to touch, this softened well by end of session Scapular Mobs in Rt S/L into protraction and retraction with STM to medial scap  border Edema Management: Issued peach medi foam with small dots in thin stockinette for pt to wear at her medial breast at incision in her compression bra  03/02/22- educated pt in  importance of starting a walking program and how to progress the walking program over time to decrease fatigue   PATIENT EDUCATION:  Education details: importance of a walking program to decrease cancer related fatigue Person educated: Patient Education method: Explanation Education comprehension: verbalized understanding   HOME EXERCISE PROGRAM: Walking program  ASSESSMENT:  CLINICAL IMPRESSION: Pts son interpreted at beginning and end of session (he was not in room during manual therapy). Redid her SOZO which was elevated from her July reading and though she does not have subclinical lymphedema today (6.1 today, 6.5 is subclinical) it was very elevated from her 0.5 July score potentially accounting for increased edema she has noticed in her upper arm recently. Today began manual therapy for scar tissue and mild fibrosis at medial breast incision, MFR to scar tissue in Lt axilla and P/ROM of Lt shoulder. Pt reports feeling much improved with less tightness by end of session. Her questions were answered with her son at end of session and was able to further explain MLD sequence then as well.    OBJECTIVE IMPAIRMENTS decreased knowledge of condition, decreased ROM, increased edema, increased fascial restrictions, postural dysfunction, and pain.   ACTIVITY LIMITATIONS  none reported   PARTICIPATION LIMITATIONS:  pt just limited in daily activities due to need for recovery periods due to [place] Time since onset of injury/illness/exacerbation are also affecting patient's functional outcome.   REHAB POTENTIAL: Good  CLINICAL DECISION MAKING: Stable/uncomplicated  EVALUATION COMPLEXITY: Low  GOALS: Goals reviewed with patient? Yes  SHORT TERM GOALS=LONG TERM GOALS  Target date: 03/30/2022     Pt will demonstrate 168 degrees of L shoulder abduction to allow her to reach out to the side.  Baseline: Goal status: INITIAL  2.  Pt will report a 75% improvement in pain and discomfort in L breast to allow improved comfort.  Baseline:  Goal status: INITIAL  3.  Pt will be independent in Strength ABC program for continued stretching and strengthening at home.  Baseline:  Goal status: INITIAL  4.  Pt will be independent in scar mobilization to decrease discomfort in L breast.  Baseline:  Goal status: INITIAL    PLAN: PT FREQUENCY: 2x/week  PT DURATION: 4 weeks  PLANNED INTERVENTIONS: Therapeutic exercises, Therapeutic activity, Patient/Family education, Self Care, Joint mobilization, Manual lymph drainage, scar mobilization, Taping, and Manual therapy  PLAN FOR NEXT SESSION: How was peach foam? scar mobilization to lumpectomy scar, PROM to L shoulder, MLD to L breast, eventually teach Strength ABC   Otelia Limes, PTA 03/08/2022, 9:58 AM

## 2022-03-10 ENCOUNTER — Ambulatory Visit: Payer: No Typology Code available for payment source | Admitting: Physical Therapy

## 2022-03-10 ENCOUNTER — Encounter: Payer: Self-pay | Admitting: Physical Therapy

## 2022-03-10 DIAGNOSIS — M6281 Muscle weakness (generalized): Secondary | ICD-10-CM

## 2022-03-10 DIAGNOSIS — R293 Abnormal posture: Secondary | ICD-10-CM

## 2022-03-10 DIAGNOSIS — Z483 Aftercare following surgery for neoplasm: Secondary | ICD-10-CM

## 2022-03-10 DIAGNOSIS — M25612 Stiffness of left shoulder, not elsewhere classified: Secondary | ICD-10-CM

## 2022-03-10 DIAGNOSIS — R600 Localized edema: Secondary | ICD-10-CM

## 2022-03-10 DIAGNOSIS — Z170 Estrogen receptor positive status [ER+]: Secondary | ICD-10-CM

## 2022-03-10 NOTE — Therapy (Signed)
OUTPATIENT PHYSICAL THERAPY ONCOLOGY TREATMENT  Patient Name: Kelli Vaughn MRN: [id number] DOB:[birth year], 66 y.o., female Today's Date: 03/10/2022   PT End of Session - 03/10/22 0951     Visit Number 3    Number of Visits 9    Date for PT Re-Evaluation 03/30/22    PT Start Time 0906    PT Stop Time 0952    PT Time Calculation (min) 46 min    Activity Tolerance Patient tolerated treatment well    Behavior During Therapy WFL for tasks assessed/performed              Past Medical History:  Diagnosis Date   Complication of anesthesia    Diabetes mellitus without complication ([place])    TYPE II   Glaucoma    Hypertension    PONV (postoperative nausea and vomiting)    Past Surgical History:  Procedure Laterality Date   BREAST LUMPECTOMY WITH RADIOACTIVE SEED AND SENTINEL LYMPH NODE BIOPSY Left 09/21/2021   Procedure: LEFT BREAST LUMPECTOMY WITH RADIOACTIVE SEED X2 AND SENTINEL LYMPH NODE BIOPSY;  Surgeon: [name], MD;  Location: [facility];  Service: General;  Laterality: Left;   CESAREAN SECTION     X2   EYE SURGERY Bilateral 2019   cataract surgery   RADIOACTIVE SEED GUIDED AXILLARY SENTINEL LYMPH NODE Left 09/21/2021   Procedure: RADIOACTIVE SEED GUIDED AXILLARY SENTINEL LYMPH NODE BIOPSY;  Surgeon: [name], MD;  Location: [facility];  Service: General;  Laterality: Left;   Patient Active Problem List   Diagnosis Date Noted   Genetic testing 09/17/2021   Family history of breast cancer 09/01/2021   Malignant neoplasm of upper-inner quadrant of left breast in female, estrogen receptor positive ([place]) 08/31/2021    PCP: [name], MD  REFERRING PROVIDER: Benay Pike   REFERRING DIAG: C50.212,Z17.0 (ICD-10-CM) - Malignant neoplasm of upper-inner quadrant of left breast in female, estrogen receptor positive ([place])  THERAPY DIAG:  Aftercare following surgery for neoplasm  Localized edema  Stiffness of left shoulder, not elsewhere  classified  Muscle weakness (generalized)  Abnormal posture  Malignant neoplasm of upper-inner quadrant of left breast in female, estrogen receptor positive ([place])  ONSET DATE: 09/21/21  Rationale for Evaluation and Treatment Rehabilitation  SUBJECTIVE                                                                                                                                                                                           SUBJECTIVE STATEMENT: The treatment helped with Mateo Flow.    PERTINENT HISTORY:  Patient was diagnosed on 05/20/2021 with left grade II invasive ductal carcinoma breast cancer. It  measures 3.7 cm and is located in the upper inner quadrant. It is ER/PR positive and HER 2 negative with a Ki67 of 5%. 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 Surgery for left lumpectomy with SLNB was performed on 09/21/2021 with 1/5 LN's. Pt had a post op seroma  PAIN:  Are you having pain? No pain  PRECAUTIONS: Other: DM, hypertension, L UE lymphedema risk  WEIGHT BEARING RESTRICTIONS No  FALLS:  Has patient fallen in last 6 months? 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  HAND DOMINANCE : right   PRIOR LEVEL OF FUNCTION: Independent  PATIENT GOALS to feel better, less pain in breast   OBJECTIVE  COGNITION:  Overall cognitive status: Within functional limits for tasks assessed   PALPATION: Increased fibrosis surrounding lumpectomy scar  OBSERVATIONS / OTHER ASSESSMENTS: discoloration in inferior breast  POSTURE: forward head and rounded shoulders  UPPER EXTREMITY AROM/PROM:  A/PROM RIGHT   eval   Shoulder extension 65  Shoulder flexion 172   Shoulder abduction 168  Shoulder internal rotation 38  Shoulder external rotation 90    (Blank rows = not tested)  A/PROM LEFT   eval  Shoulder extension 57  Shoulder flexion 165  Shoulder abduction 150  Shoulder internal rotation 65  Shoulder external rotation 90    (Blank rows = not tested)    LYMPHEDEMA ASSESSMENTS:   SURGERY TYPE/DATE: left lumpectomy with SLNB 09/21/21  NUMBER OF LYMPH NODES REMOVED: 1/5  CHEMOTHERAPY: none  RADIATION:completed  HORMONE TREATMENT: currently on anastrozole  INFECTIONS: none  LYMPHEDEMA ASSESSMENTS:   LANDMARK RIGHT  eval  10 cm proximal to olecranon process 30.5  Olecranon process 27  10 cm proximal to ulnar styloid process 23  Just proximal to ulnar styloid process 18  Across hand at thumb web space 19.7  At base of 2nd digit 6.3  (Blank rows = not tested)  LANDMARK LEFT  eval  10 cm proximal to olecranon process 34.5  Olecranon process 27  10 cm proximal to ulnar styloid process 21.7  Just proximal to ulnar styloid process 17.4  Across hand at thumb web space 18.6  At base of 2nd digit 6  (Blank rows = not tested)   BREAST COMPLAINTS SCALE: 27   TODAY'S TREATMENT  03/10/22- Manual Therapy MLD: In Supine with pt permission granted first: Short neck, 5 diaphragmatic breaths, Lt inguinal and Rt axillary nodes, then Lt axillo-inguinal and anterior inter-axillary anastomosis, then focused on medial aspect of Lt breast where scar tissue and some fibrosis palpated.  P/ROM to Lt shoulder throughout MLD into flexion, abd and ER with MFR to LT axilla during P/ROM where pt palpably very tight with scar tissue and reports mod tender to touch, this softened well by end of session, also one cord palpable in this area so focused on it as well  03/08/22- SOZO redone- change from baseline increased from 0.5 on 7/17 to 6.1 today Manual Therapy MLD: In Supine with pt permission granted first: Short neck, 5 diaphragmatic breaths, Lt  inguinal and Rt axillary nodes, then Lt axillo-inguinal and anterior inter-axillary anastomosis, then focused on medial aspect of Lt breast where scar tissue and some fibrosis palpated.  P/ROM to Lt shoulder throughout  MLD into flexion, abd and D2 with scapular depression throughout MFR to LT axilla during P/ROM where pt palpably very tight with scar tissue and reports mod tender to touch, this softened well by end of session Scapular Mobs in Rt S/L into protraction and retraction with STM to medial scap border Edema Management: Issued peach medi foam with small dots in thin stockinette for pt to wear at her medial breast at incision in her compression bra  03/02/22- educated pt in importance of starting a walking program and how to progress the walking program over time to decrease fatigue   PATIENT EDUCATION:  Education details: importance of a walking program to decrease cancer related fatigue Person educated: Patient Education method: Explanation Education comprehension: verbalized understanding   HOME EXERCISE PROGRAM: Walking program  ASSESSMENT:  CLINICAL IMPRESSION: Continued with MLD to L breast with focus on fibrosis at medial breast that has been improving. Was able to palpate a cord in L axilla today where pt has increased tenderness. Pt was very tight at beginning of session but improved to nearly full PROM by end of session and was not limited by cording. Educated pt on importance of stretching throughout the day to reduce cording.    OBJECTIVE IMPAIRMENTS decreased knowledge of condition, decreased ROM, increased edema, increased fascial restrictions, postural dysfunction, and pain.   ACTIVITY LIMITATIONS  none reported   PARTICIPATION LIMITATIONS:  pt just limited in daily activities due to need for recovery periods due to [place] Time since onset of injury/illness/exacerbation are also affecting patient's functional outcome.   REHAB POTENTIAL:  Good  CLINICAL DECISION MAKING: Stable/uncomplicated  EVALUATION COMPLEXITY: Low  GOALS: Goals reviewed with patient? Yes  SHORT TERM GOALS=LONG TERM GOALS  Target date: 03/30/2022    Pt will demonstrate 168 degrees of L shoulder abduction to allow her to reach out to the side.  Baseline: Goal status: INITIAL  2.  Pt will report a 75% improvement in pain and discomfort in L breast to allow improved comfort.  Baseline:  Goal status: INITIAL  3.  Pt will be independent in Strength ABC program for continued stretching and strengthening at home.  Baseline:  Goal status: INITIAL  4.  Pt will be independent in scar mobilization to decrease discomfort in L breast.  Baseline:  Goal status: INITIAL    PLAN: PT FREQUENCY: 2x/week  PT DURATION: 4 weeks  PLANNED INTERVENTIONS: Therapeutic exercises, Therapeutic activity, Patient/Family education, Self Care, Joint mobilization, Manual lymph drainage, scar mobilization, Taping, and Manual therapy  PLAN FOR NEXT SESSION: MFR to cording in L axilla, scar mobilization to lumpectomy scar, PROM to L shoulder, MLD to L breast, eventually teach Strength ABC   Allyson Sabal Blue, PT 03/10/2022, 9:55 AM

## 2022-03-11 ENCOUNTER — Encounter: Payer: Self-pay | Admitting: Adult Health

## 2022-03-11 ENCOUNTER — Ambulatory Visit (HOSPITAL_COMMUNITY)
Admission: RE | Admit: 2022-03-11 | Discharge: 2022-03-11 | Disposition: A | Discharge status: Home / Self Care | Payer: Self-pay | Source: Ambulatory Visit | Attending: Adult Health | Admitting: Adult Health

## 2022-03-11 ENCOUNTER — Inpatient Hospital Stay: Payer: No Typology Code available for payment source | Attending: Hematology and Oncology

## 2022-03-11 ENCOUNTER — Inpatient Hospital Stay (HOSPITAL_BASED_OUTPATIENT_CLINIC_OR_DEPARTMENT_OTHER): Payer: No Typology Code available for payment source | Admitting: Adult Health

## 2022-03-11 ENCOUNTER — Other Ambulatory Visit: Payer: Self-pay

## 2022-03-11 VITALS — BP 151/76 | HR 78 | Temp 97.5°F | Resp 16 | Wt 196.4 lb

## 2022-03-11 DIAGNOSIS — C50212 Malignant neoplasm of upper-inner quadrant of left female breast: Secondary | ICD-10-CM

## 2022-03-11 DIAGNOSIS — C773 Secondary and unspecified malignant neoplasm of axilla and upper limb lymph nodes: Secondary | ICD-10-CM | POA: Insufficient documentation

## 2022-03-11 DIAGNOSIS — Z923 Personal history of irradiation: Secondary | ICD-10-CM | POA: Insufficient documentation

## 2022-03-11 DIAGNOSIS — M79621 Pain in right upper arm: Secondary | ICD-10-CM | POA: Insufficient documentation

## 2022-03-11 DIAGNOSIS — I10 Essential (primary) hypertension: Secondary | ICD-10-CM | POA: Insufficient documentation

## 2022-03-11 DIAGNOSIS — Z800 Family history of malignant neoplasm of digestive organs: Secondary | ICD-10-CM

## 2022-03-11 DIAGNOSIS — L989 Disorder of the skin and subcutaneous tissue, unspecified: Secondary | ICD-10-CM

## 2022-03-11 DIAGNOSIS — Z803 Family history of malignant neoplasm of breast: Secondary | ICD-10-CM

## 2022-03-11 DIAGNOSIS — Z170 Estrogen receptor positive status [ER+]: Secondary | ICD-10-CM | POA: Insufficient documentation

## 2022-03-11 DIAGNOSIS — Z79811 Long term (current) use of aromatase inhibitors: Secondary | ICD-10-CM

## 2022-03-11 DIAGNOSIS — R5383 Other fatigue: Secondary | ICD-10-CM

## 2022-03-11 DIAGNOSIS — E119 Type 2 diabetes mellitus without complications: Secondary | ICD-10-CM

## 2022-03-11 DIAGNOSIS — E559 Vitamin D deficiency, unspecified: Secondary | ICD-10-CM

## 2022-03-11 DIAGNOSIS — I890 Lymphedema, not elsewhere classified: Secondary | ICD-10-CM

## 2022-03-11 DIAGNOSIS — Z79899 Other long term (current) drug therapy: Secondary | ICD-10-CM | POA: Insufficient documentation

## 2022-03-11 DIAGNOSIS — R232 Flushing: Secondary | ICD-10-CM | POA: Insufficient documentation

## 2022-03-11 DIAGNOSIS — Z87891 Personal history of nicotine dependence: Secondary | ICD-10-CM | POA: Insufficient documentation

## 2022-03-11 LAB — CBC WITH DIFFERENTIAL (CANCER CENTER ONLY)
Abs Immature Granulocytes: 0.04 10*3/uL (ref 0.00–0.07)
Basophils Absolute: 0.1 10*3/uL (ref 0.0–0.1)
Basophils Relative: 1 %
Eosinophils Absolute: 0.4 10*3/uL (ref 0.0–0.5)
Eosinophils Relative: 4 %
HCT: 40.7 % (ref 36.0–46.0)
Hemoglobin: 13.6 g/dL (ref 12.0–15.0)
Immature Granulocytes: 1 %
Lymphocytes Relative: 26 %
Lymphs Abs: 2.2 10*3/uL (ref 0.7–4.0)
MCH: 28.9 pg (ref 26.0–34.0)
MCHC: 33.4 g/dL (ref 30.0–36.0)
MCV: 86.4 fL (ref 80.0–100.0)
Monocytes Absolute: 0.6 10*3/uL (ref 0.1–1.0)
Monocytes Relative: 7 %
Neutro Abs: 5.4 10*3/uL (ref 1.7–7.7)
Neutrophils Relative %: 61 %
Platelet Count: 283 10*3/uL (ref 150–400)
RBC: 4.71 MIL/uL (ref 3.87–5.11)
RDW: 13.2 % (ref 11.5–15.5)
WBC Count: 8.6 10*3/uL (ref 4.0–10.5)
nRBC: 0 % (ref 0.0–0.2)

## 2022-03-11 LAB — CMP (CANCER CENTER ONLY)
ALT: 30 U/L (ref 0–44)
AST: 17 U/L (ref 15–41)
Albumin: 4.1 g/dL (ref 3.5–5.0)
Alkaline Phosphatase: 101 U/L (ref 38–126)
Anion gap: 4 — ABNORMAL LOW (ref 5–15)
BUN: 15 mg/dL (ref 8–23)
CO2: 30 mmol/L (ref 22–32)
Calcium: 10.5 mg/dL — ABNORMAL HIGH (ref 8.9–10.3)
Chloride: 103 mmol/L (ref 98–111)
Creatinine: 0.53 mg/dL (ref 0.44–1.00)
GFR, Estimated: >60 mL/min (ref >60)
Glucose, Bld: 150 mg/dL — ABNORMAL HIGH (ref 70–99)
Potassium: 4.1 mmol/L (ref 3.5–5.1)
Sodium: 137 mmol/L (ref 135–145)
Total Bilirubin: 0.3 mg/dL (ref 0.3–1.2)
Total Protein: 7.1 g/dL (ref 6.5–8.1)

## 2022-03-11 LAB — IRON AND IRON BINDING CAPACITY (CC-WL,HP ONLY)
Iron: 49 ug/dL (ref 28–170)
Saturation Ratios: 11 % (ref 10.4–31.8)
TIBC: 434 ug/dL (ref 250–450)
UIBC: 385 ug/dL (ref 148–442)

## 2022-03-11 LAB — VITAMIN D 25 HYDROXY (VIT D DEFICIENCY, FRACTURES): Vit D, 25-Hydroxy: 21.43 ng/mL — ABNORMAL LOW (ref 30–100)

## 2022-03-11 LAB — FERRITIN: Ferritin: 18 ng/mL (ref 11–307)

## 2022-03-11 LAB — VITAMIN B12: Vitamin B-12: 251 pg/mL (ref 180–914)

## 2022-03-11 NOTE — Progress Notes (Signed)
SURVIVORSHIP VISIT:   BRIEF ONCOLOGIC HISTORY:  Oncology History Overview Note  Patient was called back for evaluation of LEFT breast calcifications, focal asymmetry, and possible enlarged LEFT axillary lymph node from screening mammogram at [facility] on 05/20/2021. 08/12/2021, Suspicious mass in the 9:30 o'clock location of the LEFT breast warranting tissue diagnosis. Two LEFT axillary lymph nodes with abnormal morphology. 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 Lymph node biopsy of left axilla negative, lymph node biopsy results were thought to be discordant.   Patient does recollect being asked to schedule biopsy back in 2021 for abnormal mammogram but she was worried or had this misinformation that biopsy can let the breast cancer spread to other parts hence she decided not to proceed with biopsy at that time. She is here with her 2 sons, speaks in [place], [employer] interpreter present at the time of my visit.  Son also help with the translation.   Malignant neoplasm of upper-inner quadrant of left breast in female, estrogen receptor positive ([place])  08/31/2021 Initial Diagnosis   Malignant neoplasm of upper-inner quadrant of left breast in female, estrogen receptor positive ([place])   09/01/2021 Cancer Staging   Staging form: Breast, AJCC 8th Edition - Clinical stage from 09/01/2021: Stage Unknown (cT1b, cNX, cM0, G2, ER+, PR+, HER2-) - Signed by Benay Pike, MD on 09/01/2021 Stage prefix: Initial diagnosis Histologic grading system: 3 grade system    Genetic Testing   [facility] CancerNext Panel was Negative. 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 Report date is 09/20/2021.  The CancerNext gene panel offered by [employer] includes sequencing, rearrangement analysis, and RNA analysis for the following 36 genes:   APC, ATM,  AXIN2, BARD1, BMPR1A, BRCA1, BRCA2, BRIP1, CDH1, CDK4, CDKN2A, CHEK2, DICER1, HOXB13, EPCAM, GREM1, MLH1, MSH2, MSH3, MSH6, MUTYH, NBN, NF1, NTHL1, PALB2, PMS2, POLD1, POLE, PTEN, RAD51C, RAD51D, RECQL, SMAD4, SMARCA4, STK11, and TP53.    09/21/2021 Surgery   Patient had left breast lumpectomy with a sentinel lymph node excision on 09/21/2021.  Final pathology showed invasive carcinoma of no special type, 13 mm in greatest dimension, grade 2, DCIS grade 2, all margins negative for invasive carcinoma and DCIS, distance from invasive carcinoma to closest margin is 0.5 mm, posterior margin and distance from DCIS to closest margin, 1 mm posterior margin.  5 axillary lymph nodes removed.  1 axillary lymph node with micrometastasis, metastatic deposit of 0.3 mm in greatest dimension.   Prognostics ER +80% moderate weak staining intensity, PR +100% strong staining intensity and HER2 negative    Oncotype testing   Oncotype DX score of 11.  Distant recurrence risk at 9 years of 13% with antiestrogen therapy.  No benefit from chemotherapy   11/11/2021 - 12/09/2021 Radiation Therapy    Treatment Dates: 11/11/2021 through 12/09/2021 Site Technique Total Dose (Gy) Dose per Fx (Gy) Completed Fx Beam Energies  Breast, Left: Breast_L 3D 42.56/42.56 2.66 16/16 10X  Breast, Left: Breast_L_Bst 3D 8/8 2 4/4 6X, 10X      12/2021 -  Anti-estrogen oral therapy   Letrozole daily     INTERVAL HISTORY:  Kelli Vaughn to review her survivorship care plan detailing her treatment course for breast cancer, as well as monitoring long-term side effects of that treatment, education regarding health maintenance, screening, and overall wellness and health promotion.     Overall, Kelli Vaughn  reports feeling moderately well.  She is taking Anastrozole daily and is experiencing some hot flashes.  She initially experienced nausea, and this has since resolved.    Kelli Vaughn is increasingly fatigued.  She feels exhausted and struggles to keep her house  clean, and takes naps during the day.    She has + left arm lymphedema which makes her arm feel heavy and she doesn't garden like she did previously due to this.  She is seeing PT for lymphedema treatment.  She also c/o right humerus pain that has been present for 2-3 months.  She takes ibuprofen for the pain which helps, and takes a couple times a week because she avoids taking medicine unless absolutely needed.   REVIEW OF SYSTEMS:  Review of Systems  Constitutional:  Positive for fatigue. Negative for appetite change, chills, fever and unexpected weight change.  HENT:   Negative for hearing loss, lump/mass and trouble swallowing.   Eyes:  Negative for eye problems and icterus.  Respiratory:  Negative for chest tightness, cough and shortness of breath.   Cardiovascular:  Negative for chest pain, leg swelling and palpitations.  Gastrointestinal:  Negative for abdominal distention, abdominal pain, constipation, diarrhea, nausea and vomiting.  Endocrine: Negative for hot flashes.  Genitourinary:  Negative for difficulty urinating.   Musculoskeletal:  Negative for arthralgias.  Skin:  Negative for itching and rash.  Neurological:  Negative for dizziness, extremity weakness, headaches and numbness.  Hematological:  Negative for adenopathy. Does not bruise/bleed easily.  Psychiatric/Behavioral:  Negative for depression. The patient is not nervous/anxious.   Breast: Denies any new nodularity, masses, tenderness, nipple changes, or nipple discharge.      ONCOLOGY TREATMENT TEAM:  1. Surgeon:  Dr. Barry Dienes at [facility] 2. Medical Oncologist: Dr. Chryl Heck  3. Radiation Oncologist: Dr. Lisbeth Renshaw    PAST MEDICAL/SURGICAL HISTORY:  Past Medical History:  Diagnosis Date   Complication of anesthesia    Diabetes mellitus without complication ([place])    TYPE II   Glaucoma    Hypertension    PONV (postoperative nausea and vomiting)    Past Surgical History:  Procedure Laterality Date    BREAST LUMPECTOMY WITH RADIOACTIVE SEED AND SENTINEL LYMPH NODE BIOPSY Left 09/21/2021   Procedure: LEFT BREAST LUMPECTOMY WITH RADIOACTIVE SEED X2 AND SENTINEL LYMPH NODE BIOPSY;  Surgeon: [name], MD;  Location: [facility];  Service: General;  Laterality: Left;   CESAREAN SECTION     X2   EYE SURGERY Bilateral 2019   cataract surgery   RADIOACTIVE SEED GUIDED AXILLARY SENTINEL LYMPH NODE Left 09/21/2021   Procedure: RADIOACTIVE SEED GUIDED AXILLARY SENTINEL LYMPH NODE BIOPSY;  Surgeon: [name], MD;  Location: [facility];  Service: General;  Laterality: Left;     ALLERGIES:  No Known Allergies   CURRENT MEDICATIONS:  Outpatient Encounter Medications as of 03/11/2022  Medication Sig Note   anastrozole (ARIMIDEX) 1 MG tablet Take 1 tablet (1 mg total) by mouth daily.    aspirin EC 81 MG tablet Take 81 mg by mouth at bedtime.    gabapentin (NEURONTIN) 300 MG capsule Take 300 mg by mouth daily at 12 noon.    glipiZIDE (GLUCOTROL) 10 MG tablet Take 10 mg by mouth daily before breakfast.    hydrochlorothiazide (HYDRODIURIL) 25 MG tablet Take 25 mg by mouth in the morning.    ibuprofen (ADVIL,MOTRIN) 800 MG tablet Take 1,600 mg by mouth every 8 (eight) hours as needed for moderate pain.    insulin glargine (LANTUS)  100 UNIT/ML injection Inject 35 Units into the skin 2 (two) times daily. 09/21/2021: 17 units last night 17 units this morning    lisinopril (ZESTRIL) 10 MG tablet Take 10 mg by mouth in the morning.    LUMIGAN 0.01 % SOLN Place 1 drop into both eyes at bedtime.     SitaGLIPtin-MetFORMIN HCl (JANUMET XR) 50-1000 MG TB24 Take 1 tablet by mouth in the morning and at bedtime.    [DISCONTINUED] oxyCODONE (OXY IR/ROXICODONE) 5 MG immediate release tablet Take 1 tablet (5 mg total) by mouth every 6 (six) hours as needed for severe pain. (Patient not taking: Reported on 03/11/2022)    No facility-administered encounter medications on file as of 03/11/2022.     ONCOLOGIC FAMILY HISTORY:   Family History  Problem Relation Age of Onset   Heart disease Mother    Bone cancer Father    Breast cancer Sister        dx. 40s   Stomach cancer Sister        dx. 54s, unsure if second primary or metastasis   Breast cancer Niece     SOCIAL HISTORY:  Social History   Socioeconomic History   Marital status: Divorced    Spouse name: Not on file   Number of children: 2   Years of education: Not on file   Highest education level: Some college, no degree  Occupational History   Not on file  Tobacco Use   Smoking status: Former    Types: Cigarettes   Smokeless tobacco: Never  Vaping Use   Vaping Use: Never used  Substance and Sexual Activity   Alcohol use: No   Drug use: No   Sexual activity: Not Currently  Other Topics Concern   Not on file  Social History Narrative   Not on file   Social Determinants of Health   Financial Resource Strain: Not on file  Food Insecurity: No Food Insecurity (09/01/2021)   Hunger Vital Sign    Worried About Running Out of Food in the Last Year: Never true    Ran Out of Food in the Last Year: Never true  Transportation Needs: No Transportation Needs (09/01/2021)   PRAPARE - Hydrologist (Medical): No    Lack of Transportation (Non-Medical): No  Physical Activity: Not on file  Stress: Not on file  Social Connections: Not on file  Intimate Partner Violence: Not on file     OBSERVATIONS/OBJECTIVE:  BP (!) 151/76 (BP Location: Left Arm, Patient Position: Sitting)   Pulse 78   Temp (!) 97.5 F (36.4 C) (Temporal)   Resp 16   Wt 196 lb 6.4 oz (89.1 kg)   SpO2 96%   BMI 39.67 kg/m  GENERAL: Patient is a well appearing female in no acute distress HEENT:  Sclerae anicteric.  Oropharynx clear and moist. No ulcerations or evidence of oropharyngeal candidiasis. Neck is supple.  NODES:  No cervical, supraclavicular, or axillary lymphadenopathy palpated.  BREAST EXAM:  left breast s/p lumpectomy and radiation,  nos ign of local recurrence right breast benign LUNGS:  Clear to auscultation bilaterally.  No wheezes or rhonchi. HEART:  Regular rate and rhythm. No murmur appreciated. ABDOMEN:  Soft, nontender.  Positive, normoactive bowel sounds. No organomegaly palpated. MSK:  No focal spinal tenderness to palpation. Full range of motion bilaterally in the upper extremities. EXTREMITIES:  No peripheral edema.   SKIN:  Clear with no obvious rashes or skin changes. No nail dyscrasia. NEURO:  Nonfocal. Well oriented.  Appropriate affect.   LABORATORY DATA:  None for this visit.  DIAGNOSTIC IMAGING:  None for this visit.      ASSESSMENT AND PLAN:  Kelli Vaughn is a pleasant 66 y.o. female with Stage 1A left breast invasive ductal carcinoma, ER+/PR+/HER2-, diagnosed in 08/2021, treated with lumpectomy, adjuvant radiation therapy, and anti-estrogen therapy with Anastrozole beginning in 12/2021.  She presents to the Survivorship Clinic for our initial meeting and routine follow-up post-completion of treatment for breast cancer.    1. Stage IA left breast cancer:  Kelli Vaughn is continuing to recover from definitive treatment for breast cancer. She will follow-up with her medical oncologist, Dr. Chryl Heck in 6 months with history and physical exam per surveillance protocol.  She will continue her anti-estrogen therapy with Letrozole. Thus far, she is tolerating the Letrozole well, with minimal side effects. She was instructed to make Dr. Lindi Adie or myself aware if she begins to experience any worsening side effects of the medication and I could see her back in clinic to help manage those side effects, as needed. Her mammogram is due 06/2022; orders placed today.  Today, a comprehensive survivorship care plan and treatment summary was reviewed with the patient today detailing her breast cancer diagnosis, treatment course, potential late/long-term effects of treatment, appropriate follow-up care with recommendations for the  future, and patient education resources.  A copy of this summary, along with a letter will be sent to the patient's primary care provider via mail/fax/In Basket message after today's visit.    2. Fatigue: She has struggled significantly with this.  We will check some labs today to evaluate further and she may benefit from further follow-up with her PCP.  3. Right arm pain: I have ordered an x-ray of the right humerus to further evaluate.  Considering this is not the side that she had surgery on it is a little strange.  Recommended that she take ibuprofen as needed and if the pain remains persistent with an unclear etiology on x-ray I will place a referral to sports medicine.  4. Skin lesions: These appear worrisome for a potential low-grade skin cancer I have asked Dr. Barry Dienes if she be willing to do a couple of skin biopsies when she sees Claudia in follow-up next month since the wait time for dermatology new patients is approximately 6 months.  5. Bone health:  Given Kelli Vaughn age/history of breast cancer and her current treatment regimen including anti-estrogen therapy with Letrozole, she is at risk for bone demineralization.  She has not yet undergone bone density testing and I placed orders for this to be completed today.  She was given education on specific activities to promote bone health.  6. Cancer screening:  Due to Kelli Vaughn's history and her age, she should receive screening for skin cancers, colon cancer, and gynecologic cancers.  The information and recommendations are listed on the patient's comprehensive care plan/treatment summary and were reviewed in detail with the patient.    7. Health maintenance and wellness promotion: Kelli Vaughn was encouraged to consume 5-7 servings of fruits and vegetables per day. We reviewed the "Nutrition Rainbow" handout.  She was also encouraged to engage in moderate to vigorous exercise for 30 minutes per day most days of the week. We discussed the [employer]  [employer] fitness program, which is designed for cancer survivors to help them become more physically fit after cancer treatments.  She was instructed to limit her alcohol consumption and continue to abstain from  tobacco use.     8. Support services/counseling: It is not uncommon for this period of the patient's cancer care trajectory to be one of many emotions and stressors.  She was given information regarding our available services and encouraged to contact me with any questions or for help enrolling in any of our support group/programs.    Follow up instructions:    -Return to cancer center in 6 months for f/u with Dr. Chryl Heck -Mammogram due in December 2023 -Follow up with surgery 1 month -She is welcome to return back to the Survivorship Clinic at any time; no additional follow-up needed at this time.  -Consider referral back to survivorship as a long-term survivor for continued surveillance  The patient was provided an opportunity to ask questions and all were answered. The patient agreed with the plan and demonstrated an understanding of the instructions.   Total encounter time:50 minutes*in face-to-face visit time, chart review, lab review, care coordination, order entry, and documentation of the encounter time.    Wilber Bihari, NP 03/11/22 9:55 AM Medical Oncology and Hematology [facility] [place],  [address] Tel. [phone]    Fax. [phone]  *Total Encounter Time as defined by the Centers for Medicare and Medicaid Services includes, in addition to the face-to-face time of a patient visit (documented in the note above) non-face-to-face time: obtaining and reviewing outside history, ordering and reviewing medications, tests or procedures, care coordination (communications with other health care professionals or caregivers) and documentation in the medical record.

## 2022-03-15 ENCOUNTER — Telehealth: Payer: Self-pay

## 2022-03-15 ENCOUNTER — Ambulatory Visit: Payer: No Typology Code available for payment source | Attending: Hematology and Oncology

## 2022-03-15 DIAGNOSIS — Z170 Estrogen receptor positive status [ER+]: Secondary | ICD-10-CM | POA: Insufficient documentation

## 2022-03-15 DIAGNOSIS — C50212 Malignant neoplasm of upper-inner quadrant of left female breast: Secondary | ICD-10-CM | POA: Insufficient documentation

## 2022-03-15 DIAGNOSIS — R600 Localized edema: Secondary | ICD-10-CM | POA: Insufficient documentation

## 2022-03-15 DIAGNOSIS — M25612 Stiffness of left shoulder, not elsewhere classified: Secondary | ICD-10-CM | POA: Insufficient documentation

## 2022-03-15 DIAGNOSIS — M6281 Muscle weakness (generalized): Secondary | ICD-10-CM | POA: Insufficient documentation

## 2022-03-15 DIAGNOSIS — Z483 Aftercare following surgery for neoplasm: Secondary | ICD-10-CM | POA: Insufficient documentation

## 2022-03-15 DIAGNOSIS — R293 Abnormal posture: Secondary | ICD-10-CM | POA: Insufficient documentation

## 2022-03-15 NOTE — Telephone Encounter (Signed)
Attempted to call pt and her son, as well as our interpreter to discuss results and recommendation. Pt, don or interpreter did not answer. LVM for pt to return call.

## 2022-03-15 NOTE — Therapy (Signed)
OUTPATIENT PHYSICAL THERAPY ONCOLOGY TREATMENT  Patient Name: Kelli Vaughn MRN: [id number] DOB:[birth year], 66 y.o., female Today's Date: 03/15/2022   PT End of Session - 03/15/22 1008     Visit Number 4    Number of Visits 9    Date for PT Re-Evaluation 03/30/22    PT Start Time 1000    PT Stop Time 1105    PT Time Calculation (min) 65 min    Activity Tolerance Patient tolerated treatment well    Behavior During Therapy WFL for tasks assessed/performed              Past Medical History:  Diagnosis Date   Complication of anesthesia    Diabetes mellitus without complication ([place])    TYPE II   Glaucoma    Hypertension    PONV (postoperative nausea and vomiting)    Past Surgical History:  Procedure Laterality Date   BREAST LUMPECTOMY WITH RADIOACTIVE SEED AND SENTINEL LYMPH NODE BIOPSY Left 09/21/2021   Procedure: LEFT BREAST LUMPECTOMY WITH RADIOACTIVE SEED X2 AND SENTINEL LYMPH NODE BIOPSY;  Surgeon: [name], MD;  Location: [facility];  Service: General;  Laterality: Left;   CESAREAN SECTION     X2   EYE SURGERY Bilateral 2019   cataract surgery   RADIOACTIVE SEED GUIDED AXILLARY SENTINEL LYMPH NODE Left 09/21/2021   Procedure: RADIOACTIVE SEED GUIDED AXILLARY SENTINEL LYMPH NODE BIOPSY;  Surgeon: [name], MD;  Location: [facility];  Service: General;  Laterality: Left;   Patient Active Problem List   Diagnosis Date Noted   Genetic testing 09/17/2021   Family history of breast cancer 09/01/2021   Malignant neoplasm of upper-inner quadrant of left breast in female, estrogen receptor positive ([place]) 08/31/2021    PCP: [name], MD  REFERRING PROVIDER: Benay Pike   REFERRING DIAG: C50.212,Z17.0 (ICD-10-CM) - Malignant neoplasm of upper-inner quadrant of left breast in female, estrogen receptor positive ([place])  THERAPY DIAG:  Aftercare following surgery for neoplasm  Localized edema  Stiffness of left shoulder, not elsewhere  classified  Muscle weakness (generalized)  Abnormal posture  Malignant neoplasm of upper-inner quadrant of left breast in female, estrogen receptor positive ([place])  ONSET DATE: 09/21/21  Rationale for Evaluation and Treatment Rehabilitation  SUBJECTIVE                                                                                                                                                                                           SUBJECTIVE STATEMENT: I lost the foam you gave me to wear in my bra but I was wearing it. I can reach higher now because my Lt armpit  is feeling better.   PERTINENT HISTORY:  Patient was diagnosed on 05/20/2021 with left grade II invasive ductal carcinoma breast cancer. It measures 3.7 cm and is located in the upper inner quadrant. It is ER/PR positive and HER 2 negative with a Ki67 of 5%. 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 Surgery for left lumpectomy with SLNB was performed on 09/21/2021 with 1/5 LN's. Pt had a post op seroma  PAIN:  Are you having pain? No pain  PRECAUTIONS: Other: DM, hypertension, L UE lymphedema risk  WEIGHT BEARING RESTRICTIONS No  FALLS:  Has patient fallen in last 6 months? 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  HAND DOMINANCE : right   PRIOR LEVEL OF FUNCTION: Independent  PATIENT GOALS to feel better, less pain in breast   OBJECTIVE  COGNITION:  Overall cognitive status: Within functional limits for tasks assessed   PALPATION: Increased fibrosis surrounding lumpectomy scar  OBSERVATIONS / OTHER ASSESSMENTS: discoloration in inferior breast  POSTURE: forward head and rounded shoulders  UPPER  EXTREMITY AROM/PROM:  A/PROM RIGHT   eval   Shoulder extension 65  Shoulder flexion 172  Shoulder abduction 168  Shoulder internal rotation 38  Shoulder external rotation 90    (Blank rows = not tested)  A/PROM LEFT   eval  Shoulder extension 57  Shoulder flexion 165  Shoulder abduction 150  Shoulder internal rotation 65  Shoulder external rotation 90    (Blank rows = not tested)    LYMPHEDEMA ASSESSMENTS:   SURGERY TYPE/DATE: left lumpectomy with SLNB 09/21/21  NUMBER OF LYMPH NODES REMOVED: 1/5  CHEMOTHERAPY: none  RADIATION:completed  HORMONE TREATMENT: currently on anastrozole  INFECTIONS: none  LYMPHEDEMA ASSESSMENTS:   LANDMARK RIGHT  eval  10 cm proximal to olecranon process 30.5  Olecranon process 27  10 cm proximal to ulnar styloid process 23  Just proximal to ulnar styloid process 18  Across hand at thumb web space 19.7  At base of 2nd digit 6.3  (Blank rows = not tested)  LANDMARK LEFT  eval  10 cm proximal to olecranon process 34.5  Olecranon process 27  10 cm proximal to ulnar styloid process 21.7  Just proximal to ulnar styloid process 17.4  Across hand at thumb web space 18.6  At base of 2nd digit 6  (Blank rows = not tested)   BREAST COMPLAINTS SCALE: 27   TODAY'S TREATMENT  03/15/22: Manual Therapy MLD: In Supine with pt permission granted first: Short neck, 5 diaphragmatic breaths, Lt inguinal and Rt axillary nodes, then Lt axillo-inguinal and anterior inter-axillary anastomosis, then focused on medial aspect of Lt breast where scar tissue and some fibrosis palpated; this seemed some improved today from when this therapist saw her last.  P/ROM to Lt shoulder throughout MLD into flexion, abd and ER with MFR to Lt axilla during P/ROM where pt palpably tight with scar tissue and reports mild tender to touch today, this conts to soften well by end of session, was not able to palpate a cord today Cut another piece of peach Medi foam with  small dots and placed this against 1/4" gray foam and placed in thin stockinette for pt to wear at medial aspect of breast  03/10/22- Manual Therapy MLD: In Supine with pt permission granted first: Short neck, 5 diaphragmatic breaths, Lt inguinal and Rt axillary nodes, then Lt axillo-inguinal and anterior inter-axillary anastomosis, then focused on medial aspect of Lt breast where scar tissue and some fibrosis palpated.  P/ROM to Lt shoulder throughout MLD into flexion, abd and ER with MFR to LT axilla during P/ROM where pt palpably very tight with scar tissue and reports mod tender to touch, this softened well by end of session, also one cord palpable in this area so focused on it as well  03/08/22- SOZO redone- change from baseline increased from 0.5 on 7/17 to 6.1 today Manual Therapy MLD: In Supine with pt permission granted first: Short neck, 5 diaphragmatic breaths, Lt inguinal and Rt axillary nodes, then Lt axillo-inguinal and anterior inter-axillary anastomosis, then focused on medial aspect of Lt breast where scar tissue and some fibrosis palpated.  P/ROM to Lt shoulder throughout MLD into flexion, abd and D2 with scapular depression throughout MFR to LT axilla during P/ROM where pt palpably very tight with scar tissue and reports mod tender to touch, this softened well by end of session Scapular Mobs in Rt S/L into protraction and retraction with STM to medial scap border Edema Management: Issued peach medi foam with small dots in thin stockinette for pt to wear at her medial breast at incision in her compression bra    PATIENT EDUCATION:  Education details: importance of a walking program to decrease cancer related fatigue Person educated: Patient Education method: Explanation Education comprehension: verbalized understanding   HOME EXERCISE PROGRAM: Walking program  ASSESSMENT:  CLINICAL IMPRESSION: Pt reports tenderness at her Lt axilla feels much less tender now and she can  reach higher than before. Her fibrosis at the medial aspect of her Lt breast also feels softer than it has been. Answered pts questions about her lymphatic system and how redirecting fluid to neighboring, healthy quadrants is beneficial to decreasing current swelling, with help of the interpreter today. Pt reports feeling pleased with progress she has made thus far.    OBJECTIVE IMPAIRMENTS decreased knowledge of condition, decreased ROM, increased edema, increased fascial restrictions, postural dysfunction, and pain.   ACTIVITY LIMITATIONS  none reported   PARTICIPATION LIMITATIONS:  pt just limited in daily activities due to need for recovery periods due to [place] Time since onset of injury/illness/exacerbation are also affecting patient's functional outcome.   REHAB POTENTIAL: Good  CLINICAL DECISION MAKING: Stable/uncomplicated  EVALUATION COMPLEXITY: Low  GOALS: Goals reviewed with patient? Yes  SHORT TERM GOALS=LONG TERM GOALS  Target date: 03/30/2022    Pt will demonstrate 168 degrees of L shoulder abduction to allow her to reach out to the side.  Baseline: Goal status: INITIAL  2.  Pt will report a 75% improvement in pain and discomfort in L breast to allow improved comfort.  Baseline:  Goal status: INITIAL  3.  Pt will be independent in Strength ABC program for continued stretching and strengthening at home.  Baseline:  Goal status: INITIAL  4.  Pt will be independent in scar mobilization to decrease discomfort in L breast.  Baseline:  Goal status: INITIAL    PLAN: PT FREQUENCY: 2x/week  PT DURATION: 4 weeks  PLANNED INTERVENTIONS: Therapeutic exercises, Therapeutic activity, Patient/Family education, Self Care, Joint mobilization, Manual lymph drainage, scar mobilization, Taping, and Manual therapy  PLAN FOR NEXT SESSION: MFR to tightness Lt axilla, scar mobilization to lumpectomy scar, PROM to L shoulder, MLD to L  breast, try pulleys and  ball roll up wall; eventually teach Strength ABC   Otelia Limes, PTA 03/15/2022, 12:17 PM

## 2022-03-17 ENCOUNTER — Ambulatory Visit: Payer: No Typology Code available for payment source

## 2022-03-17 ENCOUNTER — Telehealth: Payer: Self-pay | Admitting: *Deleted

## 2022-03-17 ENCOUNTER — Other Ambulatory Visit: Payer: Self-pay | Admitting: *Deleted

## 2022-03-17 DIAGNOSIS — E559 Vitamin D deficiency, unspecified: Secondary | ICD-10-CM

## 2022-03-17 MED ORDER — VITAMIN D (ERGOCALCIFEROL) 1.25 MG (50000 UNIT) PO CAPS: 50000.0000 [IU] | ORAL_CAPSULE | ORAL | 0 refills | Status: AC

## 2022-03-17 NOTE — Telephone Encounter (Addendum)
-----   Message from Gardenia Phlegm, NP sent at 03/12/2022  1:00 PM EDT ----- Patient has vitamin d deficiency and b12 deficiency.  I recommend vit d 50,000 units weekly disp 12, no refills, and b12 OTC 1061mg sublingual daily.  Recheck vit d and b12 in 12 weeks.    Patient contacted with assistance of [employer]pre[employer].  Patient given information that her Vitamin D level is low. Vitamin D is important for building and maintaining strong bones. Vitamin D 50,000 po once per week ordered per LClovis Riley NP. Informed patient prescription sent to her pharmacy.    Patient informed that her Vitamin B12 level is [place]. Vitamin B 12 is important for blood cell production and nerve cell health. Patient should purchase Vitamin B12 1000 micrograms, sublingual/under the tongue and take 1 tablet every day. This can be purchased at the pharmacy without a prescription.  Patient verbalized to JAlmyra Freethat she understood all the information provided.   Patient is to have a lab appt with labs repeated in 12 weeks. Patient asked to have appt info sent via [facility] Schedule message sent.

## 2022-03-22 ENCOUNTER — Ambulatory Visit: Payer: No Typology Code available for payment source | Admitting: Rehabilitation

## 2022-03-22 ENCOUNTER — Encounter: Payer: Self-pay | Admitting: Rehabilitation

## 2022-03-22 DIAGNOSIS — M6281 Muscle weakness (generalized): Secondary | ICD-10-CM

## 2022-03-22 DIAGNOSIS — R600 Localized edema: Secondary | ICD-10-CM

## 2022-03-22 DIAGNOSIS — R293 Abnormal posture: Secondary | ICD-10-CM

## 2022-03-22 DIAGNOSIS — Z483 Aftercare following surgery for neoplasm: Secondary | ICD-10-CM

## 2022-03-22 DIAGNOSIS — M25612 Stiffness of left shoulder, not elsewhere classified: Secondary | ICD-10-CM

## 2022-03-22 DIAGNOSIS — C50212 Malignant neoplasm of upper-inner quadrant of left female breast: Secondary | ICD-10-CM

## 2022-03-22 NOTE — Therapy (Signed)
OUTPATIENT PHYSICAL THERAPY ONCOLOGY TREATMENT  Patient Name: Kelli Vaughn MRN: [id number] DOB:[birth year], 66 y.o., female Today's Date: 03/22/2022   PT End of Session - 03/22/22 1004     Visit Number 5    Number of Visits 9    Date for PT Re-Evaluation 03/30/22    PT Start Time 1005    PT Stop Time 1100    PT Time Calculation (min) 55 min    Activity Tolerance Patient tolerated treatment well    Behavior During Therapy WFL for tasks assessed/performed              Past Medical History:  Diagnosis Date   Complication of anesthesia    Diabetes mellitus without complication ([place])    TYPE II   Glaucoma    Hypertension    PONV (postoperative nausea and vomiting)    Past Surgical History:  Procedure Laterality Date   BREAST LUMPECTOMY WITH RADIOACTIVE SEED AND SENTINEL LYMPH NODE BIOPSY Left 09/21/2021   Procedure: LEFT BREAST LUMPECTOMY WITH RADIOACTIVE SEED X2 AND SENTINEL LYMPH NODE BIOPSY;  Surgeon: [name], MD;  Location: [facility];  Service: General;  Laterality: Left;   CESAREAN SECTION     X2   EYE SURGERY Bilateral 2019   cataract surgery   RADIOACTIVE SEED GUIDED AXILLARY SENTINEL LYMPH NODE Left 09/21/2021   Procedure: RADIOACTIVE SEED GUIDED AXILLARY SENTINEL LYMPH NODE BIOPSY;  Surgeon: [name], MD;  Location: [facility];  Service: General;  Laterality: Left;   Patient Active Problem List   Diagnosis Date Noted   Genetic testing 09/17/2021   Family history of breast cancer 09/01/2021   Malignant neoplasm of upper-inner quadrant of left breast in female, estrogen receptor positive ([place]) 08/31/2021    PCP: [name], MD  REFERRING PROVIDER: Benay Pike   REFERRING DIAG: C50.212,Z17.0 (ICD-10-CM) - Malignant neoplasm of upper-inner quadrant of left breast in female, estrogen receptor positive ([place])  THERAPY DIAG:  Aftercare following surgery for neoplasm  Localized edema  Stiffness of left shoulder, not elsewhere  classified  Muscle weakness (generalized)  Abnormal posture  Malignant neoplasm of upper-inner quadrant of left breast in female, estrogen receptor positive ([place])  ONSET DATE: 09/21/21  Rationale for Evaluation and Treatment Rehabilitation  SUBJECTIVE                                                                                                                                                                                        SUBJECTIVE STATEMENT: I am doing well.    PERTINENT HISTORY:  Patient was diagnosed on 05/20/2021 with left grade II invasive ductal carcinoma breast cancer. It measures 3.7 cm and  is located in the upper inner quadrant. It is ER/PR positive and HER 2 negative with a Ki67 of 5%. 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 Surgery for left lumpectomy with SLNB was performed on 09/21/2021 with 1/5 LN's. Pt had a post op seroma  PAIN:  Are you having pain? No pain  PRECAUTIONS: Other: DM, hypertension, L UE lymphedema risk  WEIGHT BEARING RESTRICTIONS No  FALLS:  Has patient fallen in last 6 months? 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  HAND DOMINANCE : right   PRIOR LEVEL OF FUNCTION: Independent  PATIENT GOALS to feel better, less pain in breast   OBJECTIVE COGNITION:  Overall cognitive status: Within functional limits for tasks assessed   PALPATION: Increased fibrosis surrounding lumpectomy scar  OBSERVATIONS / OTHER ASSESSMENTS: discoloration in inferior breast  POSTURE: forward head and rounded shoulders  UPPER EXTREMITY AROM/PROM:  A/PROM RIGHT   eval   Shoulder extension 65  Shoulder flexion 172  Shoulder abduction 168   Shoulder internal rotation 38  Shoulder external rotation 90    (Blank rows = not tested)  A/PROM LEFT   eval  Shoulder extension 57  Shoulder flexion 165  Shoulder abduction 150  Shoulder internal rotation 65  Shoulder external rotation 90    (Blank rows = not tested)   LYMPHEDEMA ASSESSMENTS:  SURGERY TYPE/DATE: left lumpectomy with SLNB 09/21/21 NUMBER OF LYMPH NODES REMOVED: 1/5 CHEMOTHERAPY: none RADIATION:completed HORMONE TREATMENT: currently on anastrozole INFECTIONS: none  LYMPHEDEMA ASSESSMENTS:   LANDMARK RIGHT  eval  10 cm proximal to olecranon process 30.5  Olecranon process 27  10 cm proximal to ulnar styloid process 23  Just proximal to ulnar styloid process 18  Across hand at thumb web space 19.7  At base of 2nd digit 6.3  (Blank rows = not tested)  LANDMARK LEFT  eval  10 cm proximal to olecranon process 34.5  Olecranon process 27  10 cm proximal to ulnar styloid process 21.7  Just proximal to ulnar styloid process 17.4  Across hand at thumb web space 18.6  At base of 2nd digit 6  (Blank rows = not tested)   BREAST COMPLAINTS SCALE: 27   TODAY'S TREATMENT  03/22/22: Pulleys 36min into flexion and abduction (both easy) Manual Therapy STM/MFR to Left axilla in overhead position propped on pillow - some cording noted again today MLD: In Supine with pt permission granted first: Short neck, 5 diaphragmatic breaths, Lt inguinal and Rt axillary nodes, then Lt axillo-inguinal and anterior inter-axillary anastomosis, then focused on medial aspect of Lt breast where scar tissue and some fibrosis palpated P/ROM to Lt shoulder flexion, abd and ER with MFR to Lt axilla during P/ROM where pt palpably tight with scar tissue and reports mild tender to touch  03/15/22: Manual Therapy MLD: In Supine with pt permission granted first: Short neck, 5 diaphragmatic breaths, Lt inguinal and Rt axillary nodes, then Lt axillo-inguinal and anterior inter-axillary  anastomosis, then focused on medial aspect of Lt breast where scar tissue and some fibrosis palpated; this seemed some improved today from when this therapist saw her last.  P/ROM to Lt shoulder throughout MLD into flexion, abd and ER with MFR to Lt axilla during P/ROM where  pt palpably tight with scar tissue and reports mild tender to touch today, this conts to soften well by end of session, was not able to palpate a cord today Cut another piece of peach Medi foam with small dots and placed this against 1/4" gray foam and placed in thin stockinette for pt to wear at medial aspect of breast  03/10/22- Manual Therapy MLD: In Supine with pt permission granted first: Short neck, 5 diaphragmatic breaths, Lt inguinal and Rt axillary nodes, then Lt axillo-inguinal and anterior inter-axillary anastomosis, then focused on medial aspect of Lt breast where scar tissue and some fibrosis palpated.  P/ROM to Lt shoulder throughout MLD into flexion, abd and ER with MFR to LT axilla during P/ROM where pt palpably very tight with scar tissue and reports mod tender to touch, this softened well by end of session, also one cord palpable in this area so focused on it as well  PATIENT EDUCATION:  Education details: importance of a walking program to decrease cancer related fatigue Person educated: Patient Education method: Explanation Education comprehension: verbalized understanding   HOME EXERCISE PROGRAM: Walking program  ASSESSMENT: CLINICAL IMPRESSION: Pt felt a bit more puffy due to missing an appt this past week.  Continued POC with excellent softening and pt reporting improvements post MT.   OBJECTIVE IMPAIRMENTS decreased knowledge of condition, decreased ROM, increased edema, increased fascial restrictions, postural dysfunction, and pain.   ACTIVITY LIMITATIONS  none reported   PARTICIPATION LIMITATIONS:  pt just limited in daily activities due to need for recovery periods due to [place] Time since onset of injury/illness/exacerbation are also affecting patient's functional outcome.   REHAB POTENTIAL: Good  CLINICAL DECISION MAKING: Stable/uncomplicated  EVALUATION COMPLEXITY: Low  GOALS: Goals reviewed with patient? Yes  SHORT TERM GOALS=LONG TERM GOALS  Target date: 03/30/2022    Pt will demonstrate 168 degrees of L shoulder abduction to allow her to reach out to the side.  Baseline: Goal status: INITIAL  2.  Pt will report a 75% improvement in pain and discomfort in L breast to allow improved comfort.  Baseline:  Goal status: INITIAL  3.  Pt will be independent in Strength ABC program for continued stretching and strengthening at home.  Baseline:  Goal status: INITIAL  4.  Pt will be independent in scar mobilization to decrease discomfort in L breast.  Baseline:  Goal status: INITIAL    PLAN: PT FREQUENCY: 2x/week  PT DURATION: 4 weeks  PLANNED INTERVENTIONS: Therapeutic exercises, Therapeutic activity, Patient/Family education, Self Care, Joint mobilization, Manual lymph drainage, scar mobilization, Taping, and Manual therapy  PLAN FOR NEXT SESSION: MFR to tightness Lt axilla, scar mobilization to lumpectomy scar, PROM to L shoulder, MLD to L breast, eventually teach Strength ABC   Stark Bray, PT 03/22/2022, 1:01 PM

## 2022-03-24 ENCOUNTER — Encounter: Payer: Self-pay | Admitting: Rehabilitation

## 2022-03-24 ENCOUNTER — Ambulatory Visit: Payer: No Typology Code available for payment source | Admitting: Rehabilitation

## 2022-03-24 DIAGNOSIS — Z483 Aftercare following surgery for neoplasm: Secondary | ICD-10-CM

## 2022-03-24 DIAGNOSIS — M25612 Stiffness of left shoulder, not elsewhere classified: Secondary | ICD-10-CM

## 2022-03-24 DIAGNOSIS — R600 Localized edema: Secondary | ICD-10-CM

## 2022-03-24 DIAGNOSIS — C50212 Malignant neoplasm of upper-inner quadrant of left female breast: Secondary | ICD-10-CM

## 2022-03-24 DIAGNOSIS — R293 Abnormal posture: Secondary | ICD-10-CM

## 2022-03-24 DIAGNOSIS — M6281 Muscle weakness (generalized): Secondary | ICD-10-CM

## 2022-03-24 NOTE — Therapy (Signed)
OUTPATIENT PHYSICAL THERAPY ONCOLOGY TREATMENT  Patient Name: Kelli Vaughn MRN: [id number] DOB:[birth year], 66 y.o., female Today's Date: 03/24/2022   PT End of Session - 03/24/22 1008     Visit Number 6    Number of Visits 9    PT Start Time 1010    PT Stop Time 1051    PT Time Calculation (min) 41 min    Activity Tolerance Patient tolerated treatment well    Behavior During Therapy WFL for tasks assessed/performed              Past Medical History:  Diagnosis Date   Complication of anesthesia    Diabetes mellitus without complication ([place])    TYPE II   Glaucoma    Hypertension    PONV (postoperative nausea and vomiting)    Past Surgical History:  Procedure Laterality Date   BREAST LUMPECTOMY WITH RADIOACTIVE SEED AND SENTINEL LYMPH NODE BIOPSY Left 09/21/2021   Procedure: LEFT BREAST LUMPECTOMY WITH RADIOACTIVE SEED X2 AND SENTINEL LYMPH NODE BIOPSY;  Surgeon: [name], MD;  Location: [place];  Service: General;  Laterality: Left;   CESAREAN SECTION     X2   EYE SURGERY Bilateral 2019   cataract surgery   RADIOACTIVE SEED GUIDED AXILLARY SENTINEL LYMPH NODE Left 09/21/2021   Procedure: RADIOACTIVE SEED GUIDED AXILLARY SENTINEL LYMPH NODE BIOPSY;  Surgeon: [name], MD;  Location: [place];  Service: General;  Laterality: Left;   Patient Active Problem List   Diagnosis Date Noted   Genetic testing 09/17/2021   Family history of breast cancer 09/01/2021   Malignant neoplasm of upper-inner quadrant of left breast in female, estrogen receptor positive ([place]) 08/31/2021    PCP: [name], MD  REFERRING PROVIDER: Benay Pike   REFERRING DIAG: C50.212,Z17.0 (ICD-10-CM) - Malignant neoplasm of upper-inner quadrant of left breast in female, estrogen receptor positive ([place])  THERAPY DIAG:  Aftercare following surgery for neoplasm  Localized edema  Stiffness of left shoulder, not elsewhere classified  Muscle weakness (generalized)  Abnormal  posture  Malignant neoplasm of upper-inner quadrant of left breast in female, estrogen receptor positive ([place])  ONSET DATE: 09/21/21  Rationale for Evaluation and Treatment Rehabilitation  SUBJECTIVE                                                                                                                                                                                        SUBJECTIVE STATEMENT:  I am feeling better   PERTINENT HISTORY:  Patient was diagnosed on 05/20/2021 with left grade II invasive ductal carcinoma breast cancer. It measures 3.7 cm and is located in the upper inner quadrant. It  is ER/PR positive and HER 2 negative with a Ki67 of 5%. 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 Surgery for left lumpectomy with SLNB was performed on 09/21/2021 with 1/5 LN's. Pt had a post op seroma  PAIN:  Are you having pain? No pain  PRECAUTIONS: Other: DM, hypertension, L UE lymphedema risk  WEIGHT BEARING RESTRICTIONS No  FALLS:  Has patient fallen in last 6 months? 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  HAND DOMINANCE : right   PRIOR LEVEL OF FUNCTION: Independent  PATIENT GOALS to feel better, less pain in breast   OBJECTIVE COGNITION:  Overall cognitive status: Within functional limits for tasks assessed   PALPATION: Increased fibrosis surrounding lumpectomy scar  OBSERVATIONS / OTHER ASSESSMENTS: discoloration in inferior breast  POSTURE: forward head and rounded shoulders  UPPER EXTREMITY AROM/PROM:  A/PROM RIGHT   eval   Shoulder extension 65  Shoulder flexion 172  Shoulder abduction 168  Shoulder internal rotation 38  Shoulder external  rotation 90    (Blank rows = not tested)  A/PROM LEFT   eval  Shoulder extension 57  Shoulder flexion 165  Shoulder abduction 150  Shoulder internal rotation 65  Shoulder external rotation 90    (Blank rows = not tested)   LYMPHEDEMA ASSESSMENTS:  SURGERY TYPE/DATE: left lumpectomy with SLNB 09/21/21 NUMBER OF LYMPH NODES REMOVED: 1/5 CHEMOTHERAPY: none RADIATION:completed HORMONE TREATMENT: currently on anastrozole INFECTIONS: none  LYMPHEDEMA ASSESSMENTS:   LANDMARK RIGHT  eval  10 cm proximal to olecranon process 30.5  Olecranon process 27  10 cm proximal to ulnar styloid process 23  Just proximal to ulnar styloid process 18  Across hand at thumb web space 19.7  At base of 2nd digit 6.3  (Blank rows = not tested)  LANDMARK LEFT  eval  10 cm proximal to olecranon process 34.5  Olecranon process 27  10 cm proximal to ulnar styloid process 21.7  Just proximal to ulnar styloid process 17.4  Across hand at thumb web space 18.6  At base of 2nd digit 6  (Blank rows = not tested)   BREAST COMPLAINTS SCALE: 27   TODAY'S TREATMENT  03/24/22: Manual Therapy STM/MFR to Left axilla in overhead position propped on pillow - some cording noted again today MLD: In Supine with pt permission granted first: Short neck, 5 diaphragmatic breaths, Lt inguinal and Rt axillary nodes, then Lt axillo-inguinal and anterior inter-axillary anastomosis, then focused on medial aspect of Lt breast where scar tissue and some fibrosis palpated P/ROM to Lt shoulder flexion, abd and ER with MFR to Lt axilla during P/ROM where pt palpably tight with scar tissue and reports mild tender to touch TE: dowel flexion 5" x 5, chest stretch 5x5", wall ball flexion 5" x 5   03/22/22: Pulleys 58mn into flexion and abduction (both easy) Manual Therapy STM/MFR to Left axilla in overhead position propped on pillow - some cording noted again today MLD: In Supine with pt permission granted first: Short neck, 5  diaphragmatic breaths, Lt inguinal and Rt axillary nodes, then Lt axillo-inguinal and anterior inter-axillary anastomosis, then focused on medial aspect of Lt breast where scar tissue and some fibrosis palpated P/ROM to Lt shoulder flexion, abd and ER  with MFR to Lt axilla during P/ROM where pt palpably tight with scar tissue and reports mild tender to touch  03/15/22: Manual Therapy MLD: In Supine with pt permission granted first: Short neck, 5 diaphragmatic breaths, Lt inguinal and Rt axillary nodes, then Lt axillo-inguinal and anterior inter-axillary anastomosis, then focused on medial aspect of Lt breast where scar tissue and some fibrosis palpated; this seemed some improved today from when this therapist saw her last.  P/ROM to Lt shoulder throughout MLD into flexion, abd and ER with MFR to Lt axilla during P/ROM where pt palpably tight with scar tissue and reports mild tender to touch today, this conts to soften well by end of session, was not able to palpate a cord today Cut another piece of peach Medi foam with small dots and placed this against 1/4" gray foam and placed in thin stockinette for pt to wear at medial aspect of breast  03/10/22- Manual Therapy MLD: In Supine with pt permission granted first: Short neck, 5 diaphragmatic breaths, Lt inguinal and Rt axillary nodes, then Lt axillo-inguinal and anterior inter-axillary anastomosis, then focused on medial aspect of Lt breast where scar tissue and some fibrosis palpated.  P/ROM to Lt shoulder throughout MLD into flexion, abd and ER with MFR to LT axilla during P/ROM where pt palpably very tight with scar tissue and reports mod tender to touch, this softened well by end of session, also one cord palpable in this area so focused on it as well  PATIENT EDUCATION:  Education details: importance of a walking program to decrease cancer related fatigue Person educated: Patient Education method: Explanation Education comprehension: verbalized  understanding   HOME EXERCISE PROGRAM: Walking program  ASSESSMENT: CLINICAL IMPRESSION: No cording noted today or visible.  Axilla feels much softer.  Continued POC with excellent softening and pt reporting improvements post MT.   OBJECTIVE IMPAIRMENTS decreased knowledge of condition, decreased ROM, increased edema, increased fascial restrictions, postural dysfunction, and pain.   ACTIVITY LIMITATIONS  none reported   PARTICIPATION LIMITATIONS:  pt just limited in daily activities due to need for recovery periods due to [place] Time since onset of injury/illness/exacerbation are also affecting patient's functional outcome.   REHAB POTENTIAL: Good  CLINICAL DECISION MAKING: Stable/uncomplicated  EVALUATION COMPLEXITY: Low  GOALS: Goals reviewed with patient? Yes  SHORT TERM GOALS=LONG TERM GOALS  Target date: 03/30/2022    Pt will demonstrate 168 degrees of L shoulder abduction to allow her to reach out to the side.  Baseline: Goal status: INITIAL  2.  Pt will report a 75% improvement in pain and discomfort in L breast to allow improved comfort.  Baseline:  Goal status: INITIAL  3.  Pt will be independent in Strength ABC program for continued stretching and strengthening at home.  Baseline:  Goal status: INITIAL  4.  Pt will be independent in scar mobilization to decrease discomfort in L breast.  Baseline:  Goal status: INITIAL    PLAN: PT FREQUENCY: 2x/week  PT DURATION: 4 weeks  PLANNED INTERVENTIONS: Therapeutic exercises, Therapeutic activity, Patient/Family education, Self Care, Joint mobilization, Manual lymph drainage, scar mobilization, Taping, and Manual therapy  PLAN FOR NEXT SESSION: MFR to tightness Lt axilla, scar mobilization to lumpectomy scar, PROM to L shoulder, MLD to L breast, eventually teach Strength ABC   Stark Bray, PT 03/24/2022, 10:52 AM

## 2022-03-29 ENCOUNTER — Encounter: Payer: Self-pay | Admitting: Physical Therapy

## 2022-03-29 ENCOUNTER — Ambulatory Visit: Payer: No Typology Code available for payment source | Admitting: Physical Therapy

## 2022-03-29 DIAGNOSIS — Z483 Aftercare following surgery for neoplasm: Secondary | ICD-10-CM

## 2022-03-29 DIAGNOSIS — M25612 Stiffness of left shoulder, not elsewhere classified: Secondary | ICD-10-CM

## 2022-03-29 DIAGNOSIS — M6281 Muscle weakness (generalized): Secondary | ICD-10-CM

## 2022-03-29 DIAGNOSIS — C50212 Malignant neoplasm of upper-inner quadrant of left female breast: Secondary | ICD-10-CM

## 2022-03-29 DIAGNOSIS — R293 Abnormal posture: Secondary | ICD-10-CM

## 2022-03-29 DIAGNOSIS — R600 Localized edema: Secondary | ICD-10-CM

## 2022-03-29 NOTE — Therapy (Signed)
OUTPATIENT PHYSICAL THERAPY ONCOLOGY TREATMENT  Patient Name: Kelli Vaughn MRN: [id number] DOB:[birth year], 66 y.o., female Today's Date: 03/29/2022   PT End of Session - 03/29/22 1003     Visit Number 7    Number of Visits 15    Date for PT Re-Evaluation 04/26/22    PT Start Time 1001    PT Stop Time 1048    PT Time Calculation (min) 47 min    Activity Tolerance Patient tolerated treatment well    Behavior During Therapy WFL for tasks assessed/performed              Past Medical History:  Diagnosis Date   Complication of anesthesia    Diabetes mellitus without complication ([place])    TYPE II   Glaucoma    Hypertension    PONV (postoperative nausea and vomiting)    Past Surgical History:  Procedure Laterality Date   BREAST LUMPECTOMY WITH RADIOACTIVE SEED AND SENTINEL LYMPH NODE BIOPSY Left 09/21/2021   Procedure: LEFT BREAST LUMPECTOMY WITH RADIOACTIVE SEED X2 AND SENTINEL LYMPH NODE BIOPSY;  Surgeon: [name], MD;  Location: [facility];  Service: General;  Laterality: Left;   CESAREAN SECTION     X2   EYE SURGERY Bilateral 2019   cataract surgery   RADIOACTIVE SEED GUIDED AXILLARY SENTINEL LYMPH NODE Left 09/21/2021   Procedure: RADIOACTIVE SEED GUIDED AXILLARY SENTINEL LYMPH NODE BIOPSY;  Surgeon: [name], MD;  Location: [place];  Service: General;  Laterality: Left;   Patient Active Problem List   Diagnosis Date Noted   Genetic testing 09/17/2021   Family history of breast cancer 09/01/2021   Malignant neoplasm of upper-inner quadrant of left breast in female, estrogen receptor positive ([place]) 08/31/2021    PCP: [name], MD  REFERRING PROVIDER: Benay Pike   REFERRING DIAG: C50.212,Z17.0 (ICD-10-CM) - Malignant neoplasm of upper-inner quadrant of left breast in female, estrogen receptor positive ([place])  THERAPY DIAG:  Aftercare following surgery for neoplasm  Localized edema  Stiffness of left shoulder, not elsewhere  classified  Muscle weakness (generalized)  Abnormal posture  Malignant neoplasm of upper-inner quadrant of left breast in female, estrogen receptor positive ([place])  ONSET DATE: 09/21/21  Rationale for Evaluation and Treatment Rehabilitation  SUBJECTIVE                                                                                                                                                                                        SUBJECTIVE STATEMENT: I still have a lot of hardness where the incision is.   PERTINENT HISTORY:  Patient was diagnosed on 05/20/2021 with left grade II invasive ductal carcinoma breast  cancer. It measures 3.7 cm and is located in the upper inner quadrant. It is ER/PR positive and HER 2 negative with a Ki67 of 5%. 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 Surgery for left lumpectomy with SLNB was performed on 09/21/2021 with 1/5 LN's. Pt had a post op seroma  PAIN:  Are you having pain? No pain  PRECAUTIONS: Other: DM, hypertension, L UE lymphedema risk  WEIGHT BEARING RESTRICTIONS No  FALLS:  Has patient fallen in last 6 months? 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  HAND DOMINANCE : right   PRIOR LEVEL OF FUNCTION: Independent  PATIENT GOALS to feel better, less pain in breast   OBJECTIVE COGNITION:  Overall cognitive status: Within functional limits for tasks assessed   PALPATION: Increased fibrosis surrounding lumpectomy scar  OBSERVATIONS / OTHER ASSESSMENTS: discoloration in inferior breast  POSTURE: forward head and rounded shoulders  UPPER EXTREMITY AROM/PROM:  A/PROM RIGHT   eval   Shoulder extension 65  Shoulder  flexion 172  Shoulder abduction 168  Shoulder internal rotation 38  Shoulder external rotation 90    (Blank rows = not tested)  A/PROM LEFT   eval  Shoulder extension 57  Shoulder flexion 165  Shoulder abduction 150  Shoulder internal rotation 65  Shoulder external rotation 90    (Blank rows = not tested)   LYMPHEDEMA ASSESSMENTS:  SURGERY TYPE/DATE: left lumpectomy with SLNB 09/21/21 NUMBER OF LYMPH NODES REMOVED: 1/5 CHEMOTHERAPY: none RADIATION:completed HORMONE TREATMENT: currently on anastrozole INFECTIONS: none  LYMPHEDEMA ASSESSMENTS:   LANDMARK RIGHT  eval  10 cm proximal to olecranon process 30.5  Olecranon process 27  10 cm proximal to ulnar styloid process 23  Just proximal to ulnar styloid process 18  Across hand at thumb web space 19.7  At base of 2nd digit 6.3  (Blank rows = not tested)  LANDMARK LEFT  eval  10 cm proximal to olecranon process 34.5  Olecranon process 27  10 cm proximal to ulnar styloid process 21.7  Just proximal to ulnar styloid process 17.4  Across hand at thumb web space 18.6  At base of 2nd digit 6  (Blank rows = not tested)   BREAST COMPLAINTS SCALE: 27   TODAY'S TREATMENT  03/29/22: Manual Therapy STM/MFR to Left axilla in overhead position propped on pillow - no cording noted today only tightness and fibrosis, increased time spent on lumpectomy scar to decrease fibrosis MLD: In Supine with pt permission granted first: Short neck, 5 diaphragmatic breaths, Lt inguinal and Rt axillary nodes, then Lt axillo-inguinal and anterior inter-axillary anastomosis, then focused on medial aspect of Lt breast where scar tissue and some fibrosis palpated P/ROM to Lt shoulder flexion, and abduction  03/24/22: Manual Therapy STM/MFR to Left axilla in overhead position propped on pillow - some cording noted again today MLD: In Supine with pt permission granted first: Short neck, 5 diaphragmatic breaths, Lt inguinal and Rt axillary nodes,  then Lt axillo-inguinal and anterior inter-axillary anastomosis, then focused on medial aspect of Lt breast where scar tissue and some fibrosis palpated P/ROM to Lt shoulder flexion, abd and ER with MFR to Lt axilla during P/ROM where pt palpably tight with scar tissue and reports mild tender to  touch TE: dowel flexion 5" x 5, chest stretch 5x5", wall ball flexion 5" x 5   03/22/22: Pulleys 80mn into flexion and abduction (both easy) Manual Therapy STM/MFR to Left axilla in overhead position propped on pillow - some cording noted again today MLD: In Supine with pt permission granted first: Short neck, 5 diaphragmatic breaths, Lt inguinal and Rt axillary nodes, then Lt axillo-inguinal and anterior inter-axillary anastomosis, then focused on medial aspect of Lt breast where scar tissue and some fibrosis palpated P/ROM to Lt shoulder flexion, abd and ER with MFR to Lt axilla during P/ROM where pt palpably tight with scar tissue and reports mild tender to touch  03/15/22: Manual Therapy MLD: In Supine with pt permission granted first: Short neck, 5 diaphragmatic breaths, Lt inguinal and Rt axillary nodes, then Lt axillo-inguinal and anterior inter-axillary anastomosis, then focused on medial aspect of Lt breast where scar tissue and some fibrosis palpated; this seemed some improved today from when this therapist saw her last.  P/ROM to Lt shoulder throughout MLD into flexion, abd and ER with MFR to Lt axilla during P/ROM where pt palpably tight with scar tissue and reports mild tender to touch today, this conts to soften well by end of session, was not able to palpate a cord today Cut another piece of peach Medi foam with small dots and placed this against 1/4" gray foam and placed in thin stockinette for pt to wear at medial aspect of breast   PATIENT EDUCATION:  Education details: importance of a walking program to decrease cancer related fatigue Person educated: Patient Education method:  Explanation Education comprehension: verbalized understanding   HOME EXERCISE PROGRAM: Walking program  ASSESSMENT: CLINICAL IMPRESSION: Assessed pt's progress towards goals in therapy. She has met most goals for therapy. She will still need to be instructed in Strength ABC program. Added new goal to address fibrosis at lumpectomy scar. No cording noted today or visible.  Axilla still presents with some fibrosis. Pt reports she is still having tenderness across her upper chest and there is increased fibrosis surrounding her lumpectomy scar so focused on softening this today with STM. Continued with MFR to L axilla while moving LUE through PROM. Pt would benefit from additional skilled PT services to be instructed in Strength ABC program and to decrease fibrosis at scar.   OBJECTIVE IMPAIRMENTS decreased knowledge of condition, decreased ROM, increased edema, increased fascial restrictions, postural dysfunction, and pain.   ACTIVITY LIMITATIONS  none reported   PARTICIPATION LIMITATIONS:  pt just limited in daily activities due to need for recovery periods due to [place] since onset of injury/illness/exacerbation are also affecting patient's functional outcome.   REHAB POTENTIAL: Good  CLINICAL DECISION MAKING: Stable/uncomplicated  EVALUATION COMPLEXITY: Low  GOALS: Goals reviewed with patient? Yes  SHORT TERM GOALS=LONG TERM GOALS  Target date: 03/30/2022    Pt will demonstrate 168 degrees of L shoulder abduction to allow her to reach out to the side.  Baseline: Goal status: MET 03/29/22 - 168 deg  2.  Pt will report a 75% improvement in pain and discomfort in L breast to allow improved comfort.  Baseline:  Goal status: MET 03/29/22- 75% improved  3.  Pt will be independent in Strength ABC program for continued stretching and strengthening at home.  Baseline:  Goal status: IN PROGRESS  4.  Pt will be independent in scar mobilization to decrease  discomfort in L breast.  Baseline:  Goal status: MET 03/29/22  5. Pt  will report a 50% improvement in fibrosis in area of lumpectomy scar to allow improved comfort.  Baseline:   Goal Status: NEW    PLAN: PT FREQUENCY: 2x/week  PT DURATION: 4 weeks  PLANNED INTERVENTIONS: Therapeutic exercises, Therapeutic activity, Patient/Family education, Self Care, Joint mobilization, Manual lymph drainage, scar mobilization, Taping, and Manual therapy  PLAN FOR NEXT SESSION: MFR to tightness Lt axilla, scar mobilization to lumpectomy scar, PROM to L shoulder, MLD to L breast, eventually teach Strength ABC   Allyson Sabal Blue, PT 03/29/2022, 10:56 AM

## 2022-03-31 ENCOUNTER — Ambulatory Visit: Payer: No Typology Code available for payment source | Admitting: Rehabilitation

## 2022-04-05 ENCOUNTER — Ambulatory Visit: Payer: No Typology Code available for payment source

## 2022-04-05 DIAGNOSIS — C50212 Malignant neoplasm of upper-inner quadrant of left female breast: Secondary | ICD-10-CM

## 2022-04-05 DIAGNOSIS — R293 Abnormal posture: Secondary | ICD-10-CM

## 2022-04-05 DIAGNOSIS — R600 Localized edema: Secondary | ICD-10-CM

## 2022-04-05 DIAGNOSIS — M25612 Stiffness of left shoulder, not elsewhere classified: Secondary | ICD-10-CM

## 2022-04-05 DIAGNOSIS — M6281 Muscle weakness (generalized): Secondary | ICD-10-CM

## 2022-04-05 DIAGNOSIS — Z483 Aftercare following surgery for neoplasm: Secondary | ICD-10-CM

## 2022-04-05 NOTE — Therapy (Signed)
OUTPATIENT PHYSICAL THERAPY ONCOLOGY TREATMENT  Patient Name: Kristyana Notte MRN: [id number] DOB:[birth year], 66 y.o., female Today's Date: 04/05/2022   PT End of Session - 04/05/22 1005     Visit Number 8    Number of Visits 15    Date for PT Re-Evaluation 04/26/22    PT Start Time 1005    PT Stop Time 1101    PT Time Calculation (min) 56 min    Behavior During Therapy WFL for tasks assessed/performed              Past Medical History:  Diagnosis Date   Complication of anesthesia    Diabetes mellitus without complication ([place])    TYPE II   Glaucoma    Hypertension    PONV (postoperative nausea and vomiting)    Past Surgical History:  Procedure Laterality Date   BREAST LUMPECTOMY WITH RADIOACTIVE SEED AND SENTINEL LYMPH NODE BIOPSY Left 09/21/2021   Procedure: LEFT BREAST LUMPECTOMY WITH RADIOACTIVE SEED X2 AND SENTINEL LYMPH NODE BIOPSY;  Surgeon: [name], MD;  Location: [place];  Service: General;  Laterality: Left;   CESAREAN SECTION     X2   EYE SURGERY Bilateral 2019   cataract surgery   RADIOACTIVE SEED GUIDED AXILLARY SENTINEL LYMPH NODE Left 09/21/2021   Procedure: RADIOACTIVE SEED GUIDED AXILLARY SENTINEL LYMPH NODE BIOPSY;  Surgeon: [name], MD;  Location: [facility];  Service: General;  Laterality: Left;   Patient Active Problem List   Diagnosis Date Noted   Genetic testing 09/17/2021   Family history of breast cancer 09/01/2021   Malignant neoplasm of upper-inner quadrant of left breast in female, estrogen receptor positive ([place]) 08/31/2021    PCP: [name], MD  REFERRING PROVIDER: Benay Pike   REFERRING DIAG: C50.212,Z17.0 (ICD-10-CM) - Malignant neoplasm of upper-inner quadrant of left breast in female, estrogen receptor positive ([place])  THERAPY DIAG:  Aftercare following surgery for neoplasm  Localized edema  Stiffness of left shoulder, not elsewhere classified  Muscle weakness (generalized)  Abnormal  posture  Malignant neoplasm of upper-inner quadrant of left breast in female, estrogen receptor positive ([place])  ONSET DATE: 09/21/21  Rationale for Evaluation and Treatment Rehabilitation  SUBJECTIVE                                                                                                                                                                                        SUBJECTIVE STATEMENT: My sugar was low last week and that's why I had to cancel. It's slowly been coming back up but still isn't where it needs to be. The hardness in my breast is a little smaller but still  firm.   PERTINENT HISTORY:  Patient was diagnosed on 05/20/2021 with left grade II invasive ductal carcinoma breast cancer. It measures 3.7 cm and is located in the upper inner quadrant. It is ER/PR positive and HER 2 negative with a Ki67 of 5%. 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 Surgery for left lumpectomy with SLNB was performed on 09/21/2021 with 1/5 LN's. Pt had a post op seroma  PAIN:  Are you having pain? No pain  PRECAUTIONS: Other: DM, hypertension, L UE lymphedema risk  WEIGHT BEARING RESTRICTIONS No  FALLS:  Has patient fallen in last 6 months? 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  HAND DOMINANCE : right   PRIOR LEVEL OF FUNCTION: Independent  PATIENT GOALS to feel better, less pain in breast   OBJECTIVE COGNITION:  Overall cognitive status: Within functional limits for tasks assessed   PALPATION: Increased fibrosis surrounding lumpectomy scar  OBSERVATIONS / OTHER ASSESSMENTS: discoloration in inferior breast  POSTURE: forward head and rounded shoulders  UPPER  EXTREMITY AROM/PROM:  A/PROM RIGHT   eval   Shoulder extension 65  Shoulder flexion 172  Shoulder abduction 168  Shoulder internal rotation 38  Shoulder external rotation 90    (Blank rows = not tested)  A/PROM LEFT   eval  Shoulder extension 57  Shoulder flexion 165  Shoulder abduction 150  Shoulder internal rotation 65  Shoulder external rotation 90    (Blank rows = not tested)   LYMPHEDEMA ASSESSMENTS:  SURGERY TYPE/DATE: left lumpectomy with SLNB 09/21/21 NUMBER OF LYMPH NODES REMOVED: 1/5 CHEMOTHERAPY: none RADIATION:completed HORMONE TREATMENT: currently on anastrozole INFECTIONS: none  LYMPHEDEMA ASSESSMENTS:   LANDMARK RIGHT  eval  10 cm proximal to olecranon process 30.5  Olecranon process 27  10 cm proximal to ulnar styloid process 23  Just proximal to ulnar styloid process 18  Across hand at thumb web space 19.7  At base of 2nd digit 6.3  (Blank rows = not tested)  LANDMARK LEFT  eval  10 cm proximal to olecranon process 34.5  Olecranon process 27  10 cm proximal to ulnar styloid process 21.7  Just proximal to ulnar styloid process 17.4  Across hand at thumb web space 18.6  At base of 2nd digit 6  (Blank rows = not tested)   BREAST COMPLAINTS SCALE: 27   TODAY'S TREATMENT  04/05/22: Manual Therapy STM/MFR to Left axilla in overhead position propped on pillow - no cording noted today only tightness and fibrosis, increased time spent on lumpectomy scar to decrease fibrosis; also in Rt S/L with cocoa butter to medial Lt scapula and upper trap where tightness palpable MLD: In Supine with pt permission granted first: Short neck, 5 diaphragmatic breaths, Lt inguinal and Rt axillary nodes, then Lt axillo-inguinal and anterior inter-axillary anastomosis, then focused on medial aspect of Lt breast where scar tissue and some fibrosis palpated P/ROM to Lt shoulder flexion, abduction, and D2 in pts available end motion with scapular depression  throughout  03/29/22: Manual Therapy STM/MFR to Left axilla in overhead position propped on pillow - no cording noted today only tightness and fibrosis, increased time spent on lumpectomy scar to decrease fibrosis MLD: In Supine with pt permission granted first: Short neck,  5 diaphragmatic breaths, Lt inguinal and Rt axillary nodes, then Lt axillo-inguinal and anterior inter-axillary anastomosis, then focused on medial aspect of Lt breast where scar tissue and some fibrosis palpated P/ROM to Lt shoulder flexion, and abduction  03/24/22: Manual Therapy STM/MFR to Left axilla in overhead position propped on pillow - some cording noted again today MLD: In Supine with pt permission granted first: Short neck, 5 diaphragmatic breaths, Lt inguinal and Rt axillary nodes, then Lt axillo-inguinal and anterior inter-axillary anastomosis, then focused on medial aspect of Lt breast where scar tissue and some fibrosis palpated P/ROM to Lt shoulder flexion, abd and ER with MFR to Lt axilla during P/ROM where pt palpably tight with scar tissue and reports mild tender to touch TE: dowel flexion 5" x 5, chest stretch 5x5", wall ball flexion 5" x 5     PATIENT EDUCATION:  Education details: importance of a walking program to decrease cancer related fatigue Person educated: Patient Education method: Explanation Education comprehension: verbalized understanding   HOME EXERCISE PROGRAM: Walking program  ASSESSMENT: CLINICAL IMPRESSION: Since this therapist saw pt last her fibrosis around her lumpectomy scar is smaller than before and pt reports noticing same. Also, her axilla was very tender to touch and firm at start of care but today this, though still tight, feels less so and her tenderness to touch is much improved as well. Interpreter was present for duration of session.   OBJECTIVE IMPAIRMENTS decreased knowledge of condition, decreased ROM, increased edema, increased fascial restrictions, postural  dysfunction, and pain.   ACTIVITY LIMITATIONS  none reported   PARTICIPATION LIMITATIONS:  pt just limited in daily activities due to need for recovery periods due to [place] Time since onset of injury/illness/exacerbation are also affecting patient's functional outcome.   REHAB POTENTIAL: Good  CLINICAL DECISION MAKING: Stable/uncomplicated  EVALUATION COMPLEXITY: Low  GOALS: Goals reviewed with patient? Yes  SHORT TERM GOALS=LONG TERM GOALS  Target date: 03/30/2022    Pt will demonstrate 168 degrees of L shoulder abduction to allow her to reach out to the side.  Baseline: Goal status: MET 03/29/22 - 168 deg  2.  Pt will report a 75% improvement in pain and discomfort in L breast to allow improved comfort.  Baseline:  Goal status: MET 03/29/22- 75% improved  3.  Pt will be independent in Strength ABC program for continued stretching and strengthening at home.  Baseline:  Goal status: IN PROGRESS  4.  Pt will be independent in scar mobilization to decrease discomfort in L breast.  Baseline:  Goal status: MET 03/29/22  5. Pt will report a 50% improvement in fibrosis in area of lumpectomy scar to allow improved comfort.  Baseline:   Goal Status: NEW    PLAN: PT FREQUENCY: 2x/week  PT DURATION: 4 weeks  PLANNED INTERVENTIONS: Therapeutic exercises, Therapeutic activity, Patient/Family education, Self Care, Joint mobilization, Manual lymph drainage, scar mobilization, Taping, and Manual therapy  PLAN FOR NEXT SESSION: Begin instructing in Strength ABC and cont MFR to tightness Lt axilla, scar mobilization to lumpectomy scar, PROM to L shoulder, MLD to L breast   Otelia Limes, PTA 04/05/2022, 11:08 AM

## 2022-04-06 ENCOUNTER — Other Ambulatory Visit: Payer: Self-pay | Admitting: *Deleted

## 2022-04-06 MED ORDER — ANASTROZOLE 1 MG PO TABS: 1.0000 mg | ORAL_TABLET | Freq: Every day | ORAL | 3 refills | Status: DC

## 2022-04-07 ENCOUNTER — Ambulatory Visit: Payer: No Typology Code available for payment source | Admitting: Physical Therapy

## 2022-04-07 ENCOUNTER — Encounter: Payer: Self-pay | Admitting: Physical Therapy

## 2022-04-07 DIAGNOSIS — M6281 Muscle weakness (generalized): Secondary | ICD-10-CM

## 2022-04-07 DIAGNOSIS — R600 Localized edema: Secondary | ICD-10-CM

## 2022-04-07 DIAGNOSIS — R293 Abnormal posture: Secondary | ICD-10-CM

## 2022-04-07 DIAGNOSIS — Z483 Aftercare following surgery for neoplasm: Secondary | ICD-10-CM

## 2022-04-07 DIAGNOSIS — M25612 Stiffness of left shoulder, not elsewhere classified: Secondary | ICD-10-CM

## 2022-04-07 DIAGNOSIS — Z170 Estrogen receptor positive status [ER+]: Secondary | ICD-10-CM

## 2022-04-07 NOTE — Therapy (Signed)
OUTPATIENT PHYSICAL THERAPY ONCOLOGY TREATMENT  Patient Name: Kelli Vaughn MRN: [id number] DOB:September 11, 1955, 66 y.o., female Today's Date: 04/07/2022   PT End of Session - 04/07/22 1107     Visit Number 9    Number of Visits 15    Date for PT Re-Evaluation 04/26/22    PT Start Time 1105    PT Stop Time 1153    PT Time Calculation (min) 48 min    Activity Tolerance Patient tolerated treatment well    Behavior During Therapy WFL for tasks assessed/performed              Past Medical History:  Diagnosis Date   Complication of anesthesia    Diabetes mellitus without complication ([place])    TYPE II   Glaucoma    Hypertension    PONV (postoperative nausea and vomiting)    Past Surgical History:  Procedure Laterality Date   BREAST LUMPECTOMY WITH RADIOACTIVE SEED AND SENTINEL LYMPH NODE BIOPSY Left 09/21/2021   Procedure: LEFT BREAST LUMPECTOMY WITH RADIOACTIVE SEED X2 AND SENTINEL LYMPH NODE BIOPSY;  Surgeon: [name], MD;  Location: [facility];  Service: General;  Laterality: Left;   CESAREAN SECTION     X2   EYE SURGERY Bilateral 2019   cataract surgery   RADIOACTIVE SEED GUIDED AXILLARY SENTINEL LYMPH NODE Left 09/21/2021   Procedure: RADIOACTIVE SEED GUIDED AXILLARY SENTINEL LYMPH NODE BIOPSY;  Surgeon: [name], MD;  Location: [facility] [place] [facility];  Service: General;  Laterality: Left;   Patient Active Problem List   Diagnosis Date Noted   Genetic testing 09/17/2021   Family history of breast cancer 09/01/2021   Malignant neoplasm of upper-inner quadrant of left breast in female, estrogen receptor positive ([place]) 08/31/2021    PCP: [name], MD  REFERRING PROVIDER: Benay Pike   REFERRING DIAG: C50.212,Z17.0 (ICD-10-CM) - Malignant neoplasm of upper-inner quadrant of left breast in female, estrogen receptor positive ([place])  THERAPY DIAG:  Stiffness of left shoulder, not elsewhere classified  Aftercare following surgery for neoplasm  Localized  edema  Muscle weakness (generalized)  Abnormal posture  Malignant neoplasm of upper-inner quadrant of left breast in female, estrogen receptor positive ([place])  ONSET DATE: 09/21/21  Rationale for Evaluation and Treatment Rehabilitation  SUBJECTIVE                                                                                                                                                                                        SUBJECTIVE STATEMENT: Nothing is bothering me today. The swelling and discomfort is much better. I feel like the lump has diminished.   PERTINENT HISTORY:  Patient was diagnosed on 05/20/2021  with left grade II invasive ductal carcinoma breast cancer. It measures 3.7 cm and is located in the upper inner quadrant. It is ER/PR positive and HER 2 negative with a Ki67 of 5%. 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 Surgery for left lumpectomy with SLNB was performed on 09/21/2021 with 1/5 LN's. Pt had a post op seroma  PAIN:  Are you having pain? No pain  PRECAUTIONS: Other: DM, hypertension, L UE lymphedema risk  WEIGHT BEARING RESTRICTIONS No  FALLS:  Has patient fallen in last 6 months? 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  HAND DOMINANCE : right   PRIOR LEVEL OF FUNCTION: Independent  PATIENT GOALS to feel better, less pain in breast   OBJECTIVE COGNITION:  Overall cognitive status: Within functional limits for tasks assessed   PALPATION: Increased fibrosis surrounding lumpectomy scar  OBSERVATIONS / OTHER ASSESSMENTS: discoloration in inferior breast  POSTURE: forward head and rounded shoulders  UPPER EXTREMITY AROM/PROM:  A/PROM  RIGHT   eval   Shoulder extension 65  Shoulder flexion 172  Shoulder abduction 168  Shoulder internal rotation 38  Shoulder external rotation 90    (Blank rows = not tested)  A/PROM LEFT   eval  Shoulder extension 57  Shoulder flexion 165  Shoulder abduction 150  Shoulder internal rotation 65  Shoulder external rotation 90    (Blank rows = not tested)   LYMPHEDEMA ASSESSMENTS:  SURGERY TYPE/DATE: left lumpectomy with SLNB 09/21/21 NUMBER OF LYMPH NODES REMOVED: 1/5 CHEMOTHERAPY: none RADIATION:completed HORMONE TREATMENT: currently on anastrozole INFECTIONS: none  LYMPHEDEMA ASSESSMENTS:   LANDMARK RIGHT  eval  10 cm proximal to olecranon process 30.5  Olecranon process 27  10 cm proximal to ulnar styloid process 23  Just proximal to ulnar styloid process 18  Across hand at thumb web space 19.7  At base of 2nd digit 6.3  (Blank rows = not tested)  LANDMARK LEFT  eval  10 cm proximal to olecranon process 34.5  Olecranon process 27  10 cm proximal to ulnar styloid process 21.7  Just proximal to ulnar styloid process 17.4  Across hand at thumb web space 18.6  At base of 2nd digit 6  (Blank rows = not tested)   BREAST COMPLAINTS SCALE: 27   TODAY'S TREATMENT  04/07/22:  Began instructing pt in Strength ABC program today. Highlighted the most importance information and educated pt on how to properly progress reps and weight without increasing risk of lymphedema. Did not have a Spanish copy for her but she reports her son is able to translate it for her. Instructed pt in all stretches and educated her to hold them for 5 secs. Had her return demonstrate each stretch and provided verbal and tactile cues as needed. Instructed pt in superwoman but educated her to begin with only 3 to 5 reps on each side instead of 10 since this was challenging for her. Substituted pelvic tilts for crunches and had interpreter write down in spanish correct instructions for this was pt  completed 10 of these. She required mod v/c and t/c to perform correctly. Instructed pt in core exercises and strengthening exercises until chest press. Pt to practice the program until this point and will educate  her in the rest at next session.   04/05/22: Manual Therapy STM/MFR to Left axilla in overhead position propped on pillow - no cording noted today only tightness and fibrosis, increased time spent on lumpectomy scar to decrease fibrosis; also in Rt S/L with cocoa butter to medial Lt scapula and upper trap where tightness palpable MLD: In Supine with pt permission granted first: Short neck, 5 diaphragmatic breaths, Lt inguinal and Rt axillary nodes, then Lt axillo-inguinal and anterior inter-axillary anastomosis, then focused on medial aspect of Lt breast where scar tissue and some fibrosis palpated P/ROM to Lt shoulder flexion, abduction, and D2 in pts available end motion with scapular depression throughout  03/29/22: Manual Therapy STM/MFR to Left axilla in overhead position propped on pillow - no cording noted today only tightness and fibrosis, increased time spent on lumpectomy scar to decrease fibrosis MLD: In Supine with pt permission granted first: Short neck, 5 diaphragmatic breaths, Lt inguinal and Rt axillary nodes, then Lt axillo-inguinal and anterior inter-axillary anastomosis, then focused on medial aspect of Lt breast where scar tissue and some fibrosis palpated P/ROM to Lt shoulder flexion, and abduction  03/24/22: Manual Therapy STM/MFR to Left axilla in overhead position propped on pillow - some cording noted again today MLD: In Supine with pt permission granted first: Short neck, 5 diaphragmatic breaths, Lt inguinal and Rt axillary nodes, then Lt axillo-inguinal and anterior inter-axillary anastomosis, then focused on medial aspect of Lt breast where scar tissue and some fibrosis palpated P/ROM to Lt shoulder flexion, abd and ER with MFR to Lt axilla during P/ROM where pt  palpably tight with scar tissue and reports mild tender to touch TE: dowel flexion 5" x 5, chest stretch 5x5", wall ball flexion 5" x 5     PATIENT EDUCATION:  Education details: importance of a walking program to decrease cancer related fatigue Person educated: Patient Education method: Explanation Education comprehension: verbalized understanding   HOME EXERCISE PROGRAM: Walking program  ASSESSMENT: CLINICAL IMPRESSION: Began instructing pt in Strength ABC program today. Pt was educated in proper way to progress reps and resistance with exercises today. She verbalized understanding. Educated pt to begin with light cardio for 10 min, then stretches holding each for 15 sec then strengthening exercises x 10 reps each then stretching holding for 30 sec. Pt will be instructed in remaining exercises at next session.   OBJECTIVE IMPAIRMENTS decreased knowledge of condition, decreased ROM, increased edema, increased fascial restrictions, postural dysfunction, and pain.   ACTIVITY LIMITATIONS  none reported   PARTICIPATION LIMITATIONS:  pt just limited in daily activities due to need for recovery periods due to [place] Time since onset of injury/illness/exacerbation are also affecting patient's functional outcome.   REHAB POTENTIAL: Good  CLINICAL DECISION MAKING: Stable/uncomplicated  EVALUATION COMPLEXITY: Low  GOALS: Goals reviewed with patient? Yes  SHORT TERM GOALS=LONG TERM GOALS  Target date: 03/30/2022    Pt will demonstrate 168 degrees of L shoulder abduction to allow her to reach out to the side.  Baseline: Goal status: MET 03/29/22 - 168 deg  2.  Pt will report a 75% improvement in pain and discomfort in L breast to allow improved comfort.  Baseline:  Goal status: MET 03/29/22- 75% improved  3.  Pt will be independent in Strength ABC program for continued stretching and strengthening at home.  Baseline:  Goal status: IN PROGRESS  4.  Pt will be  independent in scar mobilization to decrease discomfort in L breast.  Baseline:  Goal status: MET 03/29/22  5. Pt will report a 50% improvement in fibrosis in area of lumpectomy scar to allow improved comfort.  Baseline:   Goal Status: NEW    PLAN: PT FREQUENCY: 2x/week  PT DURATION: 4 weeks  PLANNED INTERVENTIONS: Therapeutic exercises, Therapeutic activity, Patient/Family education, Self Care, Joint mobilization, Manual lymph drainage, scar mobilization, Taping, and Manual therapy  PLAN FOR NEXT SESSION: cont to instruct in Strength ABC and cont MFR to tightness Lt axilla, scar mobilization to lumpectomy scar, PROM to L shoulder, MLD to L breast   Manus Gunning, PT 04/07/2022, 2:15 PM

## 2022-04-08 ENCOUNTER — Encounter: Payer: Self-pay | Admitting: Hematology and Oncology

## 2022-04-08 NOTE — Progress Notes (Signed)
Customer service rep Lanny Hurst confirmed via email he received the patient's financial assistance application I emailed him.

## 2022-04-08 NOTE — Progress Notes (Signed)
Received scanned financial assistance application and letter of support.  E-faxed to customer service as well as emailed to them for processing.

## 2022-04-12 ENCOUNTER — Ambulatory Visit: Payer: No Typology Code available for payment source | Attending: Hematology and Oncology

## 2022-04-12 DIAGNOSIS — Z483 Aftercare following surgery for neoplasm: Secondary | ICD-10-CM

## 2022-04-12 DIAGNOSIS — Z170 Estrogen receptor positive status [ER+]: Secondary | ICD-10-CM | POA: Insufficient documentation

## 2022-04-12 DIAGNOSIS — R293 Abnormal posture: Secondary | ICD-10-CM

## 2022-04-12 DIAGNOSIS — M6281 Muscle weakness (generalized): Secondary | ICD-10-CM

## 2022-04-12 DIAGNOSIS — C50212 Malignant neoplasm of upper-inner quadrant of left female breast: Secondary | ICD-10-CM

## 2022-04-12 DIAGNOSIS — R600 Localized edema: Secondary | ICD-10-CM

## 2022-04-12 DIAGNOSIS — M25612 Stiffness of left shoulder, not elsewhere classified: Secondary | ICD-10-CM

## 2022-04-12 NOTE — Therapy (Signed)
OUTPATIENT PHYSICAL THERAPY ONCOLOGY TREATMENT  Patient Name: Kelli Vaughn MRN: [id number] DOB:05/29/1956, 66 y.o., female Today's Date: 04/12/2022   PT End of Session - 04/12/22 1007     Visit Number 10    Number of Visits 15    Date for PT Re-Evaluation 04/26/22    PT Start Time 1005    PT Stop Time 1101    PT Time Calculation (min) 56 min    Activity Tolerance Patient tolerated treatment well    Behavior During Therapy WFL for tasks assessed/performed              Past Medical History:  Diagnosis Date   Complication of anesthesia    Diabetes mellitus without complication ([place])    TYPE II   Glaucoma    Hypertension    PONV (postoperative nausea and vomiting)    Past Surgical History:  Procedure Laterality Date   BREAST LUMPECTOMY WITH RADIOACTIVE SEED AND SENTINEL LYMPH NODE BIOPSY Left 09/21/2021   Procedure: LEFT BREAST LUMPECTOMY WITH RADIOACTIVE SEED X2 AND SENTINEL LYMPH NODE BIOPSY;  Surgeon: [name], MD;  Location: [facility];  Service: General;  Laterality: Left;   CESAREAN SECTION     X2   EYE SURGERY Bilateral 2019   cataract surgery   RADIOACTIVE SEED GUIDED AXILLARY SENTINEL LYMPH NODE Left 09/21/2021   Procedure: RADIOACTIVE SEED GUIDED AXILLARY SENTINEL LYMPH NODE BIOPSY;  Surgeon: [name], MD;  Location: [facility];  Service: General;  Laterality: Left;   Patient Active Problem List   Diagnosis Date Noted   Genetic testing 09/17/2021   Family history of breast cancer 09/01/2021   Malignant neoplasm of upper-inner quadrant of left breast in female, estrogen receptor positive ([place]) 08/31/2021    PCP: [name], MD  REFERRING PROVIDER: Benay Pike   REFERRING DIAG: C50.212,Z17.0 (ICD-10-CM) - Malignant neoplasm of upper-inner quadrant of left breast in female, estrogen receptor positive ([place])  THERAPY DIAG:  Stiffness of left shoulder, not elsewhere classified  Aftercare following surgery for neoplasm  Localized  edema  Muscle weakness (generalized)  Abnormal posture  Malignant neoplasm of upper-inner quadrant of left breast in female, estrogen receptor positive ([place])  ONSET DATE: 09/21/21  Rationale for Evaluation and Treatment Rehabilitation  SUBJECTIVE                                                                                                                                                                                        SUBJECTIVE STATEMENT: My breast is getting a lot better. I felt fine after the new exercises she started me on last time. I do some of them slowly so my legs  won't cramp.   PERTINENT HISTORY:  Patient was diagnosed on 05/20/2021 with left grade II invasive ductal carcinoma breast cancer. It measures 3.7 cm and is located in the upper inner quadrant. It is ER/PR positive and HER 2 negative with a Ki67 of 5%. 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 Surgery for left lumpectomy with SLNB was performed on 09/21/2021 with 1/5 LN's. Pt had a post op seroma  PAIN:  Are you having pain? No pain  PRECAUTIONS: Other: DM, hypertension, L UE lymphedema risk  WEIGHT BEARING RESTRICTIONS No  FALLS:  Has patient fallen in last 6 months? 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  HAND DOMINANCE : right   PRIOR LEVEL OF FUNCTION: Independent  PATIENT GOALS to feel better, less pain in breast   OBJECTIVE COGNITION:  Overall cognitive status: Within functional limits for tasks assessed   PALPATION: Increased fibrosis surrounding lumpectomy scar  OBSERVATIONS / OTHER ASSESSMENTS: discoloration in inferior breast  POSTURE: forward head and rounded  shoulders  UPPER EXTREMITY AROM/PROM:  A/PROM RIGHT   eval   Shoulder extension 65  Shoulder flexion 172  Shoulder abduction 168  Shoulder internal rotation 38  Shoulder external rotation 90    (Blank rows = not tested)  A/PROM LEFT   eval  Shoulder extension 57  Shoulder flexion 165  Shoulder abduction 150  Shoulder internal rotation 65  Shoulder external rotation 90    (Blank rows = not tested)   LYMPHEDEMA ASSESSMENTS:  SURGERY TYPE/DATE: left lumpectomy with SLNB 09/21/21 NUMBER OF LYMPH NODES REMOVED: 1/5 CHEMOTHERAPY: none RADIATION:completed HORMONE TREATMENT: currently on anastrozole INFECTIONS: none  LYMPHEDEMA ASSESSMENTS:   LANDMARK RIGHT  eval  10 cm proximal to olecranon process 30.5  Olecranon process 27  10 cm proximal to ulnar styloid process 23  Just proximal to ulnar styloid process 18  Across hand at thumb web space 19.7  At base of 2nd digit 6.3  (Blank rows = not tested)  LANDMARK LEFT  eval  10 cm proximal to olecranon process 34.5  Olecranon process 27  10 cm proximal to ulnar styloid process 21.7  Just proximal to ulnar styloid process 17.4  Across hand at thumb web space 18.6  At base of 2nd digit 6  (Blank rows = not tested)   BREAST COMPLAINTS SCALE: 27   TODAY'S TREATMENT  04/12/22: Therapeutic Exercises Completed instruction of strength ABC Program having pt return demo of each, 5 reps each side. Tactile and VCs throughout for correct technique and posture as well Manual Therapy MFR to Left axilla in overhead position MLD: In Supine with pt permission granted first: Short neck, 5 diaphragmatic breaths, Lt inguinal and Rt axillary nodes, then Lt axillo-inguinal and anterior inter-axillary anastomosis, then focused on medial aspect of Lt breast where scar tissue and some fibrosis palpated P/ROM to Lt shoulder flexion, abduction, and D2 in pts available end motion with scapular depression throughout  04/07/22:  Began  instructing pt in Strength ABC program today. Highlighted the most importance information and educated pt on how to properly progress reps and weight without increasing risk of lymphedema. Did not have a Spanish copy for her but she reports her son is able to translate it  for her. Instructed pt in all stretches and educated her to hold them for 5 secs. Had her return demonstrate each stretch and provided verbal and tactile cues as needed. Instructed pt in superwoman but educated her to begin with only 3 to 5 reps on each side instead of 10 since this was challenging for her. Substituted pelvic tilts for crunches and had interpreter write down in spanish correct instructions for this was pt completed 10 of these. She required mod v/c and t/c to perform correctly. Instructed pt in core exercises and strengthening exercises until chest press. Pt to practice the program until this point and will educate her in the rest at next session.   04/05/22: Manual Therapy STM/MFR to Left axilla in overhead position propped on pillow - no cording noted today only tightness and fibrosis, increased time spent on lumpectomy scar to decrease fibrosis; also in Rt S/L with cocoa butter to medial Lt scapula and upper trap where tightness palpable MLD: In Supine with pt permission granted first: Short neck, 5 diaphragmatic breaths, Lt inguinal and Rt axillary nodes, then Lt axillo-inguinal and anterior inter-axillary anastomosis, then focused on medial aspect of Lt breast where scar tissue and some fibrosis palpated P/ROM to Lt shoulder flexion, abduction, and D2 in pts available end motion with scapular depression throughout    PATIENT EDUCATION:  Education details:Strength ABC Program Person educated: Patient with interpreter Education method: Explanation, Demonstration and Handout Education comprehension: verbalized understanding, returned demonstration, needs further review   HOME EXERCISE PROGRAM: Walking  program  ASSESSMENT: CLINICAL IMPRESSION: Completed instruction of Strength ABC Program. Pt was instructed in "low and slow" progression to keep her risk of lymphedema low. Interpreter was present throughout to assist. Then ended session with manual therapy. Fibrosis is much improved since start of care.   OBJECTIVE IMPAIRMENTS decreased knowledge of condition, decreased ROM, increased edema, increased fascial restrictions, postural dysfunction, and pain.   ACTIVITY LIMITATIONS  none reported   PARTICIPATION LIMITATIONS:  pt just limited in daily activities due to need for recovery periods due to [place] Time since onset of injury/illness/exacerbation are also affecting patient's functional outcome.   REHAB POTENTIAL: Good  CLINICAL DECISION MAKING: Stable/uncomplicated  EVALUATION COMPLEXITY: Low  GOALS: Goals reviewed with patient? Yes  SHORT TERM GOALS=LONG TERM GOALS  Target date: 03/30/2022    Pt will demonstrate 168 degrees of L shoulder abduction to allow her to reach out to the side.  Baseline: Goal status: MET 03/29/22 - 168 deg  2.  Pt will report a 75% improvement in pain and discomfort in L breast to allow improved comfort.  Baseline:  Goal status: MET 03/29/22- 75% improved  3.  Pt will be independent in Strength ABC program for continued stretching and strengthening at home.  Baseline:  Goal status: IN PROGRESS  4.  Pt will be independent in scar mobilization to decrease discomfort in L breast.  Baseline:  Goal status: MET 03/29/22  5. Pt will report a 50% improvement in fibrosis in area of lumpectomy scar to allow improved comfort.  Baseline:   Goal Status: NEW    PLAN: PT FREQUENCY: 2x/week  PT DURATION: 4 weeks  PLANNED INTERVENTIONS: Therapeutic exercises, Therapeutic activity, Patient/Family education, Self Care, Joint mobilization, Manual lymph drainage, scar mobilization, Taping, and Manual therapy  PLAN FOR NEXT SESSION:  Review Strength ABC prn; did pt get to try?  cont MFR to tightness Lt axilla, scar mobilization to lumpectomy scar, PROM to L shoulder, MLD to L  breast   Otelia Limes, PTA 04/12/2022, 11:05 AM

## 2022-04-14 ENCOUNTER — Ambulatory Visit: Payer: No Typology Code available for payment source

## 2022-04-14 DIAGNOSIS — R600 Localized edema: Secondary | ICD-10-CM

## 2022-04-14 DIAGNOSIS — M25612 Stiffness of left shoulder, not elsewhere classified: Secondary | ICD-10-CM

## 2022-04-14 DIAGNOSIS — Z483 Aftercare following surgery for neoplasm: Secondary | ICD-10-CM

## 2022-04-14 DIAGNOSIS — Z170 Estrogen receptor positive status [ER+]: Secondary | ICD-10-CM

## 2022-04-14 DIAGNOSIS — R293 Abnormal posture: Secondary | ICD-10-CM

## 2022-04-14 DIAGNOSIS — M6281 Muscle weakness (generalized): Secondary | ICD-10-CM

## 2022-04-14 NOTE — Therapy (Signed)
OUTPATIENT PHYSICAL THERAPY ONCOLOGY TREATMENT  Patient Name: Elim Peale MRN: [id number] DOB:02/08/1956, 66 y.o., female Today's Date: 04/14/2022   PT End of Session - 04/14/22 1102     Visit Number 11    Number of Visits 15    Date for PT Re-Evaluation 04/26/22    PT Start Time 1103    PT Stop Time 1147    PT Time Calculation (min) 44 min    Activity Tolerance Patient tolerated treatment well    Behavior During Therapy WFL for tasks assessed/performed              Past Medical History:  Diagnosis Date   Complication of anesthesia    Diabetes mellitus without complication ([place])    TYPE II   Glaucoma    Hypertension    PONV (postoperative nausea and vomiting)    Past Surgical History:  Procedure Laterality Date   BREAST LUMPECTOMY WITH RADIOACTIVE SEED AND SENTINEL LYMPH NODE BIOPSY Left 09/21/2021   Procedure: LEFT BREAST LUMPECTOMY WITH RADIOACTIVE SEED X2 AND SENTINEL LYMPH NODE BIOPSY;  Surgeon: [name], MD;  Location: [facility];  Service: General;  Laterality: Left;   CESAREAN SECTION     X2   EYE SURGERY Bilateral 2019   cataract surgery   RADIOACTIVE SEED GUIDED AXILLARY SENTINEL LYMPH NODE Left 09/21/2021   Procedure: RADIOACTIVE SEED GUIDED AXILLARY SENTINEL LYMPH NODE BIOPSY;  Surgeon: [name], MD;  Location: [facility];  Service: General;  Laterality: Left;   Patient Active Problem List   Diagnosis Date Noted   Genetic testing 09/17/2021   Family history of breast cancer 09/01/2021   Malignant neoplasm of upper-inner quadrant of left breast in female, estrogen receptor positive ([place]) 08/31/2021    PCP: [name], MD  REFERRING PROVIDER: Benay Pike   REFERRING DIAG: C50.212,Z17.0 (ICD-10-CM) - Malignant neoplasm of upper-inner quadrant of left breast in female, estrogen receptor positive ([place])  THERAPY DIAG:  Stiffness of left shoulder, not elsewhere classified  Aftercare following surgery for neoplasm  Localized  edema  Muscle weakness (generalized)  Abnormal posture  Malignant neoplasm of upper-inner quadrant of left breast in female, estrogen receptor positive ([place])  ONSET DATE: 09/21/21  Rationale for Evaluation and Treatment Rehabilitation  SUBJECTIVE                                                                                                                                                                                        SUBJECTIVE STATEMENT: I am doing the exercises and I think they are helping me. The area under my arm still hurts and the area in my chest still feels hard. I  am doing better overall. She soul like to use several more visits to see if she can improve the tightness, and swelling   PERTINENT HISTORY:  Patient was diagnosed on 05/20/2021 with left grade II invasive ductal carcinoma breast cancer. It measures 3.7 cm and is located in the upper inner quadrant. It is ER/PR positive and HER 2 negative with a Ki67 of 5%. 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 Surgery for left lumpectomy with SLNB was performed on 09/21/2021 with 1/5 LN's. Pt had a post op seroma  PAIN:  Are you having pain? No pain  PRECAUTIONS: Other: DM, hypertension, L UE lymphedema risk  WEIGHT BEARING RESTRICTIONS No  FALLS:  Has patient fallen in last 6 months? 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  HAND DOMINANCE : right   PRIOR LEVEL OF FUNCTION: Independent  PATIENT GOALS to feel better, less pain in breast   OBJECTIVE COGNITION:  Overall cognitive status: Within functional limits for tasks assessed   PALPATION: Increased fibrosis surrounding lumpectomy  scar  OBSERVATIONS / OTHER ASSESSMENTS: discoloration in inferior breast  POSTURE: forward head and rounded shoulders  UPPER EXTREMITY AROM/PROM:  A/PROM RIGHT   eval   Shoulder extension 65  Shoulder flexion 172  Shoulder abduction 168  Shoulder internal rotation 38  Shoulder external rotation 90    (Blank rows = not tested)  A/PROM LEFT   eval  Shoulder extension 57  Shoulder flexion 165  Shoulder abduction 150  Shoulder internal rotation 65  Shoulder external rotation 90    (Blank rows = not tested)   LYMPHEDEMA ASSESSMENTS:  SURGERY TYPE/DATE: left lumpectomy with SLNB 09/21/21 NUMBER OF LYMPH NODES REMOVED: 1/5 CHEMOTHERAPY: none RADIATION:completed HORMONE TREATMENT: currently on anastrozole INFECTIONS: none  LYMPHEDEMA ASSESSMENTS:   LANDMARK RIGHT  eval  10 cm proximal to olecranon process 30.5  Olecranon process 27  10 cm proximal to ulnar styloid process 23  Just proximal to ulnar styloid process 18  Across hand at thumb web space 19.7  At base of 2nd digit 6.3  (Blank rows = not tested)  LANDMARK LEFT  eval  10 cm proximal to olecranon process 34.5  Olecranon process 27  10 cm proximal to ulnar styloid process 21.7  Just proximal to ulnar styloid process 17.4  Across hand at thumb web space 18.6  At base of 2nd digit 6  (Blank rows = not tested)   BREAST COMPLAINTS SCALE: 27   TODAY'S TREATMENT  04/14/2022  Manual Therapy Soft tissue mobilization with cocoa butter to left clavicular, and axillary border of pectorals and left lateral trunk MFR to Left axilla in overhead position. Cording noted today and is tender PROM left shoulder flexion, abd, d2 flexion with MFR to axilla region MLD: In Supine with pt permission granted first: Short neck, 5 diaphragmatic breaths, Lt inguinal and Rt axillary nodes, then Lt axillo-inguinal and anterior inter-axillary anastomosis, then focused on medial aspect of Lt breast where scar tissue and some  fibrosis palpated  04/12/22: Therapeutic Exercises Completed instruction of strength ABC Program having pt return demo of each, 5 reps each side. Tactile and VCs throughout for correct technique and posture as well Manual Therapy MFR to Left axilla in overhead  position MLD: In Supine with pt permission granted first: Short neck, 5 diaphragmatic breaths, Lt inguinal and Rt axillary nodes, then Lt axillo-inguinal and anterior inter-axillary anastomosis, then focused on medial aspect of Lt breast where scar tissue and some fibrosis palpated P/ROM to Lt shoulder flexion, abduction, and D2 in pts available end motion with scapular depression throughout  04/07/22:  Began instructing pt in Strength ABC program today. Highlighted the most importance information and educated pt on how to properly progress reps and weight without increasing risk of lymphedema. Did not have a Spanish copy for her but she reports her son is able to translate it for her. Instructed pt in all stretches and educated her to hold them for 5 secs. Had her return demonstrate each stretch and provided verbal and tactile cues as needed. Instructed pt in superwoman but educated her to begin with only 3 to 5 reps on each side instead of 10 since this was challenging for her. Substituted pelvic tilts for crunches and had interpreter write down in spanish correct instructions for this was pt completed 10 of these. She required mod v/c and t/c to perform correctly. Instructed pt in core exercises and strengthening exercises until chest press. Pt to practice the program until this point and will educate her in the rest at next session.   04/05/22: Manual Therapy STM/MFR to Left axilla in overhead position propped on pillow - no cording noted today only tightness and fibrosis, increased time spent on lumpectomy scar to decrease fibrosis; also in Rt S/L with cocoa butter to medial Lt scapula and upper trap where tightness palpable MLD: In Supine  with pt permission granted first: Short neck, 5 diaphragmatic breaths, Lt inguinal and Rt axillary nodes, then Lt axillo-inguinal and anterior inter-axillary anastomosis, then focused on medial aspect of Lt breast where scar tissue and some fibrosis palpated P/ROM to Lt shoulder flexion, abduction, and D2 in pts available end motion with scapular depression throughout    PATIENT EDUCATION:  Education details:Strength ABC Program Person educated: Patient with interpreter Education method: Explanation, Demonstration and Handout Education comprehension: verbalized understanding, returned demonstration, needs further review   HOME EXERCISE PROGRAM: Walking program  ASSESSMENT: CLINICAL IMPRESSION: Pt did not have any questions about ABC strength program but expressed concern of firmness at incision and tightness in the axillary region. Cording was able to be palpated today. Continued soft tissue mobilization, MFR techniques and PROM in addition to MLD. Fibrosis/?scar tissue still noted at incision site. Pt felt she would benefit from completing her sessions that were scheduled.  OBJECTIVE IMPAIRMENTS decreased knowledge of condition, decreased ROM, increased edema, increased fascial restrictions, postural dysfunction, and pain.   ACTIVITY LIMITATIONS  none reported   PARTICIPATION LIMITATIONS:  pt just limited in daily activities due to need for recovery periods due to [place] Time since onset of injury/illness/exacerbation are also affecting patient's functional outcome.   REHAB POTENTIAL: Good  CLINICAL DECISION MAKING: Stable/uncomplicated  EVALUATION COMPLEXITY: Low  GOALS: Goals reviewed with patient? Yes  SHORT TERM GOALS=LONG TERM GOALS  Target date: 03/30/2022    Pt will demonstrate 168 degrees of L shoulder abduction to allow her to reach out to the side.  Baseline: Goal status: MET 03/29/22 - 168 deg  2.  Pt will report a 75% improvement in pain and  discomfort in L breast to allow improved comfort.  Baseline:  Goal status: MET 03/29/22- 75% improved  3.  Pt will be independent in Strength ABC program for continued  stretching and strengthening at home.  Baseline:  Goal status: IN PROGRESS  4.  Pt will be independent in scar mobilization to decrease discomfort in L breast.  Baseline:  Goal status: MET 03/29/22  5. Pt will report a 50% improvement in fibrosis in area of lumpectomy scar to allow improved comfort.  Baseline:   Goal Status: NEW    PLAN: PT FREQUENCY: 2x/week  PT DURATION: 4 weeks  PLANNED INTERVENTIONS: Therapeutic exercises, Therapeutic activity, Patient/Family education, Self Care, Joint mobilization, Manual lymph drainage, scar mobilization, Taping, and Manual therapy  PLAN FOR NEXT SESSION: Review Strength ABC prn; did pt get to try?  cont MFR to tightness Lt axilla, scar mobilization to lumpectomy scar, PROM to L shoulder, MLD to L breast   Claris Pong, PT 04/14/2022, 11:50 AM

## 2022-04-19 ENCOUNTER — Ambulatory Visit: Payer: No Typology Code available for payment source | Admitting: Physical Therapy

## 2022-04-19 ENCOUNTER — Encounter: Payer: Self-pay | Admitting: Physical Therapy

## 2022-04-19 DIAGNOSIS — R600 Localized edema: Secondary | ICD-10-CM

## 2022-04-19 DIAGNOSIS — C50212 Malignant neoplasm of upper-inner quadrant of left female breast: Secondary | ICD-10-CM

## 2022-04-19 DIAGNOSIS — M6281 Muscle weakness (generalized): Secondary | ICD-10-CM

## 2022-04-19 DIAGNOSIS — Z170 Estrogen receptor positive status [ER+]: Secondary | ICD-10-CM

## 2022-04-19 DIAGNOSIS — Z483 Aftercare following surgery for neoplasm: Secondary | ICD-10-CM

## 2022-04-19 DIAGNOSIS — R293 Abnormal posture: Secondary | ICD-10-CM

## 2022-04-19 DIAGNOSIS — M25612 Stiffness of left shoulder, not elsewhere classified: Secondary | ICD-10-CM

## 2022-04-19 NOTE — Therapy (Signed)
OUTPATIENT PHYSICAL THERAPY ONCOLOGY TREATMENT  Patient Name: Kelli Vaughn MRN: [id number] DOB:May 24, 1956, 66 y.o., female Today's Date: 04/19/2022   PT End of Session - 04/19/22 1051     Visit Number 12    Number of Visits 15    Date for PT Re-Evaluation 04/26/22    PT Start Time 1005    PT Stop Time 1050    PT Time Calculation (min) 45 min    Activity Tolerance Patient tolerated treatment well    Behavior During Therapy WFL for tasks assessed/performed               Past Medical History:  Diagnosis Date   Complication of anesthesia    Diabetes mellitus without complication ([place])    TYPE II   Glaucoma    Hypertension    PONV (postoperative nausea and vomiting)    Past Surgical History:  Procedure Laterality Date   BREAST LUMPECTOMY WITH RADIOACTIVE SEED AND SENTINEL LYMPH NODE BIOPSY Left 09/21/2021   Procedure: LEFT BREAST LUMPECTOMY WITH RADIOACTIVE SEED X2 AND SENTINEL LYMPH NODE BIOPSY;  Surgeon: [name], MD;  Location: [facility];  Service: General;  Laterality: Left;   CESAREAN SECTION     X2   EYE SURGERY Bilateral 2019   cataract surgery   RADIOACTIVE SEED GUIDED AXILLARY SENTINEL LYMPH NODE Left 09/21/2021   Procedure: RADIOACTIVE SEED GUIDED AXILLARY SENTINEL LYMPH NODE BIOPSY;  Surgeon: [name], MD;  Location: [facility];  Service: General;  Laterality: Left;   Patient Active Problem List   Diagnosis Date Noted   Genetic testing 09/17/2021   Family history of breast cancer 09/01/2021   Malignant neoplasm of upper-inner quadrant of left breast in female, estrogen receptor positive ([place]) 08/31/2021    PCP: [name], MD  REFERRING PROVIDER: Benay Pike   REFERRING DIAG: C50.212,Z17.0 (ICD-10-CM) - Malignant neoplasm of upper-inner quadrant of left breast in female, estrogen receptor positive ([place])  THERAPY DIAG:  Stiffness of left shoulder, not elsewhere classified  Aftercare following surgery for neoplasm  Localized  edema  Muscle weakness (generalized)  Abnormal posture  Malignant neoplasm of upper-inner quadrant of left breast in female, estrogen receptor positive ([place])  ONSET DATE: 09/21/21  Rationale for Evaluation and Treatment Rehabilitation  SUBJECTIVE                                                                                                                                                                                        SUBJECTIVE STATEMENT: I am feeling much better.    PERTINENT HISTORY:  Patient was diagnosed on 05/20/2021 with left grade II invasive ductal carcinoma breast cancer. It measures 3.7  cm and is located in the upper inner quadrant. It is ER/PR positive and HER 2 negative with a Ki67 of 5%. 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 Surgery for left lumpectomy with SLNB was performed on 09/21/2021 with 1/5 LN's. Pt had a post op seroma  PAIN:  Are you having pain? No pain  PRECAUTIONS: Other: DM, hypertension, L UE lymphedema risk  WEIGHT BEARING RESTRICTIONS No  FALLS:  Has patient fallen in last 6 months? 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  HAND DOMINANCE : right   PRIOR LEVEL OF FUNCTION: Independent  PATIENT GOALS to feel better, less pain in breast   OBJECTIVE COGNITION:  Overall cognitive status: Within functional limits for tasks assessed   PALPATION: Increased fibrosis surrounding lumpectomy scar  OBSERVATIONS / OTHER ASSESSMENTS: discoloration in inferior breast  POSTURE: forward head and rounded shoulders  UPPER EXTREMITY AROM/PROM:  A/PROM RIGHT   eval   Shoulder extension 65  Shoulder flexion 172  Shoulder abduction  168  Shoulder internal rotation 38  Shoulder external rotation 90    (Blank rows = not tested)  A/PROM LEFT   eval  Shoulder extension 57  Shoulder flexion 165  Shoulder abduction 150  Shoulder internal rotation 65  Shoulder external rotation 90    (Blank rows = not tested)   LYMPHEDEMA ASSESSMENTS:  SURGERY TYPE/DATE: left lumpectomy with SLNB 09/21/21 NUMBER OF LYMPH NODES REMOVED: 1/5 CHEMOTHERAPY: none RADIATION:completed HORMONE TREATMENT: currently on anastrozole INFECTIONS: none  LYMPHEDEMA ASSESSMENTS:   LANDMARK RIGHT  eval  10 cm proximal to olecranon process 30.5  Olecranon process 27  10 cm proximal to ulnar styloid process 23  Just proximal to ulnar styloid process 18  Across hand at thumb web space 19.7  At base of 2nd digit 6.3  (Blank rows = not tested)  LANDMARK LEFT  eval  10 cm proximal to olecranon process 34.5  Olecranon process 27  10 cm proximal to ulnar styloid process 21.7  Just proximal to ulnar styloid process 17.4  Across hand at thumb web space 18.6  At base of 2nd digit 6  (Blank rows = not tested)   BREAST COMPLAINTS SCALE: 27   TODAY'S TREATMENT  04/19/2022  Manual Therapy MFR to Left axilla in overhead position. No cording palpable today PROM left shoulder flexion, abd, and ERwith MFR to axilla region MLD: In Supine with pt permission granted first: Short neck, 5 diaphragmatic breaths, Lt inguinal and Rt axillary nodes, then Lt axillo-inguinal and anterior inter-axillary anastomosis, then focused on medial aspect of Lt breast where scar tissue and some fibrosis palpated  04/14/2022  Manual Therapy Soft tissue mobilization with cocoa butter to left clavicular, and axillary border of pectorals and left lateral trunk MFR to Left axilla in overhead position. Cording noted today and is tender PROM left shoulder flexion, abd, d2 flexion with MFR to axilla region MLD: In Supine with pt permission granted first: Short neck, 5  diaphragmatic breaths, Lt inguinal and Rt axillary nodes, then Lt axillo-inguinal and anterior inter-axillary anastomosis, then focused on medial aspect of Lt breast where scar tissue and some fibrosis palpated  04/12/22: Therapeutic Exercises Completed instruction of strength ABC Program having pt return demo of  each, 5 reps each side. Tactile and VCs throughout for correct technique and posture as well Manual Therapy MFR to Left axilla in overhead position MLD: In Supine with pt permission granted first: Short neck, 5 diaphragmatic breaths, Lt inguinal and Rt axillary nodes, then Lt axillo-inguinal and anterior inter-axillary anastomosis, then focused on medial aspect of Lt breast where scar tissue and some fibrosis palpated P/ROM to Lt shoulder flexion, abduction, and D2 in pts available end motion with scapular depression throughout  04/07/22:  Began instructing pt in Strength ABC program today. Highlighted the most importance information and educated pt on how to properly progress reps and weight without increasing risk of lymphedema. Did not have a Spanish copy for her but she reports her son is able to translate it for her. Instructed pt in all stretches and educated her to hold them for 5 secs. Had her return demonstrate each stretch and provided verbal and tactile cues as needed. Instructed pt in superwoman but educated her to begin with only 3 to 5 reps on each side instead of 10 since this was challenging for her. Substituted pelvic tilts for crunches and had interpreter write down in spanish correct instructions for this was pt completed 10 of these. She required mod v/c and t/c to perform correctly. Instructed pt in core exercises and strengthening exercises until chest press. Pt to practice the program until this point and will educate her in the rest at next session.    PATIENT EDUCATION:  Education details:Strength ABC Program Person educated: Patient with interpreter Education method:  Explanation, Demonstration and Handout Education comprehension: verbalized understanding, returned demonstration, needs further review   HOME EXERCISE PROGRAM: Walking program  ASSESSMENT: CLINICAL IMPRESSION: Pt reports her pain is improving and the medial aspect of her breast is not as sore. Continued with MLD and scar mobilization to L breast to improve comfort. Cording was not palpable today during session and pt did not have limitations with ROM.   OBJECTIVE IMPAIRMENTS decreased knowledge of condition, decreased ROM, increased edema, increased fascial restrictions, postural dysfunction, and pain.   ACTIVITY LIMITATIONS  none reported   PARTICIPATION LIMITATIONS:  pt just limited in daily activities due to need for recovery periods due to [place] Time since onset of injury/illness/exacerbation are also affecting patient's functional outcome.   REHAB POTENTIAL: Good  CLINICAL DECISION MAKING: Stable/uncomplicated  EVALUATION COMPLEXITY: Low  GOALS: Goals reviewed with patient? Yes  SHORT TERM GOALS=LONG TERM GOALS  Target date: 03/30/2022    Pt will demonstrate 168 degrees of L shoulder abduction to allow her to reach out to the side.  Baseline: Goal status: MET 03/29/22 - 168 deg  2.  Pt will report a 75% improvement in pain and discomfort in L breast to allow improved comfort.  Baseline:  Goal status: MET 03/29/22- 75% improved  3.  Pt will be independent in Strength ABC program for continued stretching and strengthening at home.  Baseline:  Goal status: IN PROGRESS  4.  Pt will be independent in scar mobilization to decrease discomfort in L breast.  Baseline:  Goal status: MET 03/29/22  5. Pt will report a 50% improvement in fibrosis in area of lumpectomy scar to allow improved comfort.  Baseline:   Goal Status: NEW    PLAN: PT FREQUENCY: 2x/week  PT DURATION: 4 weeks  PLANNED INTERVENTIONS: Therapeutic exercises, Therapeutic activity,  Patient/Family education, Self Care, Joint mobilization, Manual lymph drainage, scar mobilization, Taping, and Manual therapy  PLAN FOR NEXT SESSION:  Review Strength ABC prn; did pt get to try?  cont MFR to tightness Lt axilla, scar mobilization to lumpectomy scar, PROM to L shoulder, MLD to L breast   Manus Gunning, PT 04/19/2022, 10:54 AM

## 2022-04-21 ENCOUNTER — Ambulatory Visit: Payer: No Typology Code available for payment source | Admitting: Physical Therapy

## 2022-04-26 ENCOUNTER — Ambulatory Visit: Payer: No Typology Code available for payment source | Admitting: Physical Therapy

## 2022-05-02 ENCOUNTER — Ambulatory Visit: Payer: No Typology Code available for payment source

## 2022-05-03 ENCOUNTER — Encounter: Payer: Self-pay | Admitting: Physical Therapy

## 2022-05-03 ENCOUNTER — Ambulatory Visit: Payer: No Typology Code available for payment source | Admitting: Physical Therapy

## 2022-05-03 DIAGNOSIS — C50212 Malignant neoplasm of upper-inner quadrant of left female breast: Secondary | ICD-10-CM

## 2022-05-03 DIAGNOSIS — M25612 Stiffness of left shoulder, not elsewhere classified: Secondary | ICD-10-CM

## 2022-05-03 DIAGNOSIS — Z483 Aftercare following surgery for neoplasm: Secondary | ICD-10-CM

## 2022-05-03 DIAGNOSIS — M6281 Muscle weakness (generalized): Secondary | ICD-10-CM

## 2022-05-03 DIAGNOSIS — R600 Localized edema: Secondary | ICD-10-CM

## 2022-05-03 DIAGNOSIS — R293 Abnormal posture: Secondary | ICD-10-CM

## 2022-05-03 NOTE — Therapy (Signed)
OUTPATIENT PHYSICAL THERAPY ONCOLOGY TREATMENT  Patient Name: Kelli Vaughn MRN: [id number] DOB:1955-11-24, 66 y.o., female Today's Date: 05/03/2022   PT End of Session - 05/03/22 1058     Visit Number 13    Number of Visits 15    Date for PT Re-Evaluation 04/26/22    PT Start Time 1014   therapist had SOZO screening at 19   PT Stop Time 1053    PT Time Calculation (min) 39 min    Activity Tolerance Patient tolerated treatment well    Behavior During Therapy WFL for tasks assessed/performed                Past Medical History:  Diagnosis Date   Complication of anesthesia    Diabetes mellitus without complication ([place])    TYPE II   Glaucoma    Hypertension    PONV (postoperative nausea and vomiting)    Past Surgical History:  Procedure Laterality Date   BREAST LUMPECTOMY WITH RADIOACTIVE SEED AND SENTINEL LYMPH NODE BIOPSY Left 09/21/2021   Procedure: LEFT BREAST LUMPECTOMY WITH RADIOACTIVE SEED X2 AND SENTINEL LYMPH NODE BIOPSY;  Surgeon: [name], MD;  Location: [facility];  Service: General;  Laterality: Left;   CESAREAN SECTION     X2   EYE SURGERY Bilateral 2019   cataract surgery   RADIOACTIVE SEED GUIDED AXILLARY SENTINEL LYMPH NODE Left 09/21/2021   Procedure: RADIOACTIVE SEED GUIDED AXILLARY SENTINEL LYMPH NODE BIOPSY;  Surgeon: [name], MD;  Location: [facility];  Service: General;  Laterality: Left;   Patient Active Problem List   Diagnosis Date Noted   Genetic testing 09/17/2021   Family history of breast cancer 09/01/2021   Malignant neoplasm of upper-inner quadrant of left breast in female, estrogen receptor positive ([place]) 08/31/2021    PCP: [name], MD  REFERRING PROVIDER: Benay Pike   REFERRING DIAG: C50.212,Z17.0 (ICD-10-CM) - Malignant neoplasm of upper-inner quadrant of left breast in female, estrogen receptor positive ([place])  THERAPY DIAG:  Stiffness of left shoulder, not elsewhere classified  Aftercare following  surgery for neoplasm  Localized edema  Muscle weakness (generalized)  Abnormal posture  Malignant neoplasm of upper-inner quadrant of left breast in female, estrogen receptor positive ([place])  ONSET DATE: 09/21/21  Rationale for Evaluation and Treatment Rehabilitation  SUBJECTIVE                                                                                                                                                                                        SUBJECTIVE STATEMENT: I am feeling much better. I was not feeling well last time and I went to the doctor and I  had covid.    PERTINENT HISTORY:  Patient was diagnosed on 05/20/2021 with left grade II invasive ductal carcinoma breast cancer. It measures 3.7 cm and is located in the upper inner quadrant. It is ER/PR positive and HER 2 negative with a Ki67 of 5%. 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 Surgery for left lumpectomy with SLNB was performed on 09/21/2021 with 1/5 LN's. Pt had a post op seroma  PAIN:  Are you having pain? No pain  PRECAUTIONS: Other: DM, hypertension, L UE lymphedema risk  WEIGHT BEARING RESTRICTIONS No  FALLS:  Has patient fallen in last 6 months? 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  HAND DOMINANCE : right   PRIOR LEVEL OF FUNCTION: Independent  PATIENT GOALS to feel better, less pain in breast   OBJECTIVE COGNITION:  Overall cognitive status: Within functional limits for tasks assessed   PALPATION: Increased fibrosis surrounding lumpectomy scar  OBSERVATIONS / OTHER ASSESSMENTS: discoloration in inferior breast  POSTURE: forward head and rounded shoulders  UPPER  EXTREMITY AROM/PROM:  A/PROM RIGHT   eval   Shoulder extension 65  Shoulder flexion 172  Shoulder abduction 168  Shoulder internal rotation 38  Shoulder external rotation 90    (Blank rows = not tested)  A/PROM LEFT   eval  Shoulder extension 57  Shoulder flexion 165  Shoulder abduction 150  Shoulder internal rotation 65  Shoulder external rotation 90    (Blank rows = not tested)   LYMPHEDEMA ASSESSMENTS:  SURGERY TYPE/DATE: left lumpectomy with SLNB 09/21/21 NUMBER OF LYMPH NODES REMOVED: 1/5 CHEMOTHERAPY: none RADIATION:completed HORMONE TREATMENT: currently on anastrozole INFECTIONS: none  LYMPHEDEMA ASSESSMENTS:   LANDMARK RIGHT  eval  10 cm proximal to olecranon process 30.5  Olecranon process 27  10 cm proximal to ulnar styloid process 23  Just proximal to ulnar styloid process 18  Across hand at thumb web space 19.7  At base of 2nd digit 6.3  (Blank rows = not tested)  LANDMARK LEFT  eval  10 cm proximal to olecranon process 34.5  Olecranon process 27  10 cm proximal to ulnar styloid process 21.7  Just proximal to ulnar styloid process 17.4  Across hand at thumb web space 18.6  At base of 2nd digit 6  (Blank rows = not tested)   BREAST COMPLAINTS SCALE: 27   TODAY'S TREATMENT  05/03/2022  Manual Therapy MFR to Left axilla in overhead position. No cording palpable today but some mild tightness MLD: In Supine with pt permission granted first: Brief MLD to L lateral trunk moving fluid towards inguinal nodes  L-DEX FLOWSHEETS - 05/03/22 1000       L-DEX LYMPHEDEMA SCREENING   Measurement Type Unilateral    L-DEX MEASUREMENT EXTREMITY Upper Extremity    POSITION  Standing    DOMINANT SIDE Right    At Risk Side Left    BASELINE SCORE (UNILATERAL) 1.5    L-DEX SCORE (UNILATERAL) 3.5    VALUE CHANGE (UNILAT) 2             The patient was assessed using the L-Dex machine today to produce a lymphedema index baseline score. The patient will  be reassessed on a regular basis (typically every 3  months) to obtain new L-Dex scores. If the score is > 6.5 points away from his/her baseline score indicating onset of subclinical lymphedema, it will be recommended to wear a compression garment for 4 weeks, 12 hours per day and then be reassessed. If the score continues to be > 6.5 points from baseline at reassessment, we will initiate lymphedema treatment. Assessing in this manner has a 95% rate of preventing clinically significant lymphedema.  04/19/2022  Manual Therapy MFR to Left axilla in overhead position. No cording palpable today PROM left shoulder flexion, abd, and ERwith MFR to axilla region MLD: In Supine with pt permission granted first: Short neck, 5 diaphragmatic breaths, Lt inguinal and Rt axillary nodes, then Lt axillo-inguinal and anterior inter-axillary anastomosis, then focused on medial aspect of Lt breast where scar tissue and some fibrosis palpated  04/14/2022  Manual Therapy Soft tissue mobilization with cocoa butter to left clavicular, and axillary border of pectorals and left lateral trunk MFR to Left axilla in overhead position. Cording noted today and is tender PROM left shoulder flexion, abd, d2 flexion with MFR to axilla region MLD: In Supine with pt permission granted first: Short neck, 5 diaphragmatic breaths, Lt inguinal and Rt axillary nodes, then Lt axillo-inguinal and anterior inter-axillary anastomosis, then focused on medial aspect of Lt breast where scar tissue and some fibrosis palpated  04/12/22: Therapeutic Exercises Completed instruction of strength ABC Program having pt return demo of each, 5 reps each side. Tactile and VCs throughout for correct technique and posture as well Manual Therapy MFR to Left axilla in overhead position MLD: In Supine with pt permission granted first: Short neck, 5 diaphragmatic breaths, Lt inguinal and Rt axillary nodes, then Lt axillo-inguinal and anterior inter-axillary  anastomosis, then focused on medial aspect of Lt breast where scar tissue and some fibrosis palpated P/ROM to Lt shoulder flexion, abduction, and D2 in pts available end motion with scapular depression throughout  04/07/22:  Began instructing pt in Strength ABC program today. Highlighted the most importance information and educated pt on how to properly progress reps and weight without increasing risk of lymphedema. Did not have a Spanish copy for her but she reports her son is able to translate it for her. Instructed pt in all stretches and educated her to hold them for 5 secs. Had her return demonstrate each stretch and provided verbal and tactile cues as needed. Instructed pt in superwoman but educated her to begin with only 3 to 5 reps on each side instead of 10 since this was challenging for her. Substituted pelvic tilts for crunches and had interpreter write down in spanish correct instructions for this was pt completed 10 of these. She required mod v/c and t/c to perform correctly. Instructed pt in core exercises and strengthening exercises until chest press. Pt to practice the program until this point and will educate her in the rest at next session.    PATIENT EDUCATION:  Education details:Strength ABC Program Person educated: Patient with interpreter Education method: Explanation, Demonstration and Handout Education comprehension: verbalized understanding, returned demonstration, needs further review   HOME EXERCISE PROGRAM: Walking program  ASSESSMENT: CLINICAL IMPRESSION: Assessed pt's progress towards her goals. She has now met all of her goals for therapy and is ready to be discharged. Educated pt to continue to stretch at end range and to do massage to her axilla in area of scar tissue. Reassessed her SOZO score today and it has decreased and is back in the green so there are no  signs of subclinical lymphedema. Pt will be discharged from skilled PT services at this time.    OBJECTIVE IMPAIRMENTS decreased knowledge of condition, decreased ROM, increased edema, increased fascial restrictions, postural dysfunction, and pain.   ACTIVITY LIMITATIONS  none reported   PARTICIPATION LIMITATIONS:  pt just limited in daily activities due to need for recovery periods due to [place] Time since onset of injury/illness/exacerbation are also affecting patient's functional outcome.   REHAB POTENTIAL: Good  CLINICAL DECISION MAKING: Stable/uncomplicated  EVALUATION COMPLEXITY: Low  GOALS: Goals reviewed with patient? Yes  SHORT TERM GOALS=LONG TERM GOALS  Target date: 03/30/2022    Pt will demonstrate 168 degrees of L shoulder abduction to allow her to reach out to the side.  Baseline: Goal status: MET 03/29/22 - 168 deg  2.  Pt will report a 75% improvement in pain and discomfort in L breast to allow improved comfort.  Baseline:  Goal status: MET 03/29/22- 75% improved  3.  Pt will be independent in Strength ABC program for continued stretching and strengthening at home.  Baseline:  Goal status: MET 05/03/22  4.  Pt will be independent in scar mobilization to decrease discomfort in L breast.  Baseline:  Goal status: MET 03/29/22  5. Pt will report a 50% improvement in fibrosis in area of lumpectomy scar to allow improved comfort.  Baseline:   Goal Status: MET 05/03/22 90% improvement    PLAN: PT FREQUENCY: 2x/week  PT DURATION: 4 weeks  PLANNED INTERVENTIONS: Therapeutic exercises, Therapeutic activity, Patient/Family education, Self Care, Joint mobilization, Manual lymph drainage, scar mobilization, Taping, and Manual therapy  PLAN FOR NEXT SESSION: D/C; continue L dex screenings every 3 months   [employer], PT 05/03/2022, 11:00 AM  PHYSICAL THERAPY DISCHARGE SUMMARY  Visits from Start of Care: 13  Current functional level related to goals / functional outcomes: All goals met   Remaining deficits: None    Education / Equipment: HEP, MLD, compression bra   Patient agrees to discharge. Patient goals were met. Patient is being discharged due to meeting the stated rehab goals.  [employer], PT 05/03/22 11:00 AM

## 2022-05-05 ENCOUNTER — Ambulatory Visit: Payer: No Typology Code available for payment source

## 2022-06-16 ENCOUNTER — Other Ambulatory Visit: Payer: Self-pay

## 2022-06-16 DIAGNOSIS — C50212 Malignant neoplasm of upper-inner quadrant of left female breast: Secondary | ICD-10-CM

## 2022-06-17 ENCOUNTER — Inpatient Hospital Stay: Payer: No Typology Code available for payment source | Attending: Hematology and Oncology

## 2022-07-21 ENCOUNTER — Other Ambulatory Visit: Payer: Self-pay

## 2022-07-21 DIAGNOSIS — Z853 Personal history of malignant neoplasm of breast: Secondary | ICD-10-CM

## 2022-08-16 ENCOUNTER — Ambulatory Visit: Payer: Self-pay | Admitting: *Deleted

## 2022-08-16 ENCOUNTER — Ambulatory Visit
Admission: RE | Admit: 2022-08-16 | Discharge: 2022-08-16 | Disposition: A | Discharge status: Home / Self Care | Payer: No Typology Code available for payment source | Source: Ambulatory Visit | Attending: Obstetrics and Gynecology | Admitting: Obstetrics and Gynecology

## 2022-08-16 VITALS — BP 149/72 | Wt 192.0 lb

## 2022-08-16 DIAGNOSIS — Z853 Personal history of malignant neoplasm of breast: Secondary | ICD-10-CM

## 2022-08-16 DIAGNOSIS — Z1211 Encounter for screening for malignant neoplasm of colon: Secondary | ICD-10-CM

## 2022-08-16 DIAGNOSIS — Z1239 Encounter for other screening for malignant neoplasm of breast: Secondary | ICD-10-CM

## 2022-08-16 HISTORY — DX: Malignant neoplasm of unspecified site of unspecified female breast: C50.919

## 2022-08-16 HISTORY — DX: Personal history of irradiation: Z92.3

## 2022-08-16 NOTE — Progress Notes (Addendum)
Kelli Vaughn is a 67 y.o. female who presents to [facility] clinic today with complaint of left breast sensitivity. Patient has history of left breast cancer with surgery and radiation. Patient denies pain today.   Pap Smear: Pap smear not completed today. Last Pap smear was 09/08/2021 at the free cervical cancer screening clinic and was normal with negative HPV. Per patient has no history of an abnormal Pap smear. Last Pap smear result is available in Epic.   Physical exam: Breasts Breasts symmetrical. Observed scar left inner breast from history of lumpectomy for breast cancer. Skin darkened left lower breast due to history of radiation treatment for breast cancer. Observed a scar on the right upper outer quadrant of right breast that patient is unsure what happened and a darkened area at 3 o'clock 10 cm from the nipple that patient says is itchy at times. No nipple retraction bilateral breasts. No nipple discharge bilateral breasts. No lymphadenopathy. No lumps palpated bilateral breasts. No complaints of pain or tenderness on exam.   MS DIGITAL DIAG TOMO BILAT  Result Date: 08/16/2022 CLINICAL DATA:  67 year old female for annual follow-up. History of LEFT breast cancer and lumpectomy in 2023. EXAM: DIGITAL DIAGNOSTIC BILATERAL MAMMOGRAM WITH TOMOSYNTHESIS TECHNIQUE: Bilateral digital diagnostic mammography and breast tomosynthesis was performed. COMPARISON:  Previous exam(s). ACR Breast Density Category b: There are scattered areas of fibroglandular density. FINDINGS: 2D and 3D full field views of both breasts and a magnification view of the lumpectomy site demonstrate no suspicious mass, nonsurgical distortion or worrisome calcifications. Surgical changes in the LEFT breast and LEFT axilla are noted. IMPRESSION: No evidence of breast malignancy. RECOMMENDATION: Bilateral diagnostic mammogram in 1 year. I have discussed the findings and recommendations with the patient. If applicable, a  reminder letter will be sent to the patient regarding the next appointment. BI-RADS CATEGORY  2: Benign. Electronically Signed   By: Margarette Canada M.D.   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 Radioactive seed localization was performed yesterday in anticipation of today's lumpectomy and targeted node dissection. EXAM: SPECIMEN RADIOGRAPHS OF THE LEFT BREAST and LEFT AXILLA COMPARISON:  Previous exam(s). FINDINGS: Status post excision of the LEFT breast and status post targeted LEFT axillary node dissection. The radioactive seeds and the ribbon shaped biopsy marker clip are present in the breast specimen, are completely intact, and are marked for pathology. The Tribell clip and the radioactive seed are present in the axillary node specimen, are completely intact, and are marked pathology. IMPRESSION: Specimen radiographs of the LEFT breast and LEFT axilla. Electronically Signed   By: Evangeline Dakin M.D.   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 Radioactive seed localization was performed yesterday in anticipation of today's lumpectomy and targeted node dissection. EXAM: SPECIMEN RADIOGRAPHS OF THE LEFT BREAST and LEFT AXILLA COMPARISON:  Previous exam(s). FINDINGS: Status post excision of the LEFT breast and status post targeted LEFT axillary node dissection. The radioactive seeds and the ribbon shaped biopsy marker clip are present in the breast specimen, are completely intact, and are marked for pathology. The Tribell clip and the radioactive seed  are  present in the axillary node specimen, are completely intact, and are marked pathology. IMPRESSION: Specimen radiographs of the LEFT breast and LEFT axilla. Electronically Signed   By: Evangeline Dakin M.D.   On: 09/22/2021 13:42  MM Breast Surgical Specimen  Result Date: 09/21/2021 CLINICAL DATA:  Assess surgical excision specimen following radioactive seed localization of left breast calcifications. Seeds replaced along the anterior-posterior extents bracketing the area of calcifications. EXAM: SPECIMEN RADIOGRAPH OF THE LEFT BREAST COMPARISON:  Previous exam(s). FINDINGS: Status post excision of the left breast. Both radioactive seeds and the ribbon shaped biopsy marker clip are present, completely intact, and were marked for pathology. IMPRESSION: Specimen radiograph of the left breast. Electronically Signed   By: Lajean Manes M.D.   On: 09/21/2021 16:26  MM LT RADIOACTIVE SEED LOC MAMMO GUIDE  Result Date: 09/20/2021 CLINICAL DATA:  Patient presents for bracketed seed localization of the LEFT breast prior to lumpectomy. EXAM: MAMMOGRAPHIC GUIDED RADIOACTIVE SEED LOCALIZATION OF THE LEFT BREASTx 2 COMPARISON:  Previous exam(s). FINDINGS: Patient presents for radioactive seed localization prior to lumpectomy. I met with the patient and we discussed the procedure of seed localization including benefits and alternatives. We discussed the high likelihood of a successful procedure. We discussed the risks of the procedure including infection, bleeding, tissue injury and further surgery. We discussed the low dose of radioactivity involved in the procedure. Informed, written consent was given. The usual time-out protocol was performed immediately prior to the procedure. An interpreter was present for the entire procedure. Site1: Using mammographic guidance, sterile technique, 1% lidocaine and an I-125 radioactive seed, the anterior aspect of the pleomorphic calcifications was localized using a MEDIAL approach.  The follow-up mammogram images confirm the seed in the expected location and were marked for Dr. Barry Dienes. Follow-up survey of the patient confirms presence of the radioactive seed. Order number of I-125 seed:  [id number]. Total activity:  2.505 millicuries reference Date: 02/03/2022 Site 2: Using mammographic guidance, sterile technique, 1% lidocaine and an I-125 radioactive seed, the posterior aspect of the pleomorphic calcifications was localized using a MEDIAL approach. The follow-up mammogram images confirm the seed in the expected location and were marked for Dr. Barry Dienes. Follow-up survey of the patient confirms presence of the radioactive seed. Order number of I-125 seed:  [id number]. Total activity:  3.790 millicuries reference Date: 09/06/2021 The patient tolerated the procedure well and was released from the [facility]. She was given instructions regarding seed removal. Of note, linear calcifications are present posterior to the posterior seed. More posterior localization was not possible due to technical considerations. IMPRESSION: Bracketed radioactive seed localization LEFT breast. Suspicious calcifications extend posterior to the more posterior seed. See above. No apparent complications. Electronically Signed   By: Nolon Nations M.D.   On: 09/20/2021 14:59  MM LT RAD SEED EA ADD LESION LOC MAMMO  Result Date: 09/20/2021 CLINICAL DATA:  Patient presents for bracketed seed localization of the LEFT breast prior to lumpectomy. EXAM: MAMMOGRAPHIC GUIDED RADIOACTIVE SEED LOCALIZATION OF THE LEFT BREASTx 2 COMPARISON:  Previous exam(s). FINDINGS: Patient presents for radioactive seed localization prior to lumpectomy. I met with the patient and we discussed the procedure of seed localization including benefits and alternatives. We discussed the high likelihood of a successful procedure. We discussed the risks of the procedure including infection, bleeding, tissue injury and further surgery. We discussed  the low dose of radioactivity involved in the procedure. Informed, written consent was given. The usual time-out protocol was performed immediately prior to  the procedure. An interpreter was present for the entire procedure. Site1: Using mammographic guidance, sterile technique, 1% lidocaine and an I-125 radioactive seed, the anterior aspect of the pleomorphic calcifications was localized using a MEDIAL approach. The follow-up mammogram images confirm the seed in the expected location and were marked for Dr. Barry Dienes. Follow-up survey of the patient confirms presence of the radioactive seed. Order number of I-125 seed:  [id number]. Total activity:  0.254 millicuries reference Date: 02/03/2022 Site 2: Using mammographic guidance, sterile technique, 1% lidocaine and an I-125 radioactive seed, the posterior aspect of the pleomorphic calcifications was localized using a MEDIAL approach. The follow-up mammogram images confirm the seed in the expected location and were marked for Dr. Barry Dienes. Follow-up survey of the patient confirms presence of the radioactive seed. Order number of I-125 seed:  [id number]. Total activity:  8.315 millicuries reference Date: 09/06/2021 The patient tolerated the procedure well and was released from the [facility]. She was given instructions regarding seed removal. Of note, linear calcifications are present posterior to the posterior seed. More posterior localization was not possible due to technical considerations. IMPRESSION: Bracketed radioactive seed localization LEFT breast. Suspicious calcifications extend posterior to the more posterior seed. See above. No apparent complications. Electronically Signed   By: Nolon Nations M.D.   On: 09/20/2021 14:59  MM CLIP PLACEMENT LEFT  Result Date: 08/26/2021 CLINICAL DATA:  Post ultrasound-guided biopsy of a mass with associated calcifications in the left breast at the 9:30 position and ultrasound-guided biopsy of an abnormal lymph node in  the left axilla. EXAM: 3D DIAGNOSTIC LEFT MAMMOGRAM POST ULTRASOUND BIOPSY COMPARISON:  Previous exam(s). FINDINGS: 3D Mammographic images were obtained following ultrasound-guided biopsy of a mass with associated calcifications in the left breast at the 9:30 position and ultrasound-guided biopsy of an abnormal lymph node in the left axilla. A ribbon shaped biopsy marking clip is present at the site of the biopsied mass with associated calcifications in the left breast at the 9:30 position. A tri bell biopsy marking clip is present at the site of the biopsied lymph node in the left axilla. IMPRESSION: 1. Ribbon shaped biopsy marking clip at site of biopsied mass in the left breast at the 9:30 position. 2. Tri bell shaped biopsy marking clip at site of biopsied lymph node in the left axilla. Final Assessment: Post Procedure Mammograms for Marker Placement Electronically Signed   By: Everlean Alstrom M.D.   On: 08/26/2021 13:56  MM DIAG BREAST TOMO UNI LEFT  Result Date: 08/12/2021 CLINICAL DATA:  Patient was called back for evaluation of LEFT breast calcifications, focal asymmetry, and possible enlarged LEFT axillary lymph node from screening mammogram at [place] on 05/20/2021. Patient was seen at the [facility] on 12/25/2019 for mass in the 9:30 o'clock location of the LEFT breast. Biopsy was recommended for this mass. 2 LEFT axillary lymph nodes with abnormal morphology were also identified at the time of that exam and biopsy was recommended of one of these lymph nodes. Biopsy has not been performed. EXAM: DIGITAL DIAGNOSTIC UNILATERAL LEFT MAMMOGRAM WITH TOMOSYNTHESIS AND CAD; ULTRASOUND LEFT BREAST LIMITED TECHNIQUE: Left digital diagnostic mammography and breast tomosynthesis was performed. The images were evaluated with computer-aided detection.; Targeted ultrasound examination of the left breast was performed. COMPARISON:  Multiple prior studies including 05/20/2021  ([place]), 12/25/2019 and earlier (from the [facility]) ACR Breast Density Category b: There are scattered areas of fibroglandular density. FINDINGS: Spot compression and magnified  views are performed of the LEFT breast. These views demonstrate a spiculated mass associated with linear and pleomorphic calcifications in the UPPER INNER QUADRANT of the LEFT breast. The calcifications and mass together span 3.7 x 1.8 x 1.2 centimeters. Targeted ultrasound is performed, showing an oval mass with indistinct margins and posterior acoustic shadowing in the 9:30 o'clock location of the LEFT breast 5 centimeters from the nipple. No internal blood flow identified on Doppler evaluation. Mass measures 0.7 x 0.9 x 0.5 centimeters. At real-time imaging, there is associated distortion in this region. Evaluation of the LEFT axilla shows 2 morphologically abnormal lymph nodes, 1 with cortical thickening of 1.0 centimeters. A second lymph node has cortical thickening of 3 millimeters. Other LEFT axillary lymph nodes have normal morphology. IMPRESSION: 1. Suspicious mass in the 9:30 o'clock location of the LEFT breast warranting tissue diagnosis. 2. Two LEFT axillary lymph nodes with abnormal morphology. RECOMMENDATION: 1. Recommend ultrasound-guided core biopsy of mass in the 9:30 o'clock location of the LEFT breast. Given the associated suspicious calcifications in this region, I would recommend bracketed localization to include calcifications if excision is needed. 2. Recommend ultrasound-guided core biopsy of the larger of the LEFT axillary lymph nodes. I have discussed the findings and recommendations with the patient with the assistance of an interpreter. If applicable, a reminder letter will be sent to the patient regarding the next appointment. BI-RADS CATEGORY  5: Highly suggestive of malignancy. Electronically Signed   By: Nolon Nations M.D.   On: 08/12/2021 16:25   MM DIAG BREAST  TOMO UNI LEFT  Result Date: 12/25/2019 CLINICAL DATA:  Patient recalled from screening for left breast mass/distortion and calcifications. EXAM: DIGITAL DIAGNOSTIC LEFT MAMMOGRAM WITH CAD AND TOMO ULTRASOUND LEFT BREAST COMPARISON:  Previous exam(s). ACR Breast Density Category b: There are scattered areas of fibroglandular density. FINDINGS: Spot compression views and magnification views of the left breast were obtained. Within the upper inner left breast there is a persistent irregular mass with associated distortion. Extending anterior and lateral from this mass are suspicious pleomorphic calcifications measuring approximately 3.0 x 1.6 cm in dimension. Mammographic images were processed with CAD. Targeted ultrasound is performed, showing a 6 x 9 x 5 mm irregular hypoechoic mass left breast 9:30 o'clock 5 cm from nipple. There are 2 cortically thickened nodes within the left axilla, the largest demonstrating a cortex of 11 mm. IMPRESSION: 1. Suspicious mass left breast 9:30 o'clock 5 cm from the nipple corresponding with focal area of distortion on mammography. 2. There are suspicious pleomorphic calcifications within the upper inner left breast at the site of the above described mass extending anterior and lateral from the mass. RECOMMENDATION: 1. Ultrasound-guided core needle biopsy left breast mass 9:30 o'clock. 2. Stereotactic guided core needle biopsy of the anterior lateral most aspect of the pleomorphic calcifications within the upper inner left breast at the site of suspicious mass. 3. Ultrasound-guided core needle biopsy of 1 of the cortically thickened left axillary lymph nodes. I have discussed the findings and recommendations with the patient. If applicable, a reminder letter will be sent to the patient regarding the next appointment. BI-RADS CATEGORY  5: Highly suggestive of malignancy. Electronically Signed   By: Lovey Newcomer M.D.   On: 12/25/2019 12:44   MS DIGITAL SCREENING TOMO  BILATERAL  Addendum Date: 12/19/2019   ADDENDUM REPORT: 12/19/2019 13:02 ADDENDUM: The possible distortion associated with calcifications and possible mass is in the LEFT breast. I incorrectly stated the right breast on the report of  the screening mammogram. I also now have available the tomosynthesis MLO images which were not available at the time of initial interpretation. These do not need to be repeated at the time of the diagnostic mammogram. Electronically Signed   By: Evangeline Dakin M.D.   On: 12/19/2019 13:02   Result Date: 12/19/2019 CLINICAL DATA:  Screening. Please note that the LEFT MLO tomosynthesis images are currently unavailable due to technical difficulties, though all of the remaining images, including the synthesized LEFT MLO image, is available. EXAM: DIGITAL SCREENING BILATERAL MAMMOGRAM WITH TOMO AND CAD COMPARISON:  01/20/2012. ACR Breast Density Category b: There are scattered areas of fibroglandular density. FINDINGS: In the right breast, possible distortion associated with calcifications and associated with a possible mass warrants further evaluation. In the left breast, no findings suspicious for malignancy. Images were processed with CAD. IMPRESSION: Further evaluation is suggested for possible distortion associated with calcifications and associated with a possible mass in the right breast. RECOMMENDATION: Diagnostic mammogram and possibly ultrasound of the right breast. The LEFT MLO tomosynthesis images can repeated at the time of the diagnostic mammogram. (Code:[id number]) The patient will be contacted regarding the findings, and additional imaging will be scheduled. BI-RADS CATEGORY  0: Incomplete. Need additional imaging evaluation and/or prior mammograms for comparison. Electronically Signed: By: Evangeline Dakin M.D. On: 12/13/2019 16:10        Pelvic/Bimanual Pap is not indicated today per BCCCP guidelines.   Smoking History: Patient has never smoked.   Patient  Navigation: Patient education provided. Access to services provided for patient through [facility] program. Spanish interpreter Rudene Anda from [facility] [employer] [facility] provided.   Colorectal Cancer Screening: Per patient had a colonoscopy completed in 2010. FIT Test given to patient to complete. No complaints today.      Breast and Cervical Cancer Risk Assessment: Patient has a family history of her sister having breast cancer. Patient has a personal history of breast cancer. Patient has no known genetic mutations or history of radiation treatment to the chest before age 65. Patient has no history of cervical dysplasia, immunocompromised, or DES exposure in-utero.   A: BCCCP exam without pap smear Complaints of left breast sensitivity.  P: Referred patient to the [facility] for a diagnostic mammogram. Appointment scheduled Tuesday, August 16, 2022 at 1240.  Loletta Parish, RN 08/16/2022 9:55 AM

## 2022-08-16 NOTE — Patient Instructions (Addendum)
Explained breast self awareness with [name]. Patient did not need a Pap smear today due to last Pap smear and HPV typing was 09/08/2021. Let her know BCCCP will cover Pap smears and HPV typing every 5 years unless has a history of abnormal Pap smears. Referred patient to the [facility] for a diagnostic mammogram. Appointment scheduled Tuesday, August 16, 2022 at 1240. Patient aware of appointment and will be there. Kelli Vaughn verbalized understanding.  [name], Arvil Chaco, RN 9:55 AM

## 2022-08-22 ENCOUNTER — Ambulatory Visit: Payer: No Typology Code available for payment source | Attending: Hematology and Oncology

## 2022-08-22 VITALS — Wt 194.1 lb

## 2022-08-22 DIAGNOSIS — Z483 Aftercare following surgery for neoplasm: Secondary | ICD-10-CM | POA: Insufficient documentation

## 2022-08-22 NOTE — Therapy (Signed)
OUTPATIENT PHYSICAL THERAPY SOZO SCREENING NOTE   Patient Name: Kelli Vaughn MRN: [id number] DOB:1955-11-11, 67 y.o., female Today's Date: 08/22/2022  PCP: [name], MD REFERRING PROVIDER: Benay Pike, MD   PT End of Session - 08/22/22 1019     Visit Number 13   # unchanged due to screen only   PT Start Time 1016    PT Stop Time 1021    PT Time Calculation (min) 5 min    Activity Tolerance Patient tolerated treatment well    Behavior During Therapy WFL for tasks assessed/performed             Past Medical History:  Diagnosis Date   Breast cancer ([place])    left breast   Complication of anesthesia    Diabetes mellitus without complication ([place])    TYPE II   Glaucoma    Hypertension    Personal history of radiation therapy    PONV (postoperative nausea and vomiting)    Past Surgical History:  Procedure Laterality Date   BREAST LUMPECTOMY WITH RADIOACTIVE SEED AND SENTINEL LYMPH NODE BIOPSY Left 09/21/2021   Procedure: LEFT BREAST LUMPECTOMY WITH RADIOACTIVE SEED X2 AND SENTINEL LYMPH NODE BIOPSY;  Surgeon: [name], MD;  Location: [facility];  Service: General;  Laterality: Left;   CESAREAN SECTION     X2   EYE SURGERY Bilateral 2019   cataract surgery   RADIOACTIVE SEED GUIDED AXILLARY SENTINEL LYMPH NODE Left 09/21/2021   Procedure: RADIOACTIVE SEED GUIDED AXILLARY SENTINEL LYMPH NODE BIOPSY;  Surgeon: [name], MD;  Location: [facility];  Service: General;  Laterality: Left;   Patient Active Problem List   Diagnosis Date Noted   Genetic testing 09/17/2021   Family history of breast cancer 09/01/2021   Malignant neoplasm of upper-inner quadrant of left breast in female, estrogen receptor positive ([place]) 08/31/2021    REFERRING DIAG: left breast cancer at risk for lymphedema  THERAPY DIAG: Aftercare following surgery for neoplasm  PERTINENT HISTORY: Patient was diagnosed on 05/20/2021 with left grade II invasive ductal carcinoma breast cancer. It  measures 3.7 cm and is located in the upper inner quadrant. It is ER/PR positive and HER 2 negative with a Ki67 of 5%. 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 Surgery for left lumpectomy with SLNB was performed on 09/21/2021 with 1/5 LN's. Pt had a post op seroma   PRECAUTIONS: left UE Lymphedema risk, None  SUBJECTIVE: Pt returns for her 3 month L-Dex screen.   PAIN:  Are you having pain? No  SOZO SCREENING: Patient was assessed today using the SOZO machine to determine the lymphedema index score. This was compared to her baseline score. It was determined that she is within the recommended range when compared to her baseline and no further action is needed at this time. She will continue SOZO screenings. These are done every 3 months for 2 years post operatively followed by every 6 months for 2 years, and then annually.   L-DEX FLOWSHEETS - 08/22/22 1000       L-DEX LYMPHEDEMA SCREENING   Measurement Type Unilateral    L-DEX MEASUREMENT EXTREMITY Upper Extremity    POSITION  Standing    DOMINANT SIDE Right    At Risk Side Left    BASELINE SCORE (UNILATERAL) 1.5    L-DEX SCORE (UNILATERAL) 7.3    VALUE CHANGE (UNILAT) 5.8               Otelia Limes, PTA 08/22/2022,  10:21 AM

## 2022-09-09 ENCOUNTER — Inpatient Hospital Stay (HOSPITAL_BASED_OUTPATIENT_CLINIC_OR_DEPARTMENT_OTHER): Payer: No Typology Code available for payment source | Admitting: Hematology and Oncology

## 2022-09-09 ENCOUNTER — Other Ambulatory Visit: Payer: Self-pay

## 2022-09-09 ENCOUNTER — Inpatient Hospital Stay: Payer: No Typology Code available for payment source | Attending: Hematology and Oncology

## 2022-09-09 VITALS — BP 152/59 | HR 80 | Temp 97.9°F | Resp 16 | Ht 59.0 in | Wt 193.0 lb

## 2022-09-09 DIAGNOSIS — Z79811 Long term (current) use of aromatase inhibitors: Secondary | ICD-10-CM | POA: Insufficient documentation

## 2022-09-09 DIAGNOSIS — Z803 Family history of malignant neoplasm of breast: Secondary | ICD-10-CM | POA: Insufficient documentation

## 2022-09-09 DIAGNOSIS — R232 Flushing: Secondary | ICD-10-CM | POA: Insufficient documentation

## 2022-09-09 DIAGNOSIS — Z800 Family history of malignant neoplasm of digestive organs: Secondary | ICD-10-CM | POA: Insufficient documentation

## 2022-09-09 DIAGNOSIS — Z170 Estrogen receptor positive status [ER+]: Secondary | ICD-10-CM

## 2022-09-09 DIAGNOSIS — R110 Nausea: Secondary | ICD-10-CM | POA: Insufficient documentation

## 2022-09-09 DIAGNOSIS — Z923 Personal history of irradiation: Secondary | ICD-10-CM | POA: Insufficient documentation

## 2022-09-09 DIAGNOSIS — C50212 Malignant neoplasm of upper-inner quadrant of left female breast: Secondary | ICD-10-CM

## 2022-09-09 DIAGNOSIS — Z87891 Personal history of nicotine dependence: Secondary | ICD-10-CM | POA: Insufficient documentation

## 2022-09-09 LAB — CBC WITH DIFFERENTIAL (CANCER CENTER ONLY)
Abs Immature Granulocytes: 0.06 10*3/uL (ref 0.00–0.07)
Basophils Absolute: 0.1 10*3/uL (ref 0.0–0.1)
Basophils Relative: 1 %
Eosinophils Absolute: 0.4 10*3/uL (ref 0.0–0.5)
Eosinophils Relative: 4 %
HCT: 40.7 % (ref 36.0–46.0)
Hemoglobin: 13.5 g/dL (ref 12.0–15.0)
Immature Granulocytes: 1 %
Lymphocytes Relative: 29 %
Lymphs Abs: 2.7 10*3/uL (ref 0.7–4.0)
MCH: 29 pg (ref 26.0–34.0)
MCHC: 33.2 g/dL (ref 30.0–36.0)
MCV: 87.5 fL (ref 80.0–100.0)
Monocytes Absolute: 0.8 10*3/uL (ref 0.1–1.0)
Monocytes Relative: 8 %
Neutro Abs: 5.2 10*3/uL (ref 1.7–7.7)
Neutrophils Relative %: 57 %
Platelet Count: 304 10*3/uL (ref 150–400)
RBC: 4.65 MIL/uL (ref 3.87–5.11)
RDW: 13.2 % (ref 11.5–15.5)
WBC Count: 9.2 10*3/uL (ref 4.0–10.5)
nRBC: 0 % (ref 0.0–0.2)

## 2022-09-09 LAB — CMP (CANCER CENTER ONLY)
ALT: 36 U/L (ref 0–44)
AST: 23 U/L (ref 15–41)
Albumin: 3.7 g/dL (ref 3.5–5.0)
Alkaline Phosphatase: 99 U/L (ref 38–126)
Anion gap: 7 (ref 5–15)
BUN: 20 mg/dL (ref 8–23)
CO2: 27 mmol/L (ref 22–32)
Calcium: 10 mg/dL (ref 8.9–10.3)
Chloride: 103 mmol/L (ref 98–111)
Creatinine: 0.56 mg/dL (ref 0.44–1.00)
GFR, Estimated: >60 mL/min (ref >60)
Glucose, Bld: 156 mg/dL — ABNORMAL HIGH (ref 70–99)
Potassium: 4.1 mmol/L (ref 3.5–5.1)
Sodium: 137 mmol/L (ref 135–145)
Total Bilirubin: 0.4 mg/dL (ref 0.3–1.2)
Total Protein: 7.3 g/dL (ref 6.5–8.1)

## 2022-09-09 NOTE — Assessment & Plan Note (Addendum)
This is a very pleasant 67 year old female patient with past medical history significant for type 2 diabetes mellitus, hypertension who had an abnormal mammogram back in 2021 with 6 mm breast mass in the left breast at 930 o'clock as well as left abnormal axillary lymph nodes, did not undergo biopsy at that time as recommended who presented with abnormal mammogram again which led to further testing.  Left breast needle core biopsy at this time showed invasive ductal carcinoma, grade 2 along with DCIS, ER +80% strong intensity, PR +100% strong intensity, KI 5% and HER2 negative.  Lymph node biopsy was negative which was thought to be discordant.    She was presented in the breast [place] and recommendation was to proceed with surgery upfront followed by adjuvant recommendations based on final pathology.  She is here to review her pathology.  We only have a preliminary report so far.  This showed left invasive ductal carcinoma, 13 mm in greatest dimension, Nottingham grade 2, DCIS grade 2, all margins negative, 1 out of 5 axillary lymph nodes with micrometastasis, tumor measuring 1.3 mm. Progs were repeated and issued as an addendum. This is ER moderate- weak staining 80%, PR 100%, Her 2 neg.  Oncotype DX showed a score of 11, there appears to be no benefit from chemotherapy.  Distant recurrence risk at 9 years of 13% with antiestrogen alone.  She is now s/p adjuvant radiation and is here on anastrozole.  She will continue on anastrozole except mild hot flashes, arthralgias and some nausea. She is willing to continue anastrozole. Recent mammogram neg for malignancy. She still needs baseline bone density, this is not scheduled yet. I gave her the phone number to call and schedule.   RTC in 6 months.

## 2022-09-09 NOTE — Progress Notes (Signed)
[facility] NOTE  Patient Care Team: [name], MD as PCP - General (Family Medicine) [name], MD as Consulting Physician (General Surgery) Benay Pike, MD as Consulting Physician (Hematology and Oncology) Kyung Rudd, MD as Consulting Physician (Radiation Oncology) Delice Bison, Charlestine Massed, NP as Nurse Practitioner (Hematology and Oncology) Harmon Pier, RN as Registered Nurse  CHIEF COMPLAINTS/PURPOSE OF CONSULTATION:  Follow up  HISTORY OF PRESENTING ILLNESS:   Kelli Vaughn 67 y.o. female is here because of recent diagnosis of left IDC. I reviewed her records extensively and collaborated the history with the patient.  SUMMARY OF ONCOLOGIC HISTORY: Oncology History Overview Note  Patient was called back for evaluation of LEFT breast calcifications, focal asymmetry, and possible enlarged LEFT axillary lymph node from screening mammogram at [facility] on 05/20/2021. 08/12/2021, Suspicious mass in the 9:30 o'clock location of the LEFT breast warranting tissue diagnosis. Two LEFT axillary lymph nodes with abnormal morphology. 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 Lymph node biopsy of left axilla negative, lymph node biopsy results were thought to be discordant.   Patient does recollect being asked to schedule biopsy back in 2021 for abnormal mammogram but she was worried or had this misinformation that biopsy can let the breast cancer spread to other parts hence she decided not to proceed with biopsy at that time. She is here with her 2 sons, speaks in [place], [place] interpreter present at the time of my visit.  Son also help with the translation.   Malignant neoplasm of upper-inner quadrant of left breast in female, estrogen receptor positive ([place])  08/31/2021 Initial Diagnosis   Malignant neoplasm of upper-inner quadrant of  left breast in female, estrogen receptor positive ([place])   09/01/2021 Cancer Staging   Staging form: Breast, AJCC 8th Edition - Clinical stage from 09/01/2021: Stage Unknown (cT1b, cNX, cM0, G2, ER+, PR+, HER2-) - Signed by Benay Pike, MD on 09/01/2021 Stage prefix: Initial diagnosis Histologic grading system: 3 grade system    Genetic Testing   [facility] CancerNext Panel was Negative. 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 Report date is 09/20/2021.  The CancerNext gene panel offered by [employer] includes sequencing, rearrangement analysis, and RNA analysis for the following 36 genes:   APC, ATM, AXIN2, BARD1, BMPR1A, BRCA1, BRCA2, BRIP1, CDH1, CDK4, CDKN2A, CHEK2, DICER1, HOXB13, EPCAM, GREM1, MLH1, MSH2, MSH3, MSH6, MUTYH, NBN, NF1, NTHL1, PALB2, PMS2, POLD1, POLE, PTEN, RAD51C, RAD51D, RECQL, SMAD4, SMARCA4, STK11, and TP53.    09/21/2021 Surgery   Patient had left breast lumpectomy with a sentinel lymph node excision on 09/21/2021.  Final pathology showed invasive carcinoma of no special type, 13 mm in greatest dimension, grade 2, DCIS grade 2, all margins negative for invasive carcinoma and DCIS, distance from invasive carcinoma to closest margin is 0.5 mm, posterior margin and distance from DCIS to closest margin, 1 mm posterior margin.  5 axillary lymph nodes removed.  1 axillary lymph node with micrometastasis, metastatic deposit of 0.3 mm in greatest dimension.   Prognostics ER +80% moderate weak staining intensity, PR +100% strong staining intensity and HER2 negative    Oncotype testing   Oncotype DX score of 11.  Distant recurrence risk at 9 years of 13% with antiestrogen therapy.  No benefit from chemotherapy   11/11/2021 - 12/09/2021 Radiation Therapy    Treatment  Dates: 11/11/2021 through 12/09/2021 Site Technique Total Dose (Gy) Dose per Fx (Gy) Completed Fx Beam Energies  Breast, Left: Breast_L 3D 42.56/42.56 2.66 16/16 10X  Breast, Left:  Breast_L_Bst 3D 8/8 2 4/4 6X, 10X      12/2021 -  Anti-estrogen oral therapy   Anastrozole    Interval history  Patient is here for follow up on anastrozole. She says she has some hot flashes and nausea from anastrozole. She takes anastrozole around 2 PM. She had her mammogram  08/16/2022, no evidence of breast malignancy. She has some aches and pains in the chest and axilla. Rest of the pertinent 10 point ROS reviewed and negative  MEDICAL HISTORY:  Past Medical History:  Diagnosis Date   Breast cancer ([place])    left breast   Complication of anesthesia    Diabetes mellitus without complication ([place])    TYPE II   Glaucoma    Hypertension    Personal history of radiation therapy    PONV (postoperative nausea and vomiting)     SURGICAL HISTORY: Past Surgical History:  Procedure Laterality Date   BREAST LUMPECTOMY WITH RADIOACTIVE SEED AND SENTINEL LYMPH NODE BIOPSY Left 09/21/2021   Procedure: LEFT BREAST LUMPECTOMY WITH RADIOACTIVE SEED X2 AND SENTINEL LYMPH NODE BIOPSY;  Surgeon: [name], MD;  Location: [facility];  Service: General;  Laterality: Left;   CESAREAN SECTION     X2   EYE SURGERY Bilateral 2019   cataract surgery   RADIOACTIVE SEED GUIDED AXILLARY SENTINEL LYMPH NODE Left 09/21/2021   Procedure: RADIOACTIVE SEED GUIDED AXILLARY SENTINEL LYMPH NODE BIOPSY;  Surgeon: [name], MD;  Location: [facility];  Service: General;  Laterality: Left;    SOCIAL HISTORY: Social History   Socioeconomic History   Marital status: Divorced    Spouse name: Not on file   Number of children: 2   Years of education: Not on file   Highest education level: Some college, no degree  Occupational History   Not on file  Tobacco Use   Smoking status: Former    Types: Cigarettes   Smokeless tobacco: Never  Vaping Use   Vaping Use: Never used  Substance and Sexual Activity   Alcohol use: No   Drug use: No   Sexual activity: Not Currently  Other Topics Concern   Not on file   Social History Narrative   Not on file   Social Determinants of Health   Financial Resource Strain: Not on file  Food Insecurity: No Food Insecurity (08/16/2022)   Hunger Vital Sign    Worried About Running Out of Food in the Last Year: Never true    [place] in the Last Year: Never true  Transportation Needs: No Transportation Needs (08/16/2022)   PRAPARE - Hydrologist (Medical): No    Lack of Transportation (Non-Medical): No  Physical Activity: Not on file  Stress: Not on file  Social Connections: Not on file  Intimate Partner Violence: Not on file    FAMILY HISTORY: Family History  Problem Relation Age of Onset   Heart disease Mother    Bone cancer Father    Breast cancer Sister        dx. 66s   Stomach cancer Sister        dx. 41s, unsure if second primary or metastasis   Breast cancer Niece     ALLERGIES:  has No Known Allergies.  MEDICATIONS:  Current Outpatient Medications  Medication Sig Dispense  Refill   anastrozole (ARIMIDEX) 1 MG tablet Take 1 tablet (1 mg total) by mouth daily. 90 tablet 3   aspirin EC 81 MG tablet Take 81 mg by mouth at bedtime.     cholecalciferol (VITAMIN D3) 25 MCG (1000 UNIT) tablet Take 1,000 Units by mouth daily.     gabapentin (NEURONTIN) 300 MG capsule Take 300 mg by mouth daily at 12 noon.     glipiZIDE (GLUCOTROL) 10 MG tablet Take 10 mg by mouth daily before breakfast.     hydrochlorothiazide (HYDRODIURIL) 25 MG tablet Take 25 mg by mouth in the morning.     ibuprofen (ADVIL,MOTRIN) 800 MG tablet Take 1,600 mg by mouth every 8 (eight) hours as needed for moderate pain.     insulin glargine (LANTUS) 100 UNIT/ML injection Inject 35 Units into the skin 2 (two) times daily.     lisinopril (ZESTRIL) 10 MG tablet Take 10 mg by mouth in the morning.     LUMIGAN 0.01 % SOLN Place 1 drop into both eyes at bedtime.   3   SitaGLIPtin-MetFORMIN HCl (JANUMET XR) 50-1000 MG TB24 Take 1 tablet by mouth in the  morning and at bedtime.     Vitamin D, Ergocalciferol, (DRISDOL) 1.25 MG (50000 UNIT) CAPS capsule Take 1 capsule (50,000 Units total) by mouth every 7 (seven) days. (Patient not taking: Reported on 08/16/2022) 12 capsule 0   No current facility-administered medications for this visit.     PHYSICAL EXAMINATION: ECOG PERFORMANCE STATUS: 0 - Asymptomatic  Vitals:   09/09/22 0859  BP: (!) 152/59  Pulse: 80  Resp: 16  Temp: 97.9 F (36.6 C)  SpO2: 98%     Filed Weights   09/09/22 0859  Weight: 193 lb (87.5 kg)    General appearance: Alert oriented and in no acute distress Both breasts inspected and palpated. Post surgical and rad changes noted in the left breast with no palpable masses or concern for recurrence. No regional adenopathy. Right breast normal to inspection and palpation as well. The axilla on both sides is clear with no concerns No palpable lumps  LABORATORY DATA:  I have reviewed the data as listed Lab Results  Component Value Date   WBC 9.2 09/09/2022   HGB 13.5 09/09/2022   HCT 40.7 09/09/2022   MCV 87.5 09/09/2022   PLT 304 09/09/2022   Lab Results  Component Value Date   NA 137 03/11/2022   K 4.1 03/11/2022   CL 103 03/11/2022   CO2 30 03/11/2022    RADIOGRAPHIC STUDIES: I have personally reviewed the radiological reports and agreed with the findings in the report.  ASSESSMENT AND PLAN:  Malignant neoplasm of upper-inner quadrant of left breast in female, estrogen receptor positive ([place]) This is a very pleasant 67 year old female patient with past medical history significant for type 2 diabetes mellitus, hypertension who had an abnormal mammogram back in 2021 with 6 mm breast mass in the left breast at 930 o'clock as well as left abnormal axillary lymph nodes, did not undergo biopsy at that time as recommended who presented with abnormal mammogram again which led to further testing.  Left breast needle core biopsy at this time showed invasive ductal  carcinoma, grade 2 along with DCIS, ER +80% strong intensity, PR +100% strong intensity, KI 5% and HER2 negative.  Lymph node biopsy was negative which was thought to be discordant.    She was presented in the breast [facility] and recommendation was to proceed with surgery upfront followed  by adjuvant recommendations based on final pathology.  She is here to review her pathology.  We only have a preliminary report so far.  This showed left invasive ductal carcinoma, 13 mm in greatest dimension, Nottingham grade 2, DCIS grade 2, all margins negative, 1 out of 5 axillary lymph nodes with micrometastasis, tumor measuring 1.3 mm. Progs were repeated and issued as an addendum. This is ER moderate- weak staining 80%, PR 100%, Her 2 neg.  Oncotype DX showed a score of 11, there appears to be no benefit from chemotherapy.  Distant recurrence risk at 9 years of 13% with antiestrogen alone.  She is now s/p adjuvant radiation and is here on anastrozole.  She will continue on anastrozole except mild hot flashes, arthralgias and some nausea. She is willing to continue anastrozole. Recent mammogram neg for malignancy. She still needs baseline bone density, this is not scheduled yet. I gave her the phone number to call and schedule.   RTC in 6 months.  I spent 30 minutes in the care of this patient including review role of antiestrogen therapy,, mechanism of action, adverse effects of antiestrogen therapy counseling and coordination of care. All questions were answered. The patient knows to call the clinic with any problems, questions or concerns.    Benay Pike, MD 09/09/22

## 2022-11-25 ENCOUNTER — Encounter (HOSPITAL_BASED_OUTPATIENT_CLINIC_OR_DEPARTMENT_OTHER): Payer: Self-pay | Admitting: Emergency Medicine

## 2022-11-25 ENCOUNTER — Emergency Department (HOSPITAL_BASED_OUTPATIENT_CLINIC_OR_DEPARTMENT_OTHER): Payer: No Typology Code available for payment source | Admitting: Radiology

## 2022-11-25 ENCOUNTER — Emergency Department (HOSPITAL_BASED_OUTPATIENT_CLINIC_OR_DEPARTMENT_OTHER)
Admission: EM | Admit: 2022-11-25 | Discharge: 2022-11-25 | Disposition: A | Discharge status: Home / Self Care | Payer: No Typology Code available for payment source | Attending: Emergency Medicine | Admitting: Emergency Medicine

## 2022-11-25 DIAGNOSIS — I10 Essential (primary) hypertension: Secondary | ICD-10-CM | POA: Insufficient documentation

## 2022-11-25 DIAGNOSIS — Z853 Personal history of malignant neoplasm of breast: Secondary | ICD-10-CM | POA: Insufficient documentation

## 2022-11-25 DIAGNOSIS — Z7982 Long term (current) use of aspirin: Secondary | ICD-10-CM | POA: Insufficient documentation

## 2022-11-25 DIAGNOSIS — Y9301 Activity, walking, marching and hiking: Secondary | ICD-10-CM | POA: Insufficient documentation

## 2022-11-25 DIAGNOSIS — E119 Type 2 diabetes mellitus without complications: Secondary | ICD-10-CM | POA: Insufficient documentation

## 2022-11-25 DIAGNOSIS — Z7984 Long term (current) use of oral hypoglycemic drugs: Secondary | ICD-10-CM | POA: Insufficient documentation

## 2022-11-25 DIAGNOSIS — Z79899 Other long term (current) drug therapy: Secondary | ICD-10-CM | POA: Insufficient documentation

## 2022-11-25 DIAGNOSIS — W010XXA Fall on same level from slipping, tripping and stumbling without subsequent striking against object, initial encounter: Secondary | ICD-10-CM | POA: Insufficient documentation

## 2022-11-25 DIAGNOSIS — Z794 Long term (current) use of insulin: Secondary | ICD-10-CM | POA: Insufficient documentation

## 2022-11-25 DIAGNOSIS — S2231XA Fracture of one rib, right side, initial encounter for closed fracture: Secondary | ICD-10-CM

## 2022-11-25 MED ORDER — HYDROCODONE-ACETAMINOPHEN 5-325 MG PO TABS
1.0000 | ORAL_TABLET | Freq: Once | ORAL | Status: AC
  Administered 2022-11-25: 1 via ORAL
  Filled 2022-11-25: qty 1

## 2022-11-25 MED ORDER — HYDROCODONE-ACETAMINOPHEN 5-325 MG PO TABS: 1.0000 | ORAL_TABLET | Freq: Four times a day (QID) | ORAL | 0 refills | Status: DC | PRN

## 2022-11-25 NOTE — ED Provider Notes (Signed)
[facility] EMERGENCY DEPARTMENT AT [facility] Provider Note   CSN: [id number] Arrival date & time: 11/25/22  1333     History  Chief Complaint  Patient presents with   Fall   Shoulder Pain    Kelli Vaughn is a 67 y.o. female with past medical history significant for diabetes, hypertension, breast cancer sent to the ED by her doctor for x-rays due to a fall that occurred yesterday.  Patient was walking when she either stumbled or tripped causing her to fall onto her right side.  Patient currently complaining of right shoulder pain that radiates down her arm and right-sided chest wall pain.  Denies loss of consciousness, shortness of breath, head injury, neck pain, back pain, joint swelling.  Patient also received an injection at her primary care doctor to help with swelling, but cannot recall what medicine this was specifically.  She has taken Tylenol and ibuprofen for pain with some relief.        Home Medications Prior to Admission medications   Medication Sig Start Date End Date Taking? Authorizing Provider  anastrozole (ARIMIDEX) 1 MG tablet Take 1 tablet (1 mg total) by mouth daily. 04/06/22   Rachel Moulds, MD  aspirin EC 81 MG tablet Take 81 mg by mouth at bedtime.    [provider]  cholecalciferol (VITAMIN D3) 25 MCG (1000 UNIT) tablet Take 1,000 Units by mouth daily.    [provider]  gabapentin (NEURONTIN) 300 MG capsule Take 300 mg by mouth daily at 12 noon.    [provider]  glipiZIDE (GLUCOTROL) 10 MG tablet Take 10 mg by mouth daily before breakfast.    [provider]  hydrochlorothiazide (HYDRODIURIL) 25 MG tablet Take 25 mg by mouth in the morning.    [provider]  ibuprofen (ADVIL,MOTRIN) 800 MG tablet Take 1,600 mg by mouth every 8 (eight) hours as needed for moderate pain.    [provider]  insulin glargine (LANTUS) 100 UNIT/ML injection Inject 35 Units into the skin 2 (two) times daily.     [provider]  lisinopril (ZESTRIL) 10 MG tablet Take 10 mg by mouth in the morning.    [provider]  LUMIGAN 0.01 % SOLN Place 1 drop into both eyes at bedtime.  04/09/18   [provider]  SitaGLIPtin-MetFORMIN HCl (JANUMET XR) 50-1000 MG TB24 Take 1 tablet by mouth in the morning and at bedtime.    [provider]  Vitamin D, Ergocalciferol, (DRISDOL) 1.25 MG (50000 UNIT) CAPS capsule Take 1 capsule (50,000 Units total) by mouth every 7 (seven) days. Patient not taking: Reported on 08/16/2022 03/17/22   Loa Socks, NP      Allergies    Patient has no known allergies.    Review of Systems   Review of Systems  Respiratory:  Negative for chest tightness and shortness of breath.   Cardiovascular:  Positive for chest pain (right side chest wall).  Musculoskeletal:  Positive for arthralgias (right shoulder). Negative for back pain, joint swelling and neck pain.  Neurological:  Negative for syncope.    Physical Exam Updated Vital Signs BP (!) 145/59 (BP Location: Right Arm)   Pulse 88   Temp 98.6 F (37 C) (Oral)   Resp 17   SpO2 97%  Physical Exam Vitals and nursing note reviewed. Exam conducted with a chaperone present.  Constitutional:      General: She is not in acute distress.    Appearance: Normal  appearance. She is not ill-appearing or diaphoretic.  Cardiovascular:     Rate and Rhythm: Normal rate and regular rhythm.  Pulmonary:     Effort: Pulmonary effort is normal.     Breath sounds: Normal breath sounds and air entry.  Chest:     Chest wall: Tenderness present. No deformity.    Musculoskeletal:     Right shoulder: Tenderness present. No swelling, deformity or bony tenderness. Decreased range of motion (due to pain). Normal pulse.     Comments: No gross deformities or obvious injuries to the right upper extremity.  Radial pulses 2+.  Sensation is intact.  No significant swelling.  Generalized tenderness to palpation  and decreased ROM to the right shoulder.  Skin:    General: Skin is warm and dry.     Capillary Refill: Capillary refill takes less than 2 seconds.  Neurological:     Mental Status: She is alert. Mental status is at baseline.  Psychiatric:        Mood and Affect: Mood normal.        Behavior: Behavior normal.     ED Results / Procedures / Treatments   Labs (all labs ordered are listed, but only abnormal results are displayed) Labs Reviewed - No data to display  EKG None  Radiology DG Ribs Unilateral W/Chest Right  Result Date: 11/25/2022 CLINICAL DATA:  Right rib pain status post fall EXAM: RIGHT RIBS AND CHEST - 3+ VIEW COMPARISON:  Chest radiograph 05/22/2018 FINDINGS: Nondisplaced fracture of the anterolateral segment of the right tenth rib. Multiple left chest and axilla surgical clips are seen. No pneumothorax. Cardiomediastinal silhouette and pulmonary vasculature within normal limits. IMPRESSION: Nondisplaced fracture of the anterolateral segment of the right tenth rib. Electronically Signed   By: Acquanetta Belling M.D.   On: 11/25/2022 14:31   DG Shoulder Right  Result Date: 11/25/2022 CLINICAL DATA:  Right shoulder and rib pain status post fall EXAM: RIGHT SHOULDER - 2+ VIEW COMPARISON:  None available FINDINGS: No fracture or dislocation. Calcifications adjacent to greater tuberosity consistent with calcific rotator cuff tendinosis. Mild degenerative changes of the acromioclavicular and glenohumeral joints. IMPRESSION: No acute fracture or dislocation of the right shoulder. Electronically Signed   By: Acquanetta Belling M.D.   On: 11/25/2022 14:24    Procedures Procedures    Medications Ordered in ED Medications  HYDROcodone-acetaminophen (NORCO/VICODIN) 5-325 MG per tablet 1 tablet (has no administration in time range)    ED Course/ Medical Decision Making/ A&P                             Medical Decision Making Amount and/or Complexity of Data Reviewed Radiology:  ordered.  Risk Prescription drug management.   This patient presents to the ED with chief complaint(s) of right shoulder pain, right side chest wall pain following a fall with pertinent past medical history of diabetes, hypertension.  The complaint involves an extensive differential diagnosis and also carries with it a high risk of complications and morbidity.    The differential diagnosis includes acute fracture or dislocation to right shoulder, acute fracture to right chest wall, contusion, musculoskeletal strain, ligamentous injury  The initial plan is to obtain x-rays  Initial Assessment:   Exam significant for generalized tenderness to the right shoulder with normal ROM.  Right upper extremity is neurovascularly intact.  Radial pulses 2+.  No overlying skin changes over the shoulder, or right upper extremity.  Right chest  wall tender anterolaterally.    Independent visualization and interpretation of imaging: I independently visualized the following imaging with scope of interpretation limited to determining acute life threatening conditions related to emergency care: Right shoulder x-ray, right chest/ribs, which revealed anterolateral 10th rib fracture, nondisplaced; no acute fracture or dislocation to the right shoulder.  I agree with radiologist interpretation.  Treatment and Reassessment: Patient will be given dose of pain medication while in the ED.  Disposition:   Patient states that she has an incentive spirometer at home.  Patient provided with information and instructions on how to use incentive spirometer.  Advised patient to follow-up with primary care provider if her pain does not improve.  Prescription for pain medicine sent to pharmacy.  Discussed supportive care measures with patient and her son at bedside.  The patient has been appropriately medically screened and/or stabilized in the ED. I have low suspicion for any other emergent medical condition which would require  further screening, evaluation or treatment in the ED or require inpatient management. At time of discharge the patient is hemodynamically stable and in no acute distress. I have discussed work-up results and diagnosis with patient and answered all questions. Patient is agreeable with discharge plan. We discussed strict return precautions for returning to the emergency department and they verbalized understanding.            Final Clinical Impression(s) / ED Diagnoses Final diagnoses:  None    Rx / DC Orders ED Discharge Orders     None         Lenard Simmer, PA-C 11/25/22 1607    Rondel Baton, MD 11/28/22 1526

## 2022-11-25 NOTE — ED Triage Notes (Signed)
Pt here sent over by her md for xrays after a fall yesterday , pt is c/o right shoulder and right rib pain

## 2022-11-25 NOTE — Discharge Instructions (Addendum)
Thank you for allowing me to be part of your care today.  Your x-ray today showed you have a rib fracture.  Treatment of this is conservative as these generally heal well on their own.    I have sent pain medication to the pharmacy to use whenever you have severe pain.  If you are experiencing mild to moderate pain, I recommend taking ibuprofen or acetaminophen.  Please use your incentive spirometer to keep your lungs open.   I recommend follow-up with primary care provider if your pain does not improve or if it worsens.    Return to the ED if you experience sudden worsening of your symptoms or if you have any new concerns.

## 2022-11-28 ENCOUNTER — Ambulatory Visit: Payer: Self-pay

## 2022-12-26 ENCOUNTER — Ambulatory Visit: Payer: Self-pay | Attending: Hematology and Oncology

## 2022-12-26 VITALS — Wt 183.0 lb

## 2022-12-26 DIAGNOSIS — Z483 Aftercare following surgery for neoplasm: Secondary | ICD-10-CM | POA: Insufficient documentation

## 2022-12-26 NOTE — Therapy (Signed)
OUTPATIENT PHYSICAL THERAPY SOZO SCREENING NOTE   Patient Name: Kelli Vaughn MRN: [id number] DOB:1955-12-01, 67 y.o., female Today's Date: 12/26/2022  PCP: Selinda Flavin, MD REFERRING PROVIDER: Rachel Moulds, MD   PT End of Session - 12/26/22 0849     Visit Number 13   # unchanged due to screen only   PT Start Time 0847    PT Stop Time 0902    PT Time Calculation (min) 15 min    Activity Tolerance Patient tolerated treatment well    Behavior During Therapy WFL for tasks assessed/performed             Past Medical History:  Diagnosis Date   Breast cancer (HCC)    left breast   Complication of anesthesia    Diabetes mellitus without complication (HCC)    TYPE II   Glaucoma    Hypertension    Personal history of radiation therapy    PONV (postoperative nausea and vomiting)    Past Surgical History:  Procedure Laterality Date   BREAST LUMPECTOMY WITH RADIOACTIVE SEED AND SENTINEL LYMPH NODE BIOPSY Left 09/21/2021   Procedure: LEFT BREAST LUMPECTOMY WITH RADIOACTIVE SEED X2 AND SENTINEL LYMPH NODE BIOPSY;  Surgeon: Almond Lint, MD;  Location: [facility] OR;  Service: General;  Laterality: Left;   CESAREAN SECTION     X2   EYE SURGERY Bilateral 2019   cataract surgery   RADIOACTIVE SEED GUIDED AXILLARY SENTINEL LYMPH NODE Left 09/21/2021   Procedure: RADIOACTIVE SEED GUIDED AXILLARY SENTINEL LYMPH NODE BIOPSY;  Surgeon: Almond Lint, MD;  Location: [facility] OR;  Service: General;  Laterality: Left;   Patient Active Problem List   Diagnosis Date Noted   Genetic testing 09/17/2021   Family history of breast cancer 09/01/2021   Malignant neoplasm of upper-inner quadrant of left breast in female, estrogen receptor positive (HCC) 08/31/2021    REFERRING DIAG: left breast cancer at risk for lymphedema  THERAPY DIAG:  Aftercare following surgery for neoplasm  PERTINENT HISTORY:  Patient was diagnosed on 05/20/2021 with left grade II invasive ductal carcinoma breast cancer. It  measures 3.7 cm and is located in the upper inner quadrant. It is ER/PR positive and HER 2 negative with a Ki67 of 5%. 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 Surgery for left lumpectomy with SLNB was performed on 09/21/2021 with 1/5 LN's. Pt had a post op seroma   PRECAUTIONS: left UE Lymphedema risk, None  SUBJECTIVE: Pt returns for her 3 month L-Dex screen.   PAIN:  Are you having pain? No  SOZO SCREENING:  Patient was assessed today using the SOZO machine to determine the lymphedema index score. This was compared to her baseline score. It was determined that she is NOT within the recommended range when compared to her baseline and so she was fitted for a compression garment (Medi Harmony size III) while in the clinic today. It is recommended she return in 1 month to be reassessed. If she continues to measure outside the recommended range, physical therapy treatment will be recommended at that time and a referral requested. Also issued handout with instructions for wear and care of compression sleeve. Interpreter present for translating to pt.    L-DEX FLOWSHEETS - 12/26/22 0800       L-DEX LYMPHEDEMA SCREENING   Measurement Type Unilateral    L-DEX MEASUREMENT EXTREMITY Upper Extremity    POSITION  Standing    DOMINANT SIDE Right    At Risk Side Left  BASELINE SCORE (UNILATERAL) 1.5    L-DEX SCORE (UNILATERAL) 9.2    VALUE CHANGE (UNILAT) 7.7               Hermenia Bers, PTA 12/26/2022, 11:29 AM   PLEASE KEEP YOUR COMPRESSION GARMENT ON DURING THE DAY TO GET THE BEST SWELLING REDUCTION. HERE ARE SOME ADDITIONAL TIPS: Do not sleep in your garment. If you have pain or notice swelling in your hand or at the top of your shoulder, call your therapist. This may be a sign that you need a different garment. 3.  Take good care of your garment so it lasts longer: Follow washing instructions on your garment label or box. Wash periodically  using a mild detergent in warm water.  Do not use fabric softener or bleach.  Place garment in a mesh lingerie bag and use the gentle cycle of the washing machine or hand wash. Tumble dry low or lay flat to dry. TAKE CARE OF YOUR SKIN Apply a low pH moisturizing lotion to your skin daily Avoid scratching your skin Treat skin irritations quickly  Know the 5 warning signs of infection: redness, pain, warmth to touch, fever and increased swelling.  Call your physician immediately if you notice any of these signs of a possible infection.    Berna Spare, PTA 12/26/22 9:00 AM  Cancer [facility] [phone]

## 2023-01-23 ENCOUNTER — Ambulatory Visit: Payer: No Typology Code available for payment source | Attending: Hematology and Oncology

## 2023-01-23 VITALS — Wt 181.5 lb

## 2023-01-23 DIAGNOSIS — Z483 Aftercare following surgery for neoplasm: Secondary | ICD-10-CM | POA: Insufficient documentation

## 2023-01-23 NOTE — Therapy (Signed)
OUTPATIENT PHYSICAL THERAPY SOZO SCREENING NOTE   Patient Name: Kelli Vaughn MRN: [id number] DOB:11-Oct-1955, 67 y.o., female Today's Date: 01/23/2023  PCP: Selinda Flavin, MD REFERRING PROVIDER: Almond Lint, MD   PT End of Session - 01/23/23 1008     Visit Number 13   # unchanged due to screen only   PT Start Time 1006    PT Stop Time 1016    PT Time Calculation (min) 10 min    Activity Tolerance Patient tolerated treatment well    Behavior During Therapy WFL for tasks assessed/performed             Past Medical History:  Diagnosis Date   Breast cancer (HCC)    left breast   Complication of anesthesia    Diabetes mellitus without complication (HCC)    TYPE II   Glaucoma    Hypertension    Personal history of radiation therapy    PONV (postoperative nausea and vomiting)    Past Surgical History:  Procedure Laterality Date   BREAST LUMPECTOMY WITH RADIOACTIVE SEED AND SENTINEL LYMPH NODE BIOPSY Left 09/21/2021   Procedure: LEFT BREAST LUMPECTOMY WITH RADIOACTIVE SEED X2 AND SENTINEL LYMPH NODE BIOPSY;  Surgeon: Almond Lint, MD;  Location: [facility];  Service: General;  Laterality: Left;   CESAREAN SECTION     X2   EYE SURGERY Bilateral 2019   cataract surgery   RADIOACTIVE SEED GUIDED AXILLARY SENTINEL LYMPH NODE Left 09/21/2021   Procedure: RADIOACTIVE SEED GUIDED AXILLARY SENTINEL LYMPH NODE BIOPSY;  Surgeon: Almond Lint, MD;  Location: [facility];  Service: General;  Laterality: Left;   Patient Active Problem List   Diagnosis Date Noted   Genetic testing 09/17/2021   Family history of breast cancer 09/01/2021   Malignant neoplasm of upper-inner quadrant of left breast in female, estrogen receptor positive (HCC) 08/31/2021    REFERRING DIAG: left breast cancer at risk for lymphedema  THERAPY DIAG:  Aftercare following surgery for neoplasm  PERTINENT HISTORY: Patient was diagnosed on 05/20/2021 with left grade II invasive ductal carcinoma breast cancer. It  measures 3.7 cm and is located in the upper inner quadrant. It is ER/PR positive and HER 2 negative with a Ki67 of 5%. 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 Surgery for left lumpectomy with SLNB was performed on 09/21/2021 with 1/5 LN's. Pt had a post op seroma   PRECAUTIONS: left UE Lymphedema risk, None  SUBJECTIVE: Pt returns for her 1 month check from elevated baseline. "I've worn my sleeve every day."   PAIN:  Are you having pain? No  SOZO SCREENING: Patient was assessed today using the SOZO machine to determine the lymphedema index score. This was compared to her baseline score. It was determined that she is within the recommended range when compared to her baseline and no further action is needed at this time. She will continue SOZO screenings. These are done every 3 months for 2 years post operatively followed by every 6 months for 2 years, and then annually.  *interpreter present for SOZO   L-DEX FLOWSHEETS - 01/23/23 1000       L-DEX LYMPHEDEMA SCREENING   Measurement Type Unilateral    L-DEX MEASUREMENT EXTREMITY Upper Extremity    POSITION  Standing    DOMINANT SIDE Right    At Risk Side Left    BASELINE SCORE (UNILATERAL) 1.5    L-DEX SCORE (UNILATERAL) 2.2    VALUE CHANGE (UNILAT) 0.7  Hermenia Bers, PTA 01/23/2023, 10:17 AM

## 2023-03-14 ENCOUNTER — Inpatient Hospital Stay: Payer: Self-pay | Attending: Hematology and Oncology | Admitting: Hematology and Oncology

## 2023-03-14 VITALS — BP 151/46 | HR 91 | Temp 97.5°F | Resp 18 | Ht 59.0 in | Wt 184.4 lb

## 2023-03-14 DIAGNOSIS — Z87891 Personal history of nicotine dependence: Secondary | ICD-10-CM | POA: Insufficient documentation

## 2023-03-14 DIAGNOSIS — M2550 Pain in unspecified joint: Secondary | ICD-10-CM | POA: Insufficient documentation

## 2023-03-14 DIAGNOSIS — Z923 Personal history of irradiation: Secondary | ICD-10-CM | POA: Insufficient documentation

## 2023-03-14 DIAGNOSIS — E119 Type 2 diabetes mellitus without complications: Secondary | ICD-10-CM | POA: Insufficient documentation

## 2023-03-14 DIAGNOSIS — Z79811 Long term (current) use of aromatase inhibitors: Secondary | ICD-10-CM | POA: Insufficient documentation

## 2023-03-14 DIAGNOSIS — Z170 Estrogen receptor positive status [ER+]: Secondary | ICD-10-CM

## 2023-03-14 DIAGNOSIS — Z803 Family history of malignant neoplasm of breast: Secondary | ICD-10-CM | POA: Insufficient documentation

## 2023-03-14 DIAGNOSIS — Z800 Family history of malignant neoplasm of digestive organs: Secondary | ICD-10-CM | POA: Insufficient documentation

## 2023-03-14 DIAGNOSIS — C50212 Malignant neoplasm of upper-inner quadrant of left female breast: Secondary | ICD-10-CM

## 2023-03-14 DIAGNOSIS — R232 Flushing: Secondary | ICD-10-CM | POA: Insufficient documentation

## 2023-03-14 NOTE — Assessment & Plan Note (Addendum)
This is a very pleasant 67 year old female patient with past medical history significant for type 2 diabetes mellitus, hypertension who had an abnormal mammogram back in 2021 with 6 mm breast mass in the left breast at 930 o'clock as well as left abnormal axillary lymph nodes, did not undergo biopsy at that time as recommended who presented with abnormal mammogram again which led to further testing.  Left breast needle core biopsy at this time showed invasive ductal carcinoma, grade 2 along with DCIS, ER +80% strong intensity, PR +100% strong intensity, KI 5% and HER2 negative.  Lymph node biopsy was negative which was thought to be discordant.    She was presented in the breast MDC and recommendation was to proceed with surgery upfront followed by adjuvant recommendations based on final pathology.  Final path showed left invasive ductal carcinoma, 13 mm in greatest dimension, Nottingham grade 2, DCIS grade 2, all margins negative, 1 out of 5 axillary lymph nodes with micrometastasis, tumor measuring 1.3 mm. Progs were repeated and issued as an addendum. This is ER moderate- weak staining 80%, PR 100%, Her 2 neg.  Oncotype DX showed a score of 11, there appears to be no benefit from chemotherapy.   Distant recurrence risk at 9 years of 13% with antiestrogen alone.   She is now s/p adjuvant radiation and is here on anastrozole.  Side effects with anastrozole include mild hot flashes, arthralgias. She is willing to continue anastrozole. Recent mammogram neg for malignancy. She however complains of worsening pain and tenderness of left breast, onset a month ago, no major changes on physical exam. We ordered repeat mammo unilateral and [place] and will call her with the results. Reassured her. Encouraged to do carb control for better control of DM  RTC in 6 months.

## 2023-03-14 NOTE — Progress Notes (Signed)
[facility] CONSULT NOTE  Patient Care Team: Selinda Flavin, MD as PCP - General (Family Medicine) Almond Lint, MD as Consulting Physician (General Surgery) Rachel Moulds, MD as Consulting Physician (Hematology and Oncology) Dorothy Puffer, MD as Consulting Physician (Radiation Oncology) Axel Filler, Larna Daughters, NP as Nurse Practitioner (Hematology and Oncology) Maryclare Labrador, RN as Registered Nurse  CHIEF COMPLAINTS/PURPOSE OF CONSULTATION:  Follow up  HISTORY OF PRESENTING ILLNESS:   Kelli Vaughn 67 y.o. female is here because of recent diagnosis of left IDC. I reviewed her records extensively and collaborated the history with the patient.  SUMMARY OF ONCOLOGIC HISTORY: Oncology History Overview Note  Patient was called back for evaluation of LEFT breast calcifications, focal asymmetry, and possible enlarged LEFT axillary lymph node from screening mammogram at [facility] on 05/20/2021. 08/12/2021, Suspicious mass in the 9:30 o'clock location of the LEFT breast warranting tissue diagnosis. Two LEFT axillary lymph nodes with abnormal morphology. 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 Lymph node biopsy of left axilla negative, lymph node biopsy results were thought to be discordant.   Patient does recollect being asked to schedule biopsy back in 2021 for abnormal mammogram but she was worried or had this misinformation that biopsy can let the breast cancer spread to other parts hence she decided not to proceed with biopsy at that time. She is here with her 2 sons, speaks in [place], Spanish interpreter present at the time of my visit.  Son also help with the translation.   Malignant neoplasm of upper-inner quadrant of left breast in female, estrogen receptor positive (HCC)  08/31/2021 Initial Diagnosis   Malignant neoplasm of upper-inner quadrant of  left breast in female, estrogen receptor positive (HCC)   09/01/2021 Cancer Staging   Staging form: Breast, AJCC 8th Edition - Clinical stage from 09/01/2021: Stage Unknown (cT1b, cNX, cM0, G2, ER+, PR+, HER2-) - Signed by Rachel Moulds, MD on 09/01/2021 Stage prefix: Initial diagnosis Histologic grading system: 3 grade system    Genetic Testing   [facility] CancerNext Panel was Negative. 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 Report date is 09/20/2021.  The CancerNext gene panel offered by [employer] includes sequencing, rearrangement analysis, and RNA analysis for the following 36 genes:   APC, ATM, AXIN2, BARD1, BMPR1A, BRCA1, BRCA2, BRIP1, CDH1, CDK4, CDKN2A, CHEK2, DICER1, HOXB13, EPCAM, GREM1, MLH1, MSH2, MSH3, MSH6, MUTYH, NBN, NF1, NTHL1, PALB2, PMS2, POLD1, POLE, PTEN, RAD51C, RAD51D, RECQL, SMAD4, SMARCA4, STK11, and TP53.    09/21/2021 Surgery   Patient had left breast lumpectomy with a sentinel lymph node excision on 09/21/2021.  Final pathology showed invasive carcinoma of no special type, 13 mm in greatest dimension, grade 2, DCIS grade 2, all margins negative for invasive carcinoma and DCIS, distance from invasive carcinoma to closest margin is 0.5 mm, posterior margin and distance from DCIS to closest margin, 1 mm posterior margin.  5 axillary lymph nodes removed.  1 axillary lymph node with micrometastasis, metastatic deposit of 0.3 mm in greatest dimension.   Prognostics ER +80% moderate weak staining intensity, PR +100% strong staining intensity and HER2 negative    Oncotype testing   Oncotype DX score of 11.  Distant recurrence risk at 9 years of 13% with antiestrogen therapy.  No benefit from chemotherapy   11/11/2021 - 12/09/2021 Radiation Therapy    Treatment  Dates: 11/11/2021 through 12/09/2021 Site Technique Total Dose (Gy) Dose per Fx (Gy) Completed Fx Beam Energies  Breast, Left: Breast_L 3D 42.56/42.56 2.66 16/16 10X  Breast, Left:  Breast_L_Bst 3D 8/8 2 4/4 6X, 10X      12/2021 -  Anti-estrogen oral therapy   Anastrozole    Interval history  Patient is here for follow up on anastrozole. She had her mammogram  08/16/2022, no evidence of breast malignancy. For the past one month, she has been noticing more breast pain, sensitivity, she can't even touch the area. No lumps noted. She also has noticed that the anastrozole is in a different bottle, her sugars are out of control. She was wondering since the anastrozole pills look different, if this worsening sugars and pain in the left breast are due to this.  Rest of the pertinent 10 point ROS reviewed and negative  MEDICAL HISTORY:  Past Medical History:  Diagnosis Date   Breast cancer (HCC)    left breast   Complication of anesthesia    Diabetes mellitus without complication (HCC)    TYPE II   Glaucoma    Hypertension    Personal history of radiation therapy    PONV (postoperative nausea and vomiting)     SURGICAL HISTORY: Past Surgical History:  Procedure Laterality Date   BREAST LUMPECTOMY WITH RADIOACTIVE SEED AND SENTINEL LYMPH NODE BIOPSY Left 09/21/2021   Procedure: LEFT BREAST LUMPECTOMY WITH RADIOACTIVE SEED X2 AND SENTINEL LYMPH NODE BIOPSY;  Surgeon: Almond Lint, MD;  Location: [facility] OR;  Service: General;  Laterality: Left;   CESAREAN SECTION     X2   EYE SURGERY Bilateral 2019   cataract surgery   RADIOACTIVE SEED GUIDED AXILLARY SENTINEL LYMPH NODE Left 09/21/2021   Procedure: RADIOACTIVE SEED GUIDED AXILLARY SENTINEL LYMPH NODE BIOPSY;  Surgeon: Almond Lint, MD;  Location: [facility] OR;  Service: General;  Laterality: Left;    SOCIAL HISTORY: Social History   Socioeconomic History   Marital status: Divorced    Spouse name: Not on file   Number of children: 2   Years of education: Not on file   Highest education level: Some college, no degree  Occupational History   Not on file  Tobacco Use   Smoking status: Former    Types: Cigarettes    Smokeless tobacco: Never  Vaping Use   Vaping status: Never Used  Substance and Sexual Activity   Alcohol use: No   Drug use: No   Sexual activity: Not Currently  Other Topics Concern   Not on file  Social History Narrative   Not on file   Social Determinants of Health   Financial Resource Strain: Not on file  Food Insecurity: No Food Insecurity (08/16/2022)   Hunger Vital Sign    Worried About Running Out of Food in the Last Year: Never true    Ran Out of Food in the Last Year: Never true  Transportation Needs: No Transportation Needs (08/16/2022)   PRAPARE - Administrator, Civil Service (Medical): No    Lack of Transportation (Non-Medical): No  Physical Activity: Not on file  Stress: Not on file  Social Connections: Not on file  Intimate Partner Violence: Not on file    FAMILY HISTORY: Family History  Problem Relation Age of Onset   Heart disease Mother    Bone cancer Father    Breast cancer Sister        dx. 30s   Stomach cancer Sister  dx. 30s, unsure if second primary or metastasis   Breast cancer Niece     ALLERGIES:  has No Known Allergies.  MEDICATIONS:  Current Outpatient Medications  Medication Sig Dispense Refill   anastrozole (ARIMIDEX) 1 MG tablet Take 1 tablet (1 mg total) by mouth daily. 90 tablet 3   aspirin EC 81 MG tablet Take 81 mg by mouth at bedtime.     cholecalciferol (VITAMIN D3) 25 MCG (1000 UNIT) tablet Take 1,000 Units by mouth daily.     gabapentin (NEURONTIN) 300 MG capsule Take 300 mg by mouth daily at 12 noon.     glipiZIDE (GLUCOTROL) 10 MG tablet Take 10 mg by mouth daily before breakfast.     hydrochlorothiazide (HYDRODIURIL) 25 MG tablet Take 25 mg by mouth in the morning.     HYDROcodone-acetaminophen (NORCO/VICODIN) 5-325 MG tablet Take 1 tablet by mouth every 6 (six) hours as needed. 14 tablet 0   ibuprofen (ADVIL,MOTRIN) 800 MG tablet Take 1,600 mg by mouth every 8 (eight) hours as needed for moderate pain.      insulin glargine (LANTUS) 100 UNIT/ML injection Inject 35 Units into the skin 2 (two) times daily.     lisinopril (ZESTRIL) 10 MG tablet Take 10 mg by mouth in the morning.     LUMIGAN 0.01 % SOLN Place 1 drop into both eyes at bedtime.   3   SitaGLIPtin-MetFORMIN HCl (JANUMET XR) 50-1000 MG TB24 Take 1 tablet by mouth in the morning and at bedtime.     Vitamin D, Ergocalciferol, (DRISDOL) 1.25 MG (50000 UNIT) CAPS capsule Take 1 capsule (50,000 Units total) by mouth every 7 (seven) days. (Patient not taking: Reported on 08/16/2022) 12 capsule 0   No current facility-administered medications for this visit.     PHYSICAL EXAMINATION: ECOG PERFORMANCE STATUS: 0 - Asymptomatic  Vitals:   03/14/23 1018  BP: (!) 151/46  Pulse: 91  Resp: 18  Temp: (!) 97.5 F (36.4 C)  SpO2: 99%      Filed Weights   03/14/23 1018  Weight: 184 lb 6.4 oz (83.6 kg)     General appearance: Alert oriented and in no acute distress Both breasts inspected and palpated. Post surgical and rad changes noted in the left breast with no palpable masses or concern for recurrence. No regional adenopathy. Right breast normal to inspection and palpation as well. The axilla on both sides is clear with no concerns No palpable lumps  No changes concerning for recurrence.  LABORATORY DATA:  I have reviewed the data as listed Lab Results  Component Value Date   WBC 9.2 09/09/2022   HGB 13.5 09/09/2022   HCT 40.7 09/09/2022   MCV 87.5 09/09/2022   PLT 304 09/09/2022   Lab Results  Component Value Date   NA 137 09/09/2022   K 4.1 09/09/2022   CL 103 09/09/2022   CO2 27 09/09/2022    RADIOGRAPHIC STUDIES: I have personally reviewed the radiological reports and agreed with the findings in the report.  ASSESSMENT AND PLAN:  Malignant neoplasm of upper-inner quadrant of left breast in female, estrogen receptor positive (HCC) This is a very pleasant 67 year old female patient with past medical history  significant for type 2 diabetes mellitus, hypertension who had an abnormal mammogram back in 2021 with 6 mm breast mass in the left breast at 930 o'clock as well as left abnormal axillary lymph nodes, did not undergo biopsy at that time as recommended who presented with abnormal mammogram again which led  to further testing.  Left breast needle core biopsy at this time showed invasive ductal carcinoma, grade 2 along with DCIS, ER +80% strong intensity, PR +100% strong intensity, KI 5% and HER2 negative.  Lymph node biopsy was negative which was thought to be discordant.    She was presented in the breast MDC and recommendation was to proceed with surgery upfront followed by adjuvant recommendations based on final pathology.  Final path showed left invasive ductal carcinoma, 13 mm in greatest dimension, Nottingham grade 2, DCIS grade 2, all margins negative, 1 out of 5 axillary lymph nodes with micrometastasis, tumor measuring 1.3 mm. Progs were repeated and issued as an addendum. This is ER moderate- weak staining 80%, PR 100%, Her 2 neg.  Oncotype DX showed a score of 11, there appears to be no benefit from chemotherapy.   Distant recurrence risk at 9 years of 13% with antiestrogen alone.   She is now s/p adjuvant radiation and is here on anastrozole.  Side effects with anastrozole include mild hot flashes, arthralgias. She is willing to continue anastrozole. Recent mammogram neg for malignancy. She however complains of worsening pain and tenderness of left breast, onset a month ago, no major changes on physical exam. We ordered repeat mammo unilateral and [place] and will call her with the results. Reassured her. Encouraged to do carb control for better control of DM  RTC in 6 months.   I spent 30 minutes in the care of this patient including review role of antiestrogen therapy,, mechanism of action, adverse effects of antiestrogen therapy counseling and coordination of care. All questions were answered.  The patient knows to call the clinic with any problems, questions or concerns.    Rachel Moulds, MD 03/14/23

## 2023-03-20 ENCOUNTER — Other Ambulatory Visit: Payer: Self-pay

## 2023-03-20 DIAGNOSIS — N644 Mastodynia: Secondary | ICD-10-CM

## 2023-03-28 ENCOUNTER — Ambulatory Visit: Payer: Self-pay | Admitting: Hematology and Oncology

## 2023-03-28 VITALS — BP 135/54 | Wt 185.0 lb

## 2023-03-28 DIAGNOSIS — Z1211 Encounter for screening for malignant neoplasm of colon: Secondary | ICD-10-CM

## 2023-03-28 DIAGNOSIS — R2232 Localized swelling, mass and lump, left upper limb: Secondary | ICD-10-CM

## 2023-03-28 NOTE — Progress Notes (Signed)
[name] is a 67 y.o. female who presents to [facility] clinic today with complaint of left axilla firmness and pain.    Pap Smear: Pap not smear completed today. Last Pap smear was 09/08/2021 and was normal. Per patient has no history of an abnormal Pap smear. Last Pap smear result is available in Epic.   Physical exam: Breasts Breasts symmetrical. No skin abnormalities bilateral breasts. No nipple retraction bilateral breasts. No nipple discharge bilateral breasts. No lymphadenopathy. No lumps palpated bilateral breasts. Firmness noted to left axilla.   MS DIGITAL DIAG TOMO BILAT  Result Date: 08/16/2022 CLINICAL DATA:  66 year old female for annual follow-up. History of LEFT breast cancer and lumpectomy in 2023. EXAM: DIGITAL DIAGNOSTIC BILATERAL MAMMOGRAM WITH TOMOSYNTHESIS TECHNIQUE: Bilateral digital diagnostic mammography and breast tomosynthesis was performed. COMPARISON:  Previous exam(s). ACR Breast Density Category b: There are scattered areas of fibroglandular density. FINDINGS: 2D and 3D full field views of both breasts and a magnification view of the lumpectomy site demonstrate no suspicious mass, nonsurgical distortion or worrisome calcifications. Surgical changes in the LEFT breast and LEFT axilla are noted. IMPRESSION: No evidence of breast malignancy. RECOMMENDATION: Bilateral diagnostic mammogram in 1 year. I have discussed the findings and recommendations with the patient. If applicable, a reminder letter will be sent to the patient regarding the next appointment. BI-RADS CATEGORY  2: Benign. Electronically Signed   By: Harmon Pier M.D.   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 Radioactive seed localization was performed yesterday in anticipation of  today's lumpectomy and targeted node dissection. EXAM: SPECIMEN RADIOGRAPHS OF THE LEFT BREAST and LEFT AXILLA COMPARISON:  Previous exam(s). FINDINGS: Status post excision of the LEFT breast and status post targeted LEFT axillary node dissection. The radioactive seeds and the ribbon shaped biopsy marker clip are present in the breast specimen, are completely intact, and are marked for pathology. The Tribell clip and the radioactive seed are present in the axillary node specimen, are completely intact, and are marked pathology. IMPRESSION: Specimen radiographs of the LEFT breast and LEFT axilla. Electronically Signed   By: Hulan Saas M.D.   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 Radioactive seed localization was performed yesterday in anticipation of today's lumpectomy and targeted node dissection. EXAM: SPECIMEN RADIOGRAPHS OF THE LEFT BREAST and LEFT AXILLA COMPARISON:  Previous exam(s). FINDINGS: Status post excision of the LEFT breast and status post targeted LEFT axillary node dissection. The radioactive seeds and the ribbon shaped biopsy marker clip are present in the breast specimen, are completely intact, and are marked for pathology. The Tribell clip and the radioactive seed are present in the axillary node specimen, are completely intact, and are marked pathology. IMPRESSION: Specimen radiographs of the LEFT breast and LEFT axilla. Electronically Signed   By: Hulan Saas M.D.   On: 09/22/2021 13:42  MM Breast Surgical Specimen  Result Date: 09/21/2021 CLINICAL DATA:  Assess surgical excision specimen following radioactive seed localization of left breast calcifications. Seeds replaced along the anterior-posterior extents bracketing the area of calcifications.  EXAM: SPECIMEN RADIOGRAPH OF THE LEFT BREAST COMPARISON:  Previous exam(s). FINDINGS: Status post excision of the left breast. Both radioactive seeds and the ribbon shaped biopsy marker clip are present, completely intact, and were marked for pathology. IMPRESSION: Specimen radiograph of the left breast. Electronically Signed   By: Amie Portland M.D.   On: 09/21/2021 16:26  MM LT RADIOACTIVE SEED LOC MAMMO GUIDE  Result Date: 09/20/2021 CLINICAL DATA:  Patient presents for bracketed seed localization of the LEFT breast prior to lumpectomy. EXAM: MAMMOGRAPHIC GUIDED RADIOACTIVE SEED LOCALIZATION OF THE LEFT BREASTx 2 COMPARISON:  Previous exam(s). FINDINGS: Patient presents for radioactive seed localization prior to lumpectomy. I met with the patient and we discussed the procedure of seed localization including benefits and alternatives. We discussed the high likelihood of a successful procedure. We discussed the risks of the procedure including infection, bleeding, tissue injury and further surgery. We discussed the low dose of radioactivity involved in the procedure. Informed, written consent was given. The usual time-out protocol was performed immediately prior to the procedure. An interpreter was present for the entire procedure. Site1: Using mammographic guidance, sterile technique, 1% lidocaine and an I-125 radioactive seed, the anterior aspect of the pleomorphic calcifications was localized using a MEDIAL approach. The follow-up mammogram images confirm the seed in the expected location and were marked for Dr. Donell Beers. Follow-up survey of the patient confirms presence of the radioactive seed. Order number of I-125 seed:  [id number]. Total activity:  0.239 millicuries reference Date: 02/03/2022 Site 2: Using mammographic guidance, sterile technique, 1% lidocaine and an I-125 radioactive seed, the posterior aspect of the pleomorphic calcifications was localized using a MEDIAL approach. The follow-up mammogram  images confirm the seed in the expected location and were marked for Dr. Donell Beers. Follow-up survey of the patient confirms presence of the radioactive seed. Order number of I-125 seed:  [id number]. Total activity:  0.239 millicuries reference Date: 09/06/2021 The patient tolerated the procedure well and was released from the Breast [facility]. She was given instructions regarding seed removal. Of note, linear calcifications are present posterior to the posterior seed. More posterior localization was not possible due to technical considerations. IMPRESSION: Bracketed radioactive seed localization LEFT breast. Suspicious calcifications extend posterior to the more posterior seed. See above. No apparent complications. Electronically Signed   By: Norva Pavlov M.D.   On: 09/20/2021 14:59  MM LT RAD SEED EA ADD LESION LOC MAMMO  Result Date: 09/20/2021 CLINICAL DATA:  Patient presents for bracketed seed localization of the LEFT breast prior to lumpectomy. EXAM: MAMMOGRAPHIC GUIDED RADIOACTIVE SEED LOCALIZATION OF THE LEFT BREASTx 2 COMPARISON:  Previous exam(s). FINDINGS: Patient presents for radioactive seed localization prior to lumpectomy. I met with the patient and we discussed the procedure of seed localization including benefits and alternatives. We discussed the high likelihood of a successful procedure. We discussed the risks of the procedure including infection, bleeding, tissue injury and further surgery. We discussed the low dose of radioactivity involved in the procedure. Informed, written consent was given. The usual time-out protocol was performed immediately prior to the procedure. An interpreter was present for the entire procedure. Site1: Using mammographic guidance, sterile technique, 1% lidocaine and an I-125 radioactive seed, the anterior aspect of the pleomorphic calcifications was localized using a MEDIAL approach. The follow-up mammogram images confirm the seed in the expected location and were  marked for Dr. Donell Beers. Follow-up survey of the patient confirms presence of the radioactive seed. Order number of I-125 seed:  [id number]. Total activity:  0.239 millicuries reference Date: 02/03/2022 Site 2: Using mammographic  guidance, sterile technique, 1% lidocaine and an I-125 radioactive seed, the posterior aspect of the pleomorphic calcifications was localized using a MEDIAL approach. The follow-up mammogram images confirm the seed in the expected location and were marked for Dr. Donell Beers. Follow-up survey of the patient confirms presence of the radioactive seed. Order number of I-125 seed:  [id number]. Total activity:  0.239 millicuries reference Date: 09/06/2021 The patient tolerated the procedure well and was released from the Breast Center. She was given instructions regarding seed removal. Of note, linear calcifications are present posterior to the posterior seed. More posterior localization was not possible due to technical considerations. IMPRESSION: Bracketed radioactive seed localization LEFT breast. Suspicious calcifications extend posterior to the more posterior seed. See above. No apparent complications. Electronically Signed   By: Norva Pavlov M.D.   On: 09/20/2021 14:59  MM CLIP PLACEMENT LEFT  Result Date: 08/26/2021 CLINICAL DATA:  Post ultrasound-guided biopsy of a mass with associated calcifications in the left breast at the 9:30 position and ultrasound-guided biopsy of an abnormal lymph node in the left axilla. EXAM: 3D DIAGNOSTIC LEFT MAMMOGRAM POST ULTRASOUND BIOPSY COMPARISON:  Previous exam(s). FINDINGS: 3D Mammographic images were obtained following ultrasound-guided biopsy of a mass with associated calcifications in the left breast at the 9:30 position and ultrasound-guided biopsy of an abnormal lymph node in the left axilla. A ribbon shaped biopsy marking clip is present at the site of the biopsied mass with associated calcifications in the left breast at the 9:30 position. A tri  bell biopsy marking clip is present at the site of the biopsied lymph node in the left axilla. IMPRESSION: 1. Ribbon shaped biopsy marking clip at site of biopsied mass in the left breast at the 9:30 position. 2. Tri bell shaped biopsy marking clip at site of biopsied lymph node in the left axilla. Final Assessment: Post Procedure Mammograms for Marker Placement Electronically Signed   By: Edwin Cap M.D.   On: 08/26/2021 13:56  MM DIAG BREAST TOMO UNI LEFT  Result Date: 08/12/2021 CLINICAL DATA:  Patient was called back for evaluation of LEFT breast calcifications, focal asymmetry, and possible enlarged LEFT axillary lymph node from screening mammogram at [facility] [place] on 05/20/2021. Patient was seen at the [facility] on 12/25/2019 for mass in the 9:30 o'clock location of the LEFT breast. Biopsy was recommended for this mass. 2 LEFT axillary lymph nodes with abnormal morphology were also identified at the time of that exam and biopsy was recommended of one of these lymph nodes. Biopsy has not been performed. EXAM: DIGITAL DIAGNOSTIC UNILATERAL LEFT MAMMOGRAM WITH TOMOSYNTHESIS AND CAD; ULTRASOUND LEFT BREAST LIMITED TECHNIQUE: Left digital diagnostic mammography and breast tomosynthesis was performed. The images were evaluated with computer-aided detection.; Targeted ultrasound examination of the left breast was performed. COMPARISON:  Multiple prior studies including 05/20/2021 [facility] [place]), 12/25/2019 and earlier (from the [facility]) ACR Breast Density Category b: There are scattered areas of fibroglandular density. FINDINGS: Spot compression and magnified views are performed of the LEFT breast. These views demonstrate a spiculated mass associated with linear and pleomorphic calcifications in the UPPER INNER QUADRANT of the LEFT breast. The calcifications and mass together span 3.7 x 1.8 x 1.2 centimeters. Targeted ultrasound is  performed, showing an oval mass with indistinct margins and posterior acoustic shadowing in the 9:30 o'clock location of the LEFT breast 5 centimeters from the nipple. No internal blood flow identified on Doppler evaluation. Mass measures 0.7 x 0.9 x  0.5 centimeters. At real-time imaging, there is associated distortion in this region. Evaluation of the LEFT axilla shows 2 morphologically abnormal lymph nodes, 1 with cortical thickening of 1.0 centimeters. A second lymph node has cortical thickening of 3 millimeters. Other LEFT axillary lymph nodes have normal morphology. IMPRESSION: 1. Suspicious mass in the 9:30 o'clock location of the LEFT breast warranting tissue diagnosis. 2. Two LEFT axillary lymph nodes with abnormal morphology. RECOMMENDATION: 1. Recommend ultrasound-guided core biopsy of mass in the 9:30 o'clock location of the LEFT breast. Given the associated suspicious calcifications in this region, I would recommend bracketed localization to include calcifications if excision is needed. 2. Recommend ultrasound-guided core biopsy of the larger of the LEFT axillary lymph nodes. I have discussed the findings and recommendations with the patient with the assistance of an interpreter. If applicable, a reminder letter will be sent to the patient regarding the next appointment. BI-RADS CATEGORY  5: Highly suggestive of malignancy. Electronically Signed   By: Norva Pavlov M.D.   On: 08/12/2021 16:25   MM DIAG BREAST TOMO UNI LEFT  Result Date: 12/25/2019 CLINICAL DATA:  Patient recalled from screening for left breast mass/distortion and calcifications. EXAM: DIGITAL DIAGNOSTIC LEFT MAMMOGRAM WITH CAD AND TOMO ULTRASOUND LEFT BREAST COMPARISON:  Previous exam(s). ACR Breast Density Category b: There are scattered areas of fibroglandular density. FINDINGS: Spot compression views and magnification views of the left breast were obtained. Within the upper inner left breast there is a persistent irregular mass  with associated distortion. Extending anterior and lateral from this mass are suspicious pleomorphic calcifications measuring approximately 3.0 x 1.6 cm in dimension. Mammographic images were processed with CAD. Targeted ultrasound is performed, showing a 6 x 9 x 5 mm irregular hypoechoic mass left breast 9:30 o'clock 5 cm from nipple. There are 2 cortically thickened nodes within the left axilla, the largest demonstrating a cortex of 11 mm. IMPRESSION: 1. Suspicious mass left breast 9:30 o'clock 5 cm from the nipple corresponding with focal area of distortion on mammography. 2. There are suspicious pleomorphic calcifications within the upper inner left breast at the site of the above described mass extending anterior and lateral from the mass. RECOMMENDATION: 1. Ultrasound-guided core needle biopsy left breast mass 9:30 o'clock. 2. Stereotactic guided core needle biopsy of the anterior lateral most aspect of the pleomorphic calcifications within the upper inner left breast at the site of suspicious mass. 3. Ultrasound-guided core needle biopsy of 1 of the cortically thickened left axillary lymph nodes. I have discussed the findings and recommendations with the patient. If applicable, a reminder letter will be sent to the patient regarding the next appointment. BI-RADS CATEGORY  5: Highly suggestive of malignancy. Electronically Signed   By: Annia Belt M.D.   On: 12/25/2019 12:44   MS DIGITAL SCREENING TOMO BILATERAL  Addendum Date: 12/19/2019   ADDENDUM REPORT: 12/19/2019 13:02 ADDENDUM: The possible distortion associated with calcifications and possible mass is in the LEFT breast. I incorrectly stated the right breast on the report of the screening mammogram. I also now have available the tomosynthesis MLO images which were not available at the time of initial interpretation. These do not need to be repeated at the time of the diagnostic mammogram. Electronically Signed   By: Hulan Saas M.D.   On:  12/19/2019 13:02   Result Date: 12/19/2019 CLINICAL DATA:  Screening. Please note that the LEFT MLO tomosynthesis images are currently unavailable due to technical difficulties, though all of the remaining images, including the synthesized  LEFT MLO image, is available. EXAM: DIGITAL SCREENING BILATERAL MAMMOGRAM WITH TOMO AND CAD COMPARISON:  01/20/2012. ACR Breast Density Category b: There are scattered areas of fibroglandular density. FINDINGS: In the right breast, possible distortion associated with calcifications and associated with a possible mass warrants further evaluation. In the left breast, no findings suspicious for malignancy. Images were processed with CAD. IMPRESSION: Further evaluation is suggested for possible distortion associated with calcifications and associated with a possible mass in the right breast. RECOMMENDATION: Diagnostic mammogram and possibly ultrasound of the right breast. The LEFT MLO tomosynthesis images can repeated at the time of the diagnostic mammogram. (Code:[id number]) The patient will be contacted regarding the findings, and additional imaging will be scheduled. BI-RADS CATEGORY  0: Incomplete. Need additional imaging evaluation and/or prior mammograms for comparison. Electronically Signed: By: Hulan Saas M.D. On: 12/13/2019 16:10        Pelvic/Bimanual Pap is not indicated today    Smoking History: Patient is a former smoker and was not referred to quit line.    Patient Navigation: Patient education provided. Access to services provided for patient through [facility] program. Natale Lay interpreter provided. No transportation provided   Colorectal Cancer Screening: Per patient has never had colonoscopy completed No complaints today. FIT test given.    Breast and Cervical Cancer Risk Assessment: Patient does not have family history of breast cancer, known genetic mutations, or radiation treatment to the chest before age 46. Patient does not have history  of cervical dysplasia, immunocompromised, or DES exposure in-utero.  Risk Assessment   No risk assessment data         A: [facility]CP exam without pap smear Complaint of left axillary firmness and pain x 3 months. Personal history of left breast cancer treated with lumpectomy, radiation and anastrazole.   P: Referred patient to the [facility] for a diagnostic mammogram. Appointment scheduled 03/30/2023.  Pascal Lux, NP 03/28/2023 2:48 PM

## 2023-03-28 NOTE — Patient Instructions (Addendum)
Taught Anders Simmonds about self breast awareness and gave educational materials to take home. Patient did not need a Pap smear today due to last Pap smear was in 09/08/2021 per patient. Let her know [facility]CP will cover Pap smears every 5 years unless has a history of abnormal Pap smears. Referred patient to the [facility] for diagnostic mammogram. Appointment scheduled for 03/28/2023. Patient aware of appointment and will be there. Let patient know will follow up with her within the next couple weeks with results. Anders Simmonds verbalized understanding.  Pascal Lux, NP 2:51 PM

## 2023-03-30 ENCOUNTER — Ambulatory Visit
Admission: RE | Admit: 2023-03-30 | Discharge: 2023-03-30 | Disposition: A | Discharge status: Home / Self Care | Payer: No Typology Code available for payment source | Source: Ambulatory Visit | Attending: Obstetrics and Gynecology | Admitting: Obstetrics and Gynecology

## 2023-03-30 ENCOUNTER — Other Ambulatory Visit: Payer: Self-pay | Admitting: Obstetrics and Gynecology

## 2023-03-30 ENCOUNTER — Ambulatory Visit: Payer: No Typology Code available for payment source

## 2023-03-30 DIAGNOSIS — N644 Mastodynia: Secondary | ICD-10-CM

## 2023-04-06 ENCOUNTER — Inpatient Hospital Stay: Payer: No Typology Code available for payment source | Admitting: Hematology and Oncology

## 2023-04-06 ENCOUNTER — Other Ambulatory Visit: Payer: Self-pay | Admitting: *Deleted

## 2023-04-06 DIAGNOSIS — C50212 Malignant neoplasm of upper-inner quadrant of left female breast: Secondary | ICD-10-CM

## 2023-04-06 DIAGNOSIS — Z170 Estrogen receptor positive status [ER+]: Secondary | ICD-10-CM

## 2023-04-06 NOTE — Assessment & Plan Note (Signed)
This is a very pleasant 67 year old female patient with past medical history significant for type 2 diabetes mellitus, hypertension who had an abnormal mammogram back in 2021 with 6 mm breast mass in the left breast at 930 o'clock as well as left abnormal axillary lymph nodes, did not undergo biopsy at that time as recommended who presented with abnormal mammogram again which led to further testing.  Left breast needle core biopsy at this time showed invasive ductal carcinoma, grade 2 along with DCIS, ER +80% strong intensity, PR +100% strong intensity, KI 5% and HER2 negative.  Lymph node biopsy was negative which was thought to be discordant.    She was presented in the breast [facility] and recommendation was to proceed with surgery upfront followed by adjuvant recommendations based on final pathology.  Final path showed left invasive ductal carcinoma, 13 mm in greatest dimension, Nottingham grade 2, DCIS grade 2, all margins negative, 1 out of 5 axillary lymph nodes with micrometastasis, tumor measuring 1.3 mm. Progs were repeated and issued as an addendum. This is ER moderate- weak staining 80%, PR 100%, Her 2 neg.  Oncotype DX showed a score of 11, there appears to be no benefit from chemotherapy.   Distant recurrence risk at 9 years of 13% with antiestrogen alone.   She is now s/p adjuvant radiation and is on anastrozole.  She is here for telephone follow-up. She had some left breast tenderness, hence had a diagnostic mammogram No concern on mammogram, conveyed the results using a certified spanish interpreter.   She was concerned why the ultrasound has not been done.  Since the mammogram was normal, there was no additional ultrasound needed.  She also reported some tenderness since she had a mammogram.  She is using as needed ibuprofen.  She can continue this for a week or so and she should notice some relief.    She was very thankful for the phone call.  She can continue anastrozole and return to clinic  as scheduled.

## 2023-04-06 NOTE — Progress Notes (Signed)
[facility] CONSULT NOTE  Patient Care Team: Selinda Flavin, MD as PCP - General (Family Medicine) Almond Lint, MD as Consulting Physician (General Surgery) Rachel Moulds, MD as Consulting Physician (Hematology and Oncology) Dorothy Puffer, MD as Consulting Physician (Radiation Oncology) Axel Filler, Larna Daughters, NP as Nurse Practitioner (Hematology and Oncology) Maryclare Labrador, RN as Registered Nurse  CHIEF COMPLAINTS/PURPOSE OF CONSULTATION:  Follow up  HISTORY OF PRESENTING ILLNESS:   Kelli Vaughn 67 y.o. female is here because of recent diagnosis of left IDC. I reviewed her records extensively and collaborated the history with the patient.  SUMMARY OF ONCOLOGIC HISTORY: Oncology History Overview Note  Patient was called back for evaluation of LEFT breast calcifications, focal asymmetry, and possible enlarged LEFT axillary lymph node from screening mammogram at [facility] [place] on 05/20/2021. 08/12/2021, Suspicious mass in the 9:30 o'clock location of the LEFT breast warranting tissue diagnosis. Two LEFT axillary lymph nodes with abnormal morphology. 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 Lymph node biopsy of left axilla negative, lymph node biopsy results were thought to be discordant.   Patient does recollect being asked to schedule biopsy back in 2021 for abnormal mammogram but she was worried or had this misinformation that biopsy can let the breast cancer spread to other parts hence she decided not to proceed with biopsy at that time. She is here with her 2 sons, speaks in [place], Spanish interpreter present at the time of my visit.  Son also help with the translation.   Malignant neoplasm of upper-inner quadrant of left breast in female, estrogen receptor positive (HCC)  08/31/2021 Initial Diagnosis   Malignant neoplasm of upper-inner  quadrant of left breast in female, estrogen receptor positive (HCC)   09/01/2021 Cancer Staging   Staging form: Breast, AJCC 8th Edition - Clinical stage from 09/01/2021: Stage Unknown (cT1b, cNX, cM0, G2, ER+, PR+, HER2-) - Signed by Rachel Moulds, MD on 09/01/2021 Stage prefix: Initial diagnosis Histologic grading system: 3 grade system    Genetic Testing   [facility] CancerNext Panel was Negative. 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 Report date is 09/20/2021.  The CancerNext gene panel offered by [employer] includes sequencing, rearrangement analysis, and RNA analysis for the following 36 genes:   APC, ATM, AXIN2, BARD1, BMPR1A, BRCA1, BRCA2, BRIP1, CDH1, CDK4, CDKN2A, CHEK2, DICER1, HOXB13, EPCAM, GREM1, MLH1, MSH2, MSH3, MSH6, MUTYH, NBN, NF1, NTHL1, PALB2, PMS2, POLD1, POLE, PTEN, RAD51C, RAD51D, RECQL, SMAD4, SMARCA4, STK11, and TP53.    09/21/2021 Surgery   Patient had left breast lumpectomy with a sentinel lymph node excision on 09/21/2021.  Final pathology showed invasive carcinoma of no special type, 13 mm in greatest dimension, grade 2, DCIS grade 2, all margins negative for invasive carcinoma and DCIS, distance from invasive carcinoma to closest margin is 0.5 mm, posterior margin and distance from DCIS to closest margin, 1 mm posterior margin.  5 axillary lymph nodes removed.  1 axillary lymph node with micrometastasis, metastatic deposit of 0.3 mm in greatest dimension.   Prognostics ER +80% moderate weak staining intensity, PR +100% strong staining intensity and HER2 negative    Oncotype testing   Oncotype DX score of 11.  Distant recurrence risk at 9 years of 13% with antiestrogen therapy.  No benefit from chemotherapy   11/11/2021 - 12/09/2021 Radiation Therapy    Treatment  Dates: 11/11/2021 through 12/09/2021 Site Technique Total Dose (Gy) Dose per Fx (Gy) Completed Fx Beam Energies  Breast, Left: Breast_L 3D 42.56/42.56 2.66 16/16 10X   Breast, Left: Breast_L_Bst 3D 8/8 2 4/4 6X, 10X      12/2021 -  Anti-estrogen oral therapy   Anastrozole    Interval history  Patient is here for telephone follow up. She is doing ok except for left breast tenderness,  tenderness got worse after mammogram. She denies any other complaints today. She was just wondering why [place] is not done. Rest of the pertinent 10 point ROS reviewed and negative  MEDICAL HISTORY:  Past Medical History:  Diagnosis Date   Breast cancer (HCC)    left breast   Complication of anesthesia    Diabetes mellitus without complication (HCC)    TYPE II   Glaucoma    Hypertension    Personal history of radiation therapy    PONV (postoperative nausea and vomiting)     SURGICAL HISTORY: Past Surgical History:  Procedure Laterality Date   BREAST LUMPECTOMY WITH RADIOACTIVE SEED AND SENTINEL LYMPH NODE BIOPSY Left 09/21/2021   Procedure: LEFT BREAST LUMPECTOMY WITH RADIOACTIVE SEED X2 AND SENTINEL LYMPH NODE BIOPSY;  Surgeon: Almond Lint, MD;  Location: [facility] OR;  Service: General;  Laterality: Left;   CESAREAN SECTION     X2   EYE SURGERY Bilateral 2019   cataract surgery   RADIOACTIVE SEED GUIDED AXILLARY SENTINEL LYMPH NODE Left 09/21/2021   Procedure: RADIOACTIVE SEED GUIDED AXILLARY SENTINEL LYMPH NODE BIOPSY;  Surgeon: Almond Lint, MD;  Location: [facility] OR;  Service: General;  Laterality: Left;    SOCIAL HISTORY: Social History   Socioeconomic History   Marital status: Divorced    Spouse name: Not on file   Number of children: 2   Years of education: Not on file   Highest education level: Some college, no degree  Occupational History   Not on file  Tobacco Use   Smoking status: Former    Types: Cigarettes   Smokeless tobacco: Never  Vaping Use   Vaping status: Never Used  Substance and Sexual Activity   Alcohol use: No   Drug use: No   Sexual activity: Not Currently    Birth control/protection: Post-menopausal  Other Topics Concern    Not on file  Social History Narrative   Not on file   Social Determinants of Health   Financial Resource Strain: Not on file  Food Insecurity: No Food Insecurity (03/28/2023)   Hunger Vital Sign    Worried About Running Out of Food in the Last Year: Never true    Ran Out of Food in the Last Year: Never true  Transportation Needs: No Transportation Needs (03/28/2023)   PRAPARE - Administrator, Civil Service (Medical): No    Lack of Transportation (Non-Medical): No  Physical Activity: Not on file  Stress: Not on file  Social Connections: Not on file  Intimate Partner Violence: Not on file    FAMILY HISTORY: Family History  Problem Relation Age of Onset   Heart disease Mother    Bone cancer Father    Breast cancer Sister        dx. 30s   Stomach cancer Sister        dx. 30s, unsure if second primary or metastasis   Breast cancer Niece     ALLERGIES:  is allergic to insulin.  MEDICATIONS:  Current Outpatient Medications  Medication Sig Dispense Refill   anastrozole (  ARIMIDEX) 1 MG tablet Take 1 tablet (1 mg total) by mouth daily. 90 tablet 3   aspirin EC 81 MG tablet Take 81 mg by mouth at bedtime.     cholecalciferol (VITAMIN D3) 25 MCG (1000 UNIT) tablet Take 1,000 Units by mouth daily. (Patient not taking: Reported on 03/28/2023)     gabapentin (NEURONTIN) 300 MG capsule Take 300 mg by mouth daily at 12 noon. (Patient not taking: Reported on 03/28/2023)     glipiZIDE (GLUCOTROL) 10 MG tablet Take 10 mg by mouth daily before breakfast.     hydrochlorothiazide (HYDRODIURIL) 25 MG tablet Take 25 mg by mouth in the morning.     HYDROcodone-acetaminophen (NORCO/VICODIN) 5-325 MG tablet Take 1 tablet by mouth every 6 (six) hours as needed. (Patient not taking: Reported on 03/28/2023) 14 tablet 0   ibuprofen (ADVIL,MOTRIN) 800 MG tablet Take 1,600 mg by mouth every 8 (eight) hours as needed for moderate pain.     insulin glargine (LANTUS) 100 UNIT/ML injection Inject 35  Units into the skin 2 (two) times daily.     latanoprost (XALATAN) 0.005 % ophthalmic solution 1 drop every morning.     lisinopril (ZESTRIL) 10 MG tablet Take 10 mg by mouth in the morning.     omeprazole (PRILOSEC) 40 MG capsule Take by mouth.     SitaGLIPtin-MetFORMIN HCl (JANUMET XR) 50-1000 MG TB24 Take 1 tablet by mouth in the morning and at bedtime.     Vitamin D, Ergocalciferol, (DRISDOL) 1.25 MG (50000 UNIT) CAPS capsule Take 1 capsule (50,000 Units total) by mouth every 7 (seven) days. (Patient not taking: Reported on 08/16/2022) 12 capsule 0   No current facility-administered medications for this visit.     PHYSICAL EXAMINATION: ECOG PERFORMANCE STATUS: 0 - Asymptomatic  VS and PE not done, telephone visit.  LABORATORY DATA:  I have reviewed the data as listed Lab Results  Component Value Date   WBC 9.2 09/09/2022   HGB 13.5 09/09/2022   HCT 40.7 09/09/2022   MCV 87.5 09/09/2022   PLT 304 09/09/2022   Lab Results  Component Value Date   NA 137 09/09/2022   K 4.1 09/09/2022   CL 103 09/09/2022   CO2 27 09/09/2022    RADIOGRAPHIC STUDIES: I have personally reviewed the radiological reports and agreed with the findings in the report.  ASSESSMENT AND PLAN:  Malignant neoplasm of upper-inner quadrant of left breast in female, estrogen receptor positive (HCC) This is a very pleasant 67 year old female patient with past medical history significant for type 2 diabetes mellitus, hypertension who had an abnormal mammogram back in 2021 with 6 mm breast mass in the left breast at 930 o'clock as well as left abnormal axillary lymph nodes, did not undergo biopsy at that time as recommended who presented with abnormal mammogram again which led to further testing.  Left breast needle core biopsy at this time showed invasive ductal carcinoma, grade 2 along with DCIS, ER +80% strong intensity, PR +100% strong intensity, KI 5% and HER2 negative.  Lymph node biopsy was negative which was  thought to be discordant.    She was presented in the breast MDC and recommendation was to proceed with surgery upfront followed by adjuvant recommendations based on final pathology.  Final path showed left invasive ductal carcinoma, 13 mm in greatest dimension, Nottingham grade 2, DCIS grade 2, all margins negative, 1 out of 5 axillary lymph nodes with micrometastasis, tumor measuring 1.3 mm. Progs were repeated and issued as an  addendum. This is ER moderate- weak staining 80%, PR 100%, Her 2 neg.  Oncotype DX showed a score of 11, there appears to be no benefit from chemotherapy.   Distant recurrence risk at 9 years of 13% with antiestrogen alone.   She is now s/p adjuvant radiation and is on anastrozole.  She is here for telephone follow-up. She had some left breast tenderness, hence had a diagnostic mammogram No concern on mammogram, conveyed the results using a certified spanish interpreter.   She was concerned why the ultrasound has not been done.  Since the mammogram was normal, there was no additional ultrasound needed.  She also reported some tenderness since she had a mammogram.  She is using as needed ibuprofen.  She can continue this for a week or so and she should notice some relief.    She was very thankful for the phone call.  She can continue anastrozole and return to clinic as scheduled.   I connected with  Kelli Vaughn on 04/06/23 by a telephone application and verified that I am speaking with the correct person using two identifiers.   I discussed the limitations of evaluation and management by telemedicine. The patient expressed understanding and agreed to proceed.  Total time spent: 8 min  All questions were answered. The patient knows to call the clinic with any problems, questions or concerns.    Rachel Moulds, MD 04/06/23

## 2023-04-09 LAB — FECAL OCCULT BLOOD, IMMUNOCHEMICAL: Fecal Occult Bld: POSITIVE — AB

## 2023-04-10 ENCOUNTER — Telehealth: Payer: Self-pay

## 2023-04-10 DIAGNOSIS — R195 Other fecal abnormalities: Secondary | ICD-10-CM

## 2023-04-10 NOTE — Telephone Encounter (Signed)
Using [facility] Interpreter, Joslyn Hy, pt has been advised to of her positive FIT result. Pt wishes to move forward with the referral at this time. Pt understands that a referral is being sent to [facility] and the visit and all services she receives are not covered by the [facility] program.

## 2023-05-04 ENCOUNTER — Other Ambulatory Visit: Payer: Self-pay | Admitting: *Deleted

## 2023-05-04 DIAGNOSIS — C50212 Malignant neoplasm of upper-inner quadrant of left female breast: Secondary | ICD-10-CM

## 2023-05-08 ENCOUNTER — Ambulatory Visit: Payer: No Typology Code available for payment source

## 2023-05-11 ENCOUNTER — Ambulatory Visit (HOSPITAL_BASED_OUTPATIENT_CLINIC_OR_DEPARTMENT_OTHER)
Admission: RE | Admit: 2023-05-11 | Discharge: 2023-05-11 | Disposition: A | Discharge status: Home / Self Care | Payer: No Typology Code available for payment source | Source: Ambulatory Visit | Attending: Hematology and Oncology | Admitting: Hematology and Oncology

## 2023-05-11 ENCOUNTER — Other Ambulatory Visit: Payer: Self-pay | Admitting: *Deleted

## 2023-05-11 DIAGNOSIS — C50212 Malignant neoplasm of upper-inner quadrant of left female breast: Secondary | ICD-10-CM | POA: Insufficient documentation

## 2023-05-11 DIAGNOSIS — Z170 Estrogen receptor positive status [ER+]: Secondary | ICD-10-CM | POA: Insufficient documentation

## 2023-05-11 MED ORDER — ANASTROZOLE 1 MG PO TABS: 1.0000 mg | ORAL_TABLET | Freq: Every day | ORAL | 3 refills | Status: DC

## 2023-05-12 ENCOUNTER — Telehealth: Payer: Self-pay | Admitting: Hematology and Oncology

## 2023-05-12 NOTE — Telephone Encounter (Signed)
 Left patient a vm regarding upcoming appointment

## 2023-05-16 ENCOUNTER — Inpatient Hospital Stay
Payer: No Typology Code available for payment source | Attending: Hematology and Oncology | Admitting: Hematology and Oncology

## 2023-05-16 ENCOUNTER — Telehealth: Payer: Self-pay

## 2023-05-16 DIAGNOSIS — C50212 Malignant neoplasm of upper-inner quadrant of left female breast: Secondary | ICD-10-CM | POA: Insufficient documentation

## 2023-05-16 DIAGNOSIS — M810 Age-related osteoporosis without current pathological fracture: Secondary | ICD-10-CM | POA: Insufficient documentation

## 2023-05-16 DIAGNOSIS — Z800 Family history of malignant neoplasm of digestive organs: Secondary | ICD-10-CM | POA: Insufficient documentation

## 2023-05-16 DIAGNOSIS — Z87891 Personal history of nicotine dependence: Secondary | ICD-10-CM | POA: Insufficient documentation

## 2023-05-16 DIAGNOSIS — Z923 Personal history of irradiation: Secondary | ICD-10-CM | POA: Insufficient documentation

## 2023-05-16 DIAGNOSIS — Z803 Family history of malignant neoplasm of breast: Secondary | ICD-10-CM | POA: Insufficient documentation

## 2023-05-16 DIAGNOSIS — Z79811 Long term (current) use of aromatase inhibitors: Secondary | ICD-10-CM | POA: Insufficient documentation

## 2023-05-16 DIAGNOSIS — E119 Type 2 diabetes mellitus without complications: Secondary | ICD-10-CM | POA: Insufficient documentation

## 2023-05-16 DIAGNOSIS — Z170 Estrogen receptor positive status [ER+]: Secondary | ICD-10-CM | POA: Insufficient documentation

## 2023-05-16 NOTE — Telephone Encounter (Signed)
Dental clearance form faxed to [facility] as requested by MD. Fax confirmation received.

## 2023-05-16 NOTE — Progress Notes (Signed)
[facility] CONSULT NOTE  Patient Care Team: Selinda Flavin, MD as PCP - General (Family Medicine) Almond Lint, MD as Consulting Physician (General Surgery) Rachel Moulds, MD as Consulting Physician (Hematology and Oncology) Dorothy Puffer, MD as Consulting Physician (Radiation Oncology) Axel Filler, Larna Daughters, NP as Nurse Practitioner (Hematology and Oncology) Maryclare Labrador, RN as Registered Nurse  CHIEF COMPLAINTS/PURPOSE OF CONSULTATION:  Follow up  HISTORY OF PRESENTING ILLNESS:   Kelli Vaughn 67 y.o. female is here because of recent diagnosis of left IDC. I reviewed her records extensively and collaborated the history with the patient.  SUMMARY OF ONCOLOGIC HISTORY: Oncology History Overview Note  Patient was called back for evaluation of LEFT breast calcifications, focal asymmetry, and possible enlarged LEFT axillary lymph node from screening mammogram at [facility] [place] on 05/20/2021. 08/12/2021, Suspicious mass in the 9:30 o'clock location of the LEFT breast warranting tissue diagnosis. Two LEFT axillary lymph nodes with abnormal morphology. 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 Lymph node biopsy of left axilla negative, lymph node biopsy results were thought to be discordant.   Patient does recollect being asked to schedule biopsy back in 2021 for abnormal mammogram but she was worried or had this misinformation that biopsy can let the breast cancer spread to other parts hence she decided not to proceed with biopsy at that time. She is here with her 2 sons, speaks in [place], Spanish interpreter present at the time of my visit.  Son also help with the translation.   Malignant neoplasm of upper-inner quadrant of left breast in female, estrogen receptor positive (HCC)  08/31/2021 Initial Diagnosis   Malignant neoplasm of upper-inner  quadrant of left breast in female, estrogen receptor positive (HCC)   09/01/2021 Cancer Staging   Staging form: Breast, AJCC 8th Edition - Clinical stage from 09/01/2021: Stage Unknown (cT1b, cNX, cM0, G2, ER+, PR+, HER2-) - Signed by Rachel Moulds, MD on 09/01/2021 Stage prefix: Initial diagnosis Histologic grading system: 3 grade system    Genetic Testing   [facility] CancerNext Panel was Negative. 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 Report date is 09/20/2021.  The CancerNext gene panel offered by [employer] includes sequencing, rearrangement analysis, and RNA analysis for the following 36 genes:   APC, ATM, AXIN2, BARD1, BMPR1A, BRCA1, BRCA2, BRIP1, CDH1, CDK4, CDKN2A, CHEK2, DICER1, HOXB13, EPCAM, GREM1, MLH1, MSH2, MSH3, MSH6, MUTYH, NBN, NF1, NTHL1, PALB2, PMS2, POLD1, POLE, PTEN, RAD51C, RAD51D, RECQL, SMAD4, SMARCA4, STK11, and TP53.    09/21/2021 Surgery   Patient had left breast lumpectomy with a sentinel lymph node excision on 09/21/2021.  Final pathology showed invasive carcinoma of no special type, 13 mm in greatest dimension, grade 2, DCIS grade 2, all margins negative for invasive carcinoma and DCIS, distance from invasive carcinoma to closest margin is 0.5 mm, posterior margin and distance from DCIS to closest margin, 1 mm posterior margin.  5 axillary lymph nodes removed.  1 axillary lymph node with micrometastasis, metastatic deposit of 0.3 mm in greatest dimension.   Prognostics ER +80% moderate weak staining intensity, PR +100% strong staining intensity and HER2 negative    Oncotype testing   Oncotype DX score of 11.  Distant recurrence risk at 9 years of 13% with antiestrogen therapy.  No benefit from chemotherapy   11/11/2021 - 12/09/2021 Radiation Therapy    Treatment  Dates: 11/11/2021 through 12/09/2021 Site Technique Total Dose (Gy) Dose per Fx (Gy) Completed Fx Beam Energies  Breast, Left: Breast_L 3D 42.56/42.56 2.66 16/16 10X   Breast, Left: Breast_L_Bst 3D 8/8 2 4/4 6X, 10X      12/2021 -  Anti-estrogen oral therapy   Anastrozole    Interval history  Patient is here for telephone follow up. Spanish interpreter was used for the entirety of the conversation.  MEDICAL HISTORY:  Past Medical History:  Diagnosis Date   Breast cancer (HCC)    left breast   Complication of anesthesia    Diabetes mellitus without complication (HCC)    TYPE II   Glaucoma    Hypertension    Personal history of radiation therapy    PONV (postoperative nausea and vomiting)     SURGICAL HISTORY: Past Surgical History:  Procedure Laterality Date   BREAST LUMPECTOMY WITH RADIOACTIVE SEED AND SENTINEL LYMPH NODE BIOPSY Left 09/21/2021   Procedure: LEFT BREAST LUMPECTOMY WITH RADIOACTIVE SEED X2 AND SENTINEL LYMPH NODE BIOPSY;  Surgeon: Almond Lint, MD;  Location: [facility] OR;  Service: General;  Laterality: Left;   CESAREAN SECTION     X2   EYE SURGERY Bilateral 2019   cataract surgery   RADIOACTIVE SEED GUIDED AXILLARY SENTINEL LYMPH NODE Left 09/21/2021   Procedure: RADIOACTIVE SEED GUIDED AXILLARY SENTINEL LYMPH NODE BIOPSY;  Surgeon: Almond Lint, MD;  Location: [facility] OR;  Service: General;  Laterality: Left;    SOCIAL HISTORY: Social History   Socioeconomic History   Marital status: Divorced    Spouse name: Not on file   Number of children: 2   Years of education: Not on file   Highest education level: Some college, no degree  Occupational History   Not on file  Tobacco Use   Smoking status: Former    Types: Cigarettes   Smokeless tobacco: Never  Vaping Use   Vaping status: Never Used  Substance and Sexual Activity   Alcohol use: No   Drug use: No   Sexual activity: Not Currently    Birth control/protection: Post-menopausal  Other Topics Concern   Not on file  Social History Narrative   Not on file   Social Determinants of Health   Financial Resource Strain: Not on file  Food Insecurity: No Food  Insecurity (03/28/2023)   Hunger Vital Sign    Worried About Running Out of Food in the Last Year: Never true    Ran Out of Food in the Last Year: Never true  Transportation Needs: No Transportation Needs (03/28/2023)   PRAPARE - Administrator, Civil Service (Medical): No    Lack of Transportation (Non-Medical): No  Physical Activity: Not on file  Stress: Not on file  Social Connections: Not on file  Intimate Partner Violence: Not on file    FAMILY HISTORY: Family History  Problem Relation Age of Onset   Heart disease Mother    Bone cancer Father    Breast cancer Sister        dx. 30s   Stomach cancer Sister        dx. 30s, unsure if second primary or metastasis   Breast cancer Niece     ALLERGIES:  is allergic to insulin.  MEDICATIONS:  Current Outpatient Medications  Medication Sig Dispense Refill   anastrozole (ARIMIDEX) 1 MG tablet Take 1 tablet (1 mg total) by mouth daily. 90 tablet 3   aspirin EC 81 MG tablet Take 81 mg by mouth at bedtime.  cholecalciferol (VITAMIN D3) 25 MCG (1000 UNIT) tablet Take 1,000 Units by mouth daily. (Patient not taking: Reported on 03/28/2023)     gabapentin (NEURONTIN) 300 MG capsule Take 300 mg by mouth daily at 12 noon. (Patient not taking: Reported on 03/28/2023)     glipiZIDE (GLUCOTROL) 10 MG tablet Take 10 mg by mouth daily before breakfast.     hydrochlorothiazide (HYDRODIURIL) 25 MG tablet Take 25 mg by mouth in the morning.     HYDROcodone-acetaminophen (NORCO/VICODIN) 5-325 MG tablet Take 1 tablet by mouth every 6 (six) hours as needed. (Patient not taking: Reported on 03/28/2023) 14 tablet 0   ibuprofen (ADVIL,MOTRIN) 800 MG tablet Take 1,600 mg by mouth every 8 (eight) hours as needed for moderate pain.     insulin glargine (LANTUS) 100 UNIT/ML injection Inject 35 Units into the skin 2 (two) times daily.     latanoprost (XALATAN) 0.005 % ophthalmic solution 1 drop every morning.     lisinopril (ZESTRIL) 10 MG tablet  Take 10 mg by mouth in the morning.     omeprazole (PRILOSEC) 40 MG capsule Take by mouth.     SitaGLIPtin-MetFORMIN HCl (JANUMET XR) 50-1000 MG TB24 Take 1 tablet by mouth in the morning and at bedtime.     Vitamin D, Ergocalciferol, (DRISDOL) 1.25 MG (50000 UNIT) CAPS capsule Take 1 capsule (50,000 Units total) by mouth every 7 (seven) days. (Patient not taking: Reported on 08/16/2022) 12 capsule 0   No current facility-administered medications for this visit.     PHYSICAL EXAMINATION: ECOG PERFORMANCE STATUS: 0 - Asymptomatic  VS and PE not done, telephone visit.  LABORATORY DATA:  I have reviewed the data as listed Lab Results  Component Value Date   WBC 9.2 09/09/2022   HGB 13.5 09/09/2022   HCT 40.7 09/09/2022   MCV 87.5 09/09/2022   PLT 304 09/09/2022   Lab Results  Component Value Date   NA 137 09/09/2022   K 4.1 09/09/2022   CL 103 09/09/2022   CO2 27 09/09/2022    RADIOGRAPHIC STUDIES: I have personally reviewed the radiological reports and agreed with the findings in the report.  ASSESSMENT AND PLAN:  Malignant neoplasm of upper-inner quadrant of left breast in female, estrogen receptor positive (HCC) This is a very pleasant 67 year old female patient with past medical history significant for type 2 diabetes mellitus, hypertension who had an abnormal mammogram back in 2021 with 6 mm breast mass in the left breast at 930 o'clock as well as left abnormal axillary lymph nodes, did not undergo biopsy at that time as recommended who presented with abnormal mammogram again which led to further testing.  Left breast needle core biopsy at this time showed invasive ductal carcinoma, grade 2 along with DCIS, ER +80% strong intensity, PR +100% strong intensity, KI 5% and HER2 negative.  Lymph node biopsy was negative which was thought to be discordant.    She was presented in the breast MDC and recommendation was to proceed with surgery upfront followed by adjuvant  recommendations based on final pathology.  Final path showed left invasive ductal carcinoma, 13 mm in greatest dimension, Nottingham grade 2, DCIS grade 2, all margins negative, 1 out of 5 axillary lymph nodes with micrometastasis, tumor measuring 1.3 mm. Progs were repeated and issued as an addendum. This is ER moderate- weak staining 80%, PR 100%, Her 2 neg.  Oncotype DX showed a score of 11, there appears to be no benefit from chemotherapy.   Distant recurrence risk  at 9 years of 13% with antiestrogen alone.   She is now s/p adjuvant radiation and is on anastrozole.  This was a follow-up telephone visit to review her bone density results.  I have discussed that she has osteoporosis and will need treatment especially since she is also on anastrozole.  We have discussed about role of bisphosphonate and management of osteoporosis.  She did see a dentist in [place], provided her phone number as well as name of the clinic.  Will try to fax dental clearance to them.  Once we obtain dental clearance, we will start her on bisphosphonates, IV Zometa every 6 months.  She is agreeable to this plan.  I connected with  Kelli Vaughn on 05/16/23 by a telephone application and verified that I am speaking with the correct person using two identifiers.   I discussed the limitations of evaluation and management by telemedicine. The patient expressed understanding and agreed to proceed.  Total time spent: 10 min

## 2023-05-16 NOTE — Assessment & Plan Note (Addendum)
This is a very pleasant 67 year old female patient with past medical history significant for type 2 diabetes mellitus, hypertension who had an abnormal mammogram back in 2021 with 6 mm breast mass in the left breast at 930 o'clock as well as left abnormal axillary lymph nodes, did not undergo biopsy at that time as recommended who presented with abnormal mammogram again which led to further testing.  Left breast needle core biopsy at this time showed invasive ductal carcinoma, grade 2 along with DCIS, ER +80% strong intensity, PR +100% strong intensity, KI 5% and HER2 negative.  Lymph node biopsy was negative which was thought to be discordant.    She was presented in the breast [facility] and recommendation was to proceed with surgery upfront followed by adjuvant recommendations based on final pathology.  Final path showed left invasive ductal carcinoma, 13 mm in greatest dimension, Nottingham grade 2, DCIS grade 2, all margins negative, 1 out of 5 axillary lymph nodes with micrometastasis, tumor measuring 1.3 mm. Progs were repeated and issued as an addendum. This is ER moderate- weak staining 80%, PR 100%, Her 2 neg.  Oncotype DX showed a score of 11, there appears to be no benefit from chemotherapy.   Distant recurrence risk at 9 years of 13% with antiestrogen alone.   She is now s/p adjuvant radiation and is on anastrozole.  This was a follow-up telephone visit to review her bone density results.  I have discussed that she has osteoporosis and will need treatment especially since she is also on anastrozole.  We have discussed about role of bisphosphonate and management of osteoporosis.  She did see a dentist in [place], provided her phone number as well as name of the clinic.  Will try to fax dental clearance to them.  Once we obtain dental clearance, we will start her on bisphosphonates, IV Zometa every 6 months.  She is agreeable to this plan.

## 2023-05-24 ENCOUNTER — Other Ambulatory Visit: Payer: Self-pay | Admitting: Hematology and Oncology

## 2023-05-24 NOTE — Progress Notes (Signed)
Dental clearance received Will put plan in zometa every 24 weeks.  Kelli Vaughn

## 2023-05-26 ENCOUNTER — Other Ambulatory Visit: Payer: Self-pay | Admitting: Pharmacist

## 2023-05-26 ENCOUNTER — Other Ambulatory Visit: Payer: Self-pay | Admitting: Hematology and Oncology

## 2023-05-26 ENCOUNTER — Telehealth: Payer: Self-pay | Admitting: *Deleted

## 2023-05-26 NOTE — Telephone Encounter (Signed)
Received dental clearance from Dr Frankey Poot dds with recommendation " pt can proceed with treatment" for bisphosphonate therapy.

## 2023-05-26 NOTE — Progress Notes (Signed)
Zometa orders signed.

## 2023-05-27 ENCOUNTER — Telehealth: Payer: Self-pay | Admitting: Hematology and Oncology

## 2023-05-27 NOTE — Telephone Encounter (Signed)
 Left patient a vm regarding upcoming appointment

## 2023-05-31 ENCOUNTER — Other Ambulatory Visit: Payer: Self-pay | Admitting: Hematology and Oncology

## 2023-06-01 ENCOUNTER — Other Ambulatory Visit: Payer: Self-pay | Admitting: *Deleted

## 2023-06-01 ENCOUNTER — Inpatient Hospital Stay: Payer: No Typology Code available for payment source

## 2023-06-01 VITALS — BP 155/62 | HR 94 | Temp 98.3°F | Resp 16

## 2023-06-01 DIAGNOSIS — C50212 Malignant neoplasm of upper-inner quadrant of left female breast: Secondary | ICD-10-CM

## 2023-06-01 LAB — CMP (CANCER CENTER ONLY)
ALT: 24 U/L (ref 0–44)
AST: 15 U/L (ref 15–41)
Albumin: 4.2 g/dL (ref 3.5–5.0)
Alkaline Phosphatase: 131 U/L — ABNORMAL HIGH (ref 38–126)
Anion gap: 7 (ref 5–15)
BUN: 15 mg/dL (ref 8–23)
CO2: 26 mmol/L (ref 22–32)
Calcium: 10.6 mg/dL — ABNORMAL HIGH (ref 8.9–10.3)
Chloride: 102 mmol/L (ref 98–111)
Creatinine: 0.72 mg/dL (ref 0.44–1.00)
GFR, Estimated: >60 mL/min (ref >60)
Glucose, Bld: 330 mg/dL — ABNORMAL HIGH (ref 70–99)
Potassium: 4.3 mmol/L (ref 3.5–5.1)
Sodium: 135 mmol/L (ref 135–145)
Total Bilirubin: 0.2 mg/dL (ref <1.2)
Total Protein: 7.3 g/dL (ref 6.5–8.1)

## 2023-06-01 LAB — CBC WITH DIFFERENTIAL (CANCER CENTER ONLY)
Abs Immature Granulocytes: 0.06 10*3/uL (ref 0.00–0.07)
Basophils Absolute: 0.1 10*3/uL (ref 0.0–0.1)
Basophils Relative: 1 %
Eosinophils Absolute: 0.3 10*3/uL (ref 0.0–0.5)
Eosinophils Relative: 2 %
HCT: 43 % (ref 36.0–46.0)
Hemoglobin: 14.2 g/dL (ref 12.0–15.0)
Immature Granulocytes: 1 %
Lymphocytes Relative: 23 %
Lymphs Abs: 2.4 10*3/uL (ref 0.7–4.0)
MCH: 29.8 pg (ref 26.0–34.0)
MCHC: 33 g/dL (ref 30.0–36.0)
MCV: 90.1 fL (ref 80.0–100.0)
Monocytes Absolute: 0.7 10*3/uL (ref 0.1–1.0)
Monocytes Relative: 7 %
Neutro Abs: 6.9 10*3/uL (ref 1.7–7.7)
Neutrophils Relative %: 66 %
Platelet Count: 327 10*3/uL (ref 150–400)
RBC: 4.77 MIL/uL (ref 3.87–5.11)
RDW: 12.6 % (ref 11.5–15.5)
WBC Count: 10.4 10*3/uL (ref 4.0–10.5)
nRBC: 0 % (ref 0.0–0.2)

## 2023-06-01 MED ORDER — SODIUM CHLORIDE 0.9% FLUSH
10.0000 mL | Freq: Two times a day (BID) | INTRAVENOUS | Status: DC
Start: 2023-06-01 — End: 2023-06-01

## 2023-06-01 MED ORDER — SODIUM CHLORIDE 0.9 % IV SOLN: INTRAVENOUS | Status: DC

## 2023-06-01 MED ORDER — PROCHLORPERAZINE MALEATE 5 MG PO TABS: 5.0000 mg | ORAL_TABLET | Freq: Three times a day (TID) | ORAL | 0 refills | Status: DC | PRN

## 2023-06-01 MED ORDER — ZOLEDRONIC ACID 4 MG/100ML IV SOLN
4.0000 mg | Freq: Once | INTRAVENOUS | Status: AC
  Administered 2023-06-01: 4 mg via INTRAVENOUS
  Filled 2023-06-01: qty 100

## 2023-06-01 NOTE — Patient Instructions (Signed)
Zoledronic Acid Injection (Cancer) Qu es este medicamento? El CIDO ZOLEDRNICO trata los niveles altos de calcio en la sangre causados por Management consultant. Tambin podra usarse junto con la quimioterapia para tratar los [employer] debilitados a causa del Database administrator. Acta desacelerando la liberacin de calcio de los [place]. Esto reduce los niveles de [employer]. Tambin fortalece los huesos y hace que sea menos probable que se rompan (fracturen). Pertenece a un grupo de medicamentos llamados bifosfonatos. Este medicamento puede ser utilizado para otros usos; si tiene alguna pregunta consulte con su proveedor de atencin mdica o con su farmacutico. MARCAS COMUNES: Zometa, Zometa Powder Qu le debo informar a mi profesional de la salud antes de tomar este medicamento? Necesitan saber si usted presenta alguno de los siguientes problemas o situaciones: Deshidratacin Enfermedad dental Enfermedad renal Enfermedad heptica Niveles bajos de calcio en la sangre Enfermedad pulmonar o respiratoria, como asma Recibe medicamentos esteroideos, tales como dexametasona o prednisona Una reaccin alrgica o inusual al cido zoledrnico, a otros medicamentos, alimentos, colorantes o conservantes Si est embarazada o buscando quedar embarazada Si est amamantando a un beb Cmo debo utilizar este medicamento? Este medicamento se inyecta en una vena. Su equipo de atencin lo [employer] en un hospital o en un entorno clnico. Hable con su equipo de atencin sobre el uso de este medicamento en nios. Puede requerir atencin especial. Sobredosis: Pngase en contacto inmediatamente con un centro toxicolgico o una sala de urgencia si usted cree que haya tomado demasiado medicamento.<br>ATENCIN: [employer] es solo para usted. No comparta este medicamento con nadie. Qu sucede si me olvido de una dosis? Cumpla con las citas para dosis de seguimiento. Es importante no olvidar ninguna dosis. Llame a su equipo  de atencin si no puede asistir a una cita. Qu puede interactuar con este medicamento? Ciertos antibiticos inyectables Diurticos, tales como bumetanida, furosemida AINE, medicamentos para Chief Technology Officer y Futures trader, tales como ibuprofeno o naproxeno Teriparatida Talidomida Puede ser que esta lista no menciona todas las posibles interacciones. Informe a su profesional de [employer] de [employer] Inc productos a base de hierbas, medicamentos de [place] o suplementos nutritivos que est tomando. Si usted fuma, consume bebidas alcohlicas o si utiliza drogas ilegales, indqueselo tambin a su profesional de [employer]. Algunas sustancias pueden interactuar con su medicamento. A qu debo estar atento al usar [employer]PL Corporation? Visite a su equipo de atencin para que revise su evolucin peridicamente. Es posible que pase un tiempo antes de que pueda notar los beneficios de [place]. Algunas personas que toman este medicamento tienen dolor intenso en huesos, articulaciones o msculos. Este medicamento tambin puede aumentar su riesgo de problemas en la mandbula o de tener una fractura de fmur. Informe a su equipo de atencin de inmediato si tiene dolor intenso en la [place], los [place], las articulaciones o los msculos. Informe a su equipo de atencin si tiene dolor que no desaparece o que empeora. Informe a su dentista y a su Airline pilot que est usando este medicamento. No deben realizarle [place] dental mayor mientras [place] este medicamento. Antes de comenzar a Producer, television/film/video, visite a su dentista para Physicist, medical un examen y arregle TEFL teacher. Cuide bien sus dientes mientras [place] este medicamento. Asegrese de visitar a su dentista para citas de seguimiento peridicas. Debe asegurarse de recibir suficiente calcio y vitamina D mientras [place] este medicamento. Converse con su equipo de atencin [employer]Star que come y las vitaminas que toma. Consulte con  su  equipo de atencin si tiene diarrea grave, nuseas y vmitos, o sudoracin intensa. La prdida de demasiado lquido corporal podra hacer que sea peligroso usar [employer] Corporation. Usted podra necesitar realizarse [employer]AM[employer] Corporation de sangre mientras est usando [place]. Informe a su equipo de atencin si est buscando un embarazo o si cree que podra estar en embarazo. Este medicamento puede causar defectos congnitos graves. Qu efectos secundarios puedo tener al [employer] este medicamento? Efectos secundarios que debe informar a su equipo de atencin tan pronto como sea posible: Reacciones alrgicas: erupcin cutnea, comezn/picazn, urticaria, hinchazn de la cara, los labios, la lengua o la garganta Lesin en los riones: disminucin en la cantidad de orina, hinchazn de los tobillos, las manos o los pies Nivel bajo de calcio: Engineer, mining o calambres musculares, confusin, hormigueo o entumecimiento en manos o pies Osteonecrosis de la mandbula: dolor, hinchazn o enrojecimiento en la boca, entumecimiento de la [place], mala cicatrizacin despus de un trabajo odontolgico, secrecin inusual de la boca, huesos visibles en la boca Dolor intenso en los [place], las articulaciones o los msculos Efectos secundarios que generalmente no requieren atencin mdica (debe informarlos a su equipo de atencin si persisten o si son molestos): Estreimiento Restaurant manager, fast food Prdida del apetito Nuseas Dolor estomacal Puede ser que esta lista no menciona todos los posibles efectos secundarios. Comunquese a su mdico por asesoramiento mdico [employer]lett[employer]ard. Usted puede informar los efectos secundarios a la FDA por telfono al 1-800-FDA-1088. Dnde debo guardar mi medicina? Este medicamento se administra en hospitales o clnicas. No se guarda en su casa. <b>ATENCIN: Este folleto es un resumen. Puede ser que no cubra toda la posible informacin. Si usted tiene preguntas acerca de esta medicina,  consulte con su mdico, su farmacutico o su profesional de Radiographer, therapeutic.</b>  2024 Elsevier/Gold Standard (2022-08-02 00:00:00)

## 2023-06-05 ENCOUNTER — Telehealth: Payer: Self-pay | Admitting: *Deleted

## 2023-06-05 ENCOUNTER — Inpatient Hospital Stay: Payer: Self-pay | Attending: Hematology and Oncology

## 2023-06-05 ENCOUNTER — Inpatient Hospital Stay (HOSPITAL_BASED_OUTPATIENT_CLINIC_OR_DEPARTMENT_OTHER): Payer: No Typology Code available for payment source | Admitting: Physician Assistant

## 2023-06-05 ENCOUNTER — Ambulatory Visit: Payer: No Typology Code available for payment source

## 2023-06-05 VITALS — BP 158/71 | HR 86 | Temp 97.7°F | Resp 16 | Wt 186.7 lb

## 2023-06-05 DIAGNOSIS — H209 Unspecified iridocyclitis: Secondary | ICD-10-CM | POA: Insufficient documentation

## 2023-06-05 DIAGNOSIS — C50212 Malignant neoplasm of upper-inner quadrant of left female breast: Secondary | ICD-10-CM

## 2023-06-05 DIAGNOSIS — Z79811 Long term (current) use of aromatase inhibitors: Secondary | ICD-10-CM | POA: Insufficient documentation

## 2023-06-05 DIAGNOSIS — Z803 Family history of malignant neoplasm of breast: Secondary | ICD-10-CM | POA: Insufficient documentation

## 2023-06-05 DIAGNOSIS — Z170 Estrogen receptor positive status [ER+]: Secondary | ICD-10-CM | POA: Insufficient documentation

## 2023-06-05 DIAGNOSIS — R519 Headache, unspecified: Secondary | ICD-10-CM | POA: Insufficient documentation

## 2023-06-05 DIAGNOSIS — Z87891 Personal history of nicotine dependence: Secondary | ICD-10-CM | POA: Insufficient documentation

## 2023-06-05 DIAGNOSIS — Z923 Personal history of irradiation: Secondary | ICD-10-CM | POA: Insufficient documentation

## 2023-06-05 DIAGNOSIS — E119 Type 2 diabetes mellitus without complications: Secondary | ICD-10-CM | POA: Insufficient documentation

## 2023-06-05 DIAGNOSIS — Z800 Family history of malignant neoplasm of digestive organs: Secondary | ICD-10-CM | POA: Insufficient documentation

## 2023-06-05 MED ORDER — KETOROLAC TROMETHAMINE 60 MG/2ML IM SOLN
15.0000 mg | Freq: Once | INTRAMUSCULAR | Status: DC
  Filled 2023-06-05: qty 2

## 2023-06-05 MED ORDER — KETOROLAC TROMETHAMINE 30 MG/ML IJ SOLN
15.0000 mg | Freq: Once | INTRAMUSCULAR | Status: AC
  Administered 2023-06-05: 15 mg via INTRAMUSCULAR
  Filled 2023-06-05: qty 1

## 2023-06-05 NOTE — Telephone Encounter (Signed)
Patient's son called concerned that since [name] received Zometa on Thursday she has been waking in the morning with a swollen face. This morning her face is swollen with her left eye swollen shut. Son reports Saturday her eyes were also both very red and blood shot. This resolved by the end of the day. The swelling goes down through out the day however it does return and seems to get worse each day.   Patient scheduled for [facility] appt today at 10am. Son aware and will transport her to appointment.

## 2023-06-05 NOTE — Progress Notes (Signed)
Symptom Management Consult Note [facility]    Patient Care Team: Selinda Flavin, MD as PCP - General (Family Medicine) Almond Lint, MD as Consulting Physician (General Surgery) Rachel Moulds, MD as Consulting Physician (Hematology and Oncology) Dorothy Puffer, MD as Consulting Physician (Radiation Oncology) Axel Filler, Larna Daughters, NP as Nurse Practitioner (Hematology and Oncology) Maryclare Labrador, RN as Registered Nurse    Name / MRN / DOB: Kelli Vaughn  [id number]  1956/03/19   Date of visit: 06/05/2023   Chief Complaint/Reason for visit: facial swelling   Current Therapy: Anastrozole and Zometa  Last treatment:  Treatment 1 on 06/01/23   ASSESSMENT & PLAN: Patient is a 67 y.o. female with oncologic history of malignant neoplasm of upper-inner quadrant of left breast in female, estrogen receptor positivefollowed by Dr. Al Pimple.  I have viewed most recent oncology note and lab work.    #Malignant neoplasm of upper-inner quadrant of left breast in female, estrogen receptor positive  - S/p adjuvant radiation and currently taking anastrozole - Plan per oncology office visit on 05/16/23 was to start IV Zometa q6 months - Next appointment with oncologist is 12/02/23   #Uveitis -Limited exam today is concerning for uveitis. This a rare complication of Zometa however has been reported with bisphosphonates.   -Contacted on call ophthalmologist who can see patient this afternoon. Appreciate assistance in this patient's care  #Headache -Acute, worsened since onset.  Exam without focal weakness. No temporal tenderness. - Patient received 15 mg IM toradol in clinic for pain. Chart review shows history of normal kidney function. -Patient will continue tylenol at home for headaches,.    -Patient received IM Toradol in clinic for headache. Chart review shows history of normal renal function     Strict ED precautions discussed should symptoms worsen.   Heme/Onc  History: Oncology History Overview Note  Patient was called back for evaluation of LEFT breast calcifications, focal asymmetry, and possible enlarged LEFT axillary lymph node from screening mammogram at [facility] [place] on 05/20/2021. 08/12/2021, Suspicious mass in the 9:30 o'clock location of the LEFT breast warranting tissue diagnosis. Two LEFT axillary lymph nodes with abnormal morphology. 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 Lymph node biopsy of left axilla negative, lymph node biopsy results were thought to be discordant.   Patient does recollect being asked to schedule biopsy back in 2021 for abnormal mammogram but she was worried or had this misinformation that biopsy can let the breast cancer spread to other parts hence she decided not to proceed with biopsy at that time. She is here with her 2 sons, speaks in [place], Spanish interpreter present at the time of my visit.  Son also help with the translation.   Malignant neoplasm of upper-inner quadrant of left breast in female, estrogen receptor positive (HCC)  08/31/2021 Initial Diagnosis   Malignant neoplasm of upper-inner quadrant of left breast in female, estrogen receptor positive (HCC)   09/01/2021 Cancer Staging   Staging form: Breast, AJCC 8th Edition - Clinical stage from 09/01/2021: Stage Unknown (cT1b, cNX, cM0, G2, ER+, PR+, HER2-) - Signed by Rachel Moulds, MD on 09/01/2021 Stage prefix: Initial diagnosis Histologic grading system: 3 grade system    Genetic Testing   [facility] CancerNext Panel was Negative. 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 Report date is 09/20/2021.  The CancerNext  gene panel offered by [employer] includes sequencing, rearrangement analysis, and RNA analysis for the following 36 genes:   APC, ATM, AXIN2, BARD1, BMPR1A, BRCA1, BRCA2, BRIP1,  CDH1, CDK4, CDKN2A, CHEK2, DICER1, HOXB13, EPCAM, GREM1, MLH1, MSH2, MSH3, MSH6, MUTYH, NBN, NF1, NTHL1, PALB2, PMS2, POLD1, POLE, PTEN, RAD51C, RAD51D, RECQL, SMAD4, SMARCA4, STK11, and TP53.    09/21/2021 Surgery   Patient had left breast lumpectomy with a sentinel lymph node excision on 09/21/2021.  Final pathology showed invasive carcinoma of no special type, 13 mm in greatest dimension, grade 2, DCIS grade 2, all margins negative for invasive carcinoma and DCIS, distance from invasive carcinoma to closest margin is 0.5 mm, posterior margin and distance from DCIS to closest margin, 1 mm posterior margin.  5 axillary lymph nodes removed.  1 axillary lymph node with micrometastasis, metastatic deposit of 0.3 mm in greatest dimension.   Prognostics ER +80% moderate weak staining intensity, PR +100% strong staining intensity and HER2 negative    Oncotype testing   Oncotype DX score of 11.  Distant recurrence risk at 9 years of 13% with antiestrogen therapy.  No benefit from chemotherapy   11/11/2021 - 12/09/2021 Radiation Therapy    Treatment Dates: 11/11/2021 through 12/09/2021 Site Technique Total Dose (Gy) Dose per Fx (Gy) Completed Fx Beam Energies  Breast, Left: Breast_L 3D 42.56/42.56 2.66 16/16 10X  Breast, Left: Breast_L_Bst 3D 8/8 2 4/4 6X, 10X      12/2021 -  Anti-estrogen oral therapy   Anastrozole       Interval history-: Discussed the use of AI scribe software for clinical note transcription with the patient, who gave verbal consent to proceed.   Kelli Vaughn is a 67 y.o. female with oncologic history as above presenting to [facility] today with chief complaint of left eye pain.  Patient is accompanied by family member who provides additional history. Patient prefers son to help translate.  Patient reports a headache and left eye pain that began after her first Zometa infusion which was x 4 days ago. The headache, described as very painful, started gradually and progressively  worsened. Two days post-infusion, the patient noticed bloodshot eyes upon waking, with the left eye appearing swollen. The swelling persisted throughout the day and was accompanied by pain, particularly when touched. The eye started tearing today, she otherwise denies any discharge.  Despite the swelling, the patient confirmed she could still see out of the affected eye, although it was painful to keep it open. The patient denied any recent injuries to the eye.  The patient has a history of cataract surgery in 2019 and uses glasses for reading. She denied any other eye problems. The patient attempted to manage the pain and swelling with Tylenol and a natural supplement for inflammation, which she had used previously for other conditions. She also applied a cold compress and used an over-the-counter saline wash for the eye. These interventions provided some relief, but the pain and swelling persisted.  The patient's symptoms, particularly the severe headache and eye pain, have caused significant distress. The pain is rated 10/10.  ROS  All other systems are reviewed and are negative for acute change except as noted in the HPI.    Allergies  Allergen Reactions   Insulin Itching    Pt does not know the name of the insulin but recalls having this reaction.     Past Medical History:  Diagnosis Date   Breast cancer (HCC)    left breast   Complication  of anesthesia    Diabetes mellitus without complication (HCC)    TYPE II   Glaucoma    Hypertension    Personal history of radiation therapy    PONV (postoperative nausea and vomiting)      Past Surgical History:  Procedure Laterality Date   BREAST LUMPECTOMY WITH RADIOACTIVE SEED AND SENTINEL LYMPH NODE BIOPSY Left 09/21/2021   Procedure: LEFT BREAST LUMPECTOMY WITH RADIOACTIVE SEED X2 AND SENTINEL LYMPH NODE BIOPSY;  Surgeon: Almond Lint, MD;  Location: [facility] OR;  Service: General;  Laterality: Left;   CESAREAN SECTION     X2   EYE SURGERY  Bilateral 2019   cataract surgery   RADIOACTIVE SEED GUIDED AXILLARY SENTINEL LYMPH NODE Left 09/21/2021   Procedure: RADIOACTIVE SEED GUIDED AXILLARY SENTINEL LYMPH NODE BIOPSY;  Surgeon: Almond Lint, MD;  Location: [facility] OR;  Service: General;  Laterality: Left;    Social History   Socioeconomic History   Marital status: Divorced    Spouse name: Not on file   Number of children: 2   Years of education: Not on file   Highest education level: Some college, no degree  Occupational History   Not on file  Tobacco Use   Smoking status: Former    Types: Cigarettes   Smokeless tobacco: Never  Vaping Use   Vaping status: Never Used  Substance and Sexual Activity   Alcohol use: No   Drug use: No   Sexual activity: Not Currently    Birth control/protection: Post-menopausal  Other Topics Concern   Not on file  Social History Narrative   Not on file   Social Determinants of Health   Financial Resource Strain: Not on file  Food Insecurity: No Food Insecurity (03/28/2023)   Hunger Vital Sign    Worried About Running Out of Food in the Last Year: Never true    Ran Out of Food in the Last Year: Never true  Transportation Needs: No Transportation Needs (03/28/2023)   PRAPARE - Administrator, Civil Service (Medical): No    Lack of Transportation (Non-Medical): No  Physical Activity: Not on file  Stress: Not on file  Social Connections: Not on file  Intimate Partner Violence: Not on file    Family History  Problem Relation Age of Onset   Heart disease Mother    Bone cancer Father    Breast cancer Sister        dx. 30s   Stomach cancer Sister        dx. 30s, unsure if second primary or metastasis   Breast cancer Niece      Current Outpatient Medications:    anastrozole (ARIMIDEX) 1 MG tablet, Take 1 tablet (1 mg total) by mouth daily., Disp: 90 tablet, Rfl: 3   aspirin EC 81 MG tablet, Take 81 mg by mouth at bedtime., Disp: , Rfl:    cholecalciferol (VITAMIN D3)  25 MCG (1000 UNIT) tablet, Take 1,000 Units by mouth daily. (Patient not taking: Reported on 03/28/2023), Disp: , Rfl:    gabapentin (NEURONTIN) 300 MG capsule, Take 300 mg by mouth daily at 12 noon. (Patient not taking: Reported on 03/28/2023), Disp: , Rfl:    glipiZIDE (GLUCOTROL) 10 MG tablet, Take 10 mg by mouth daily before breakfast., Disp: , Rfl:    hydrochlorothiazide (HYDRODIURIL) 25 MG tablet, Take 25 mg by mouth in the morning., Disp: , Rfl:    HYDROcodone-acetaminophen (NORCO/VICODIN) 5-325 MG tablet, Take 1 tablet by mouth every 6 (six) hours as  needed. (Patient not taking: Reported on 03/28/2023), Disp: 14 tablet, Rfl: 0   ibuprofen (ADVIL,MOTRIN) 800 MG tablet, Take 1,600 mg by mouth every 8 (eight) hours as needed for moderate pain., Disp: , Rfl:    insulin glargine (LANTUS) 100 UNIT/ML injection, Inject 35 Units into the skin 2 (two) times daily., Disp: , Rfl:    latanoprost (XALATAN) 0.005 % ophthalmic solution, 1 drop every morning., Disp: , Rfl:    lisinopril (ZESTRIL) 10 MG tablet, Take 10 mg by mouth in the morning., Disp: , Rfl:    omeprazole (PRILOSEC) 40 MG capsule, Take by mouth., Disp: , Rfl:    prochlorperazine (COMPAZINE) 5 MG tablet, Take 1 tablet (5 mg total) by mouth every 8 (eight) hours as needed for nausea or vomiting., Disp: 20 tablet, Rfl: 0   SitaGLIPtin-MetFORMIN HCl (JANUMET XR) 50-1000 MG TB24, Take 1 tablet by mouth in the morning and at bedtime., Disp: , Rfl:    Vitamin D, Ergocalciferol, (DRISDOL) 1.25 MG (50000 UNIT) CAPS capsule, Take 1 capsule (50,000 Units total) by mouth every 7 (seven) days. (Patient not taking: Reported on 08/16/2022), Disp: 12 capsule, Rfl: 0  PHYSICAL EXAM: ECOG FS:1 - Symptomatic but completely ambulatory    Vitals:   06/05/23 1109  BP: (!) 158/71  Pulse: 86  Resp: 16  Temp: 97.7 F (36.5 C)  TempSrc: Temporal  SpO2: 97%  Weight: 186 lb 11.2 oz (84.7 kg)   Physical Exam Vitals and nursing note reviewed.  Constitutional:       Appearance: She is not ill-appearing or toxic-appearing.  HENT:     Head: Normocephalic.     Comments: Please see media below Eyes:     General: Lids are normal. Lids are everted, no foreign bodies appreciated.        Left eye: Discharge (tearing) present.No foreign body.     Conjunctiva/sclera:     Left eye: Left conjunctiva is injected. Chemosis present.     Comments: No temporal tenderness.  Pain with left eye EOM   Cardiovascular:     Rate and Rhythm: Normal rate and regular rhythm.     Pulses: Normal pulses.     Heart sounds: Normal heart sounds.  Pulmonary:     Effort: Pulmonary effort is normal.     Breath sounds: Normal breath sounds.  Abdominal:     General: There is no distension.  Musculoskeletal:     Cervical back: Normal range of motion.  Skin:    General: Skin is warm and dry.  Neurological:     Mental Status: She is alert.     Comments: Speech is clear and goal oriented, follows commands No facial droop Normal strength in upper and lower extremities bilaterally including dorsiflexion and plantar flexion, strong and equal grip strength Sensation normal to light and sharp touch Moves extremities without ataxia, coordination intact Normal gait and balance          LABORATORY DATA: I have reviewed the data as listed    Latest Ref Rng & Units 06/01/2023    1:33 PM 09/09/2022    8:50 AM 03/11/2022   10:36 AM  CBC  WBC 4.0 - 10.5 K/uL 10.4  9.2  8.6   Hemoglobin 12.0 - 15.0 g/dL 72.5  36.6  44.0   Hematocrit 36.0 - 46.0 % 43.0  40.7  40.7   Platelets 150 - 400 K/uL 327  304  283         Latest Ref Rng & Units 06/01/2023  1:33 PM 09/09/2022    8:50 AM 03/11/2022   10:36 AM  CMP  Glucose 70 - 99 mg/dL 829  562  130   BUN 8 - 23 mg/dL 15  20  15    Creatinine 0.44 - 1.00 mg/dL 8.65  7.84  6.96   Sodium 135 - 145 mmol/L 135  137  137   Potassium 3.5 - 5.1 mmol/L 4.3  4.1  4.1   Chloride 98 - 111 mmol/L 102  103  103   CO2 22 - 32 mmol/L 26  27  30     Calcium 8.9 - 10.3 mg/dL 29.5  28.4  13.2   Total Protein 6.5 - 8.1 g/dL 7.3  7.3  7.1   Total Bilirubin <1.2 mg/dL 0.2  0.4  0.3   Alkaline Phos 38 - 126 U/L 131  99  101   AST 15 - 41 U/L 15  23  17    ALT 0 - 44 U/L 24  36  30        RADIOGRAPHIC STUDIES (from last 24 hours if applicable) I have personally reviewed the radiological images as listed and agreed with the findings in the report. No results found.      Visit Diagnosis: 1. Malignant neoplasm of upper-inner quadrant of left breast in female, estrogen receptor positive (HCC)   2. Acute intractable headache, unspecified headache type   3. Uveitis      Orders Placed This Encounter  Procedures   Ambulatory referral to Ophthalmology    Referral Priority:   Routine    Referral Type:   Consultation    Referral Reason:   Specialty Services Required    Referred to Provider:   Shon Millet, MD    Requested Specialty:   Ophthalmology    Number of Visits Requested:   1    All questions were answered. The patient knows to call the clinic with any problems, questions or concerns. No barriers to learning was detected.  A total of more than 30 minutes were spent on this encounter with face-to-face time and non-face-to-face time, including preparing to see the patient, ordering  medications, counseling the patient and coordination of care as outlined above.    Thank you for allowing me to participate in the care of this patient.    Shanon Ace, PA-C Department of Hematology/Oncology [facility] at [facility] Phone: [phone]  [phone]    06/05/2023 12:26 PM

## 2023-06-05 NOTE — Patient Instructions (Addendum)
You have an appointment with Dr. Sherrine Maples at [facility] at 1 pm today.  The address is : [address] [place] [address]  The office phone number is [phone]

## 2023-07-14 ENCOUNTER — Telehealth: Payer: Self-pay | Admitting: *Deleted

## 2023-07-14 NOTE — Telephone Encounter (Signed)
 Spoke with Dr Gordy Councilman office. They saw her for an initial consult. Will fax [place] the notes. Pt did not return for follow up with Dr Sherrine Maples

## 2023-08-01 ENCOUNTER — Telehealth: Payer: Self-pay

## 2023-08-01 ENCOUNTER — Other Ambulatory Visit: Payer: Self-pay

## 2023-08-01 DIAGNOSIS — C50212 Malignant neoplasm of upper-inner quadrant of left female breast: Secondary | ICD-10-CM

## 2023-08-01 DIAGNOSIS — Z1231 Encounter for screening mammogram for malignant neoplasm of breast: Secondary | ICD-10-CM

## 2023-08-01 NOTE — Telephone Encounter (Signed)
Telephoned patient at mobile number using interpreter, Ladell Pier. Left a voice message with BCCCP contact information.

## 2023-09-12 ENCOUNTER — Other Ambulatory Visit: Payer: Self-pay | Admitting: Hematology and Oncology

## 2023-09-12 DIAGNOSIS — C50212 Malignant neoplasm of upper-inner quadrant of left female breast: Secondary | ICD-10-CM

## 2023-09-14 ENCOUNTER — Ambulatory Visit: Admission: RE | Admit: 2023-09-14 | Payer: No Typology Code available for payment source | Source: Ambulatory Visit

## 2023-09-14 ENCOUNTER — Ambulatory Visit: Payer: Self-pay | Admitting: *Deleted

## 2023-09-14 ENCOUNTER — Ambulatory Visit
Admission: RE | Admit: 2023-09-14 | Discharge: 2023-09-14 | Disposition: A | Discharge status: Home / Self Care | Payer: No Typology Code available for payment source | Source: Ambulatory Visit | Attending: Obstetrics and Gynecology | Admitting: Obstetrics and Gynecology

## 2023-09-14 VITALS — Wt 177.0 lb

## 2023-09-14 DIAGNOSIS — Z1239 Encounter for other screening for malignant neoplasm of breast: Secondary | ICD-10-CM

## 2023-09-14 DIAGNOSIS — Z1231 Encounter for screening mammogram for malignant neoplasm of breast: Secondary | ICD-10-CM

## 2023-09-14 DIAGNOSIS — Z170 Estrogen receptor positive status [ER+]: Secondary | ICD-10-CM

## 2023-09-14 NOTE — Patient Instructions (Signed)
 Explained breast self awareness with Cathe Mons. Patient did not need a Pap smear today due to last Pap smear and HPV typing was 09/08/2021. Let her know BCCCP will cover Pap smears and HPV typing every 5 years unless has a history of abnormal Pap smears. Referred patient to the [facility] of [facility] for a diagnostic mammogram. Appointment scheduled Thursday, September 14, 2023 at 1240. Patient aware of appointment and will be there. Kelli Vaughn verbalized understanding.  Laniesha Das, Kathaleen Maser, RN 12:10 PM

## 2023-09-14 NOTE — Progress Notes (Signed)
 [name] is a 68 y.o. female who presents to [facility] clinic today with no complaints. Patient has a history of left breast cancer that a bilateral diagnostic mammogram was recommended in one year. Her last diagnostic mammogram was completed 08/16/2022.   Pap Smear: Pap smear not completed today. Last Pap smear was 09/08/2021 at the free cervical cancer screening clinic and was normal with negative HPV. Per patient has no history of an abnormal Pap smear. Last Pap smear result is available in Epic.    Physical exam: Breasts Right breast slightly larger than left due to history of lumpectomy due to breast cancer. Observed scar left inner breast from history of lumpectomy for breast cancer. Skin darkened left lower breast due to history of radiation treatment for breast cancer. No skin abnormalities right breast. No nipple retraction bilateral breasts. No nipple discharge bilateral breasts. No lymphadenopathy. No lumps palpated bilateral breasts. Complaints of left breast diffuse tenderness on exam.  MS 3D DIAG MAMMO UNI LT BR (aka MM) Result Date: 03/30/2023 CLINICAL DATA:  68 year old female presenting for evaluation of diffuse left breast pain radiating into the left axilla. Patient has a history of left breast cancer status post lumpectomy in March 2023. No associated lump for skin changes. EXAM: DIGITAL DIAGNOSTIC UNILATERAL LEFT MAMMOGRAM WITH TOMOSYNTHESIS AND CAD TECHNIQUE: Left digital diagnostic mammography and breast tomosynthesis was performed. The images were evaluated with computer-aided detection. COMPARISON:  Previous exam(s). ACR Breast Density Category b: There are scattered areas of fibroglandular density. FINDINGS: There are stable postsurgical changes at the lumpectomy site. No suspicious mass, distortion, or microcalcifications are identified to suggest presence of malignancy. IMPRESSION: Stable postsurgical changes in the left breast. No mammographic evidence of  malignancy. RECOMMENDATION: 1. Clinical follow-up as needed for left breast pain which is likely postsurgical/post treatment in nature. 2.  Diagnostic bilateral mammogram in February 2025. I have discussed the findings and recommendations with the patient. If applicable, a reminder letter will be sent to the patient regarding the next appointment. BI-RADS CATEGORY  2: Benign. Electronically Signed   By: Emmaline Kluver M.D.   On: 03/30/2023 10:36   MS DIGITAL DIAG TOMO BILAT Result Date: 08/16/2022 CLINICAL DATA:  68 year old female for annual follow-up. History of LEFT breast cancer and lumpectomy in 2023. EXAM: DIGITAL DIAGNOSTIC BILATERAL MAMMOGRAM WITH TOMOSYNTHESIS TECHNIQUE: Bilateral digital diagnostic mammography and breast tomosynthesis was performed. COMPARISON:  Previous exam(s). ACR Breast Density Category b: There are scattered areas of fibroglandular density. FINDINGS: 2D and 3D full field views of both breasts and a magnification view of the lumpectomy site demonstrate no suspicious mass, nonsurgical distortion or worrisome calcifications. Surgical changes in the LEFT breast and LEFT axilla are noted. IMPRESSION: No evidence of breast malignancy. RECOMMENDATION: Bilateral diagnostic mammogram in 1 year. I have discussed the findings and recommendations with the patient. If applicable, a reminder letter will be sent to the patient regarding the next appointment. BI-RADS CATEGORY  2: Benign. Electronically Signed   By: Harmon Pier M.D.   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 Radioactive seed localization was performed yesterday in anticipation of today's lumpectomy and targeted node dissection. EXAM: SPECIMEN RADIOGRAPHS OF THE LEFT BREAST and LEFT AXILLA COMPARISON:   Previous exam(s). FINDINGS: Status post excision of the LEFT breast and status post targeted  LEFT axillary node dissection. The radioactive seeds and the ribbon shaped biopsy marker clip are present in the breast specimen, are completely intact, and are marked for pathology. The Tribell clip and the radioactive seed are present in the axillary node specimen, are completely intact, and are marked pathology. IMPRESSION: Specimen radiographs of the LEFT breast and LEFT axilla. Electronically Signed   By: Hulan Saas M.D.   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 Radioactive seed localization was performed yesterday in anticipation of today's lumpectomy and targeted node dissection. EXAM: SPECIMEN RADIOGRAPHS OF THE LEFT BREAST and LEFT AXILLA COMPARISON:  Previous exam(s). FINDINGS: Status post excision of the LEFT breast and status post targeted LEFT axillary node dissection. The radioactive seeds and the ribbon shaped biopsy marker clip are present in the breast specimen, are completely intact, and are marked for pathology. The Tribell clip and the radioactive seed are present in the axillary node specimen, are completely intact, and are marked pathology. IMPRESSION: Specimen radiographs of the LEFT breast and LEFT axilla. Electronically Signed   By: Hulan Saas M.D.   On: 09/22/2021 13:42  MM Breast Surgical Specimen Result Date: 09/21/2021 CLINICAL DATA:  Assess surgical excision specimen following radioactive seed localization of left breast calcifications. Seeds replaced along the anterior-posterior extents bracketing the area of calcifications. EXAM: SPECIMEN RADIOGRAPH OF THE LEFT BREAST COMPARISON:  Previous exam(s). FINDINGS: Status post excision of the left breast. Both  radioactive seeds and the ribbon shaped biopsy marker clip are present, completely intact, and were marked for pathology. IMPRESSION: Specimen radiograph of the left breast. Electronically Signed   By: Amie Portland M.D.   On: 09/21/2021 16:26  MM LT RADIOACTIVE SEED LOC MAMMO GUIDE Result Date: 09/20/2021 CLINICAL DATA:  Patient presents for bracketed seed localization of the LEFT breast prior to lumpectomy. EXAM: MAMMOGRAPHIC GUIDED RADIOACTIVE SEED LOCALIZATION OF THE LEFT BREASTx 2 COMPARISON:  Previous exam(s). FINDINGS: Patient presents for radioactive seed localization prior to lumpectomy. I met with the patient and we discussed the procedure of seed localization including benefits and alternatives. We discussed the high likelihood of a successful procedure. We discussed the risks of the procedure including infection, bleeding, tissue injury and further surgery. We discussed the low dose of radioactivity involved in the procedure. Informed, written consent was given. The usual time-out protocol was performed immediately prior to the procedure. An interpreter was present for the entire procedure. Site1: Using mammographic guidance, sterile technique, 1% lidocaine and an I-125 radioactive seed, the anterior aspect of the pleomorphic calcifications was localized using a MEDIAL approach. The follow-up mammogram images confirm the seed in the expected location and were marked for Dr. Donell Beers. Follow-up survey of the patient confirms presence of the radioactive seed. Order number of I-125 seed:  [id number]. Total activity:  0.239 millicuries reference Date: 02/03/2022 Site 2: Using mammographic guidance, sterile technique, 1% lidocaine and an I-125 radioactive seed, the posterior aspect of the pleomorphic calcifications was localized using a MEDIAL approach. The follow-up mammogram images confirm the seed in the expected location and were marked for Dr. Donell Beers. Follow-up survey of the patient confirms presence of  the radioactive seed. Order number of I-125 seed:  [id number]. Total activity:  0.239 millicuries reference Date: 09/06/2021 The patient tolerated the procedure well and was released from the Breast Center. She  was given instructions regarding seed removal. Of note, linear calcifications are present posterior to the posterior seed. More posterior localization was not possible due to technical considerations. IMPRESSION: Bracketed radioactive seed localization LEFT breast. Suspicious calcifications extend posterior to the more posterior seed. See above. No apparent complications. Electronically Signed   By: Norva Pavlov M.D.   On: 09/20/2021 14:59  MM LT RAD SEED EA ADD LESION LOC MAMMO Result Date: 09/20/2021 CLINICAL DATA:  Patient presents for bracketed seed localization of the LEFT breast prior to lumpectomy. EXAM: MAMMOGRAPHIC GUIDED RADIOACTIVE SEED LOCALIZATION OF THE LEFT BREASTx 2 COMPARISON:  Previous exam(s). FINDINGS: Patient presents for radioactive seed localization prior to lumpectomy. I met with the patient and we discussed the procedure of seed localization including benefits and alternatives. We discussed the high likelihood of a successful procedure. We discussed the risks of the procedure including infection, bleeding, tissue injury and further surgery. We discussed the low dose of radioactivity involved in the procedure. Informed, written consent was given. The usual time-out protocol was performed immediately prior to the procedure. An interpreter was present for the entire procedure. Site1: Using mammographic guidance, sterile technique, 1% lidocaine and an I-125 radioactive seed, the anterior aspect of the pleomorphic calcifications was localized using a MEDIAL approach. The follow-up mammogram images confirm the seed in the expected location and were marked for Dr. Donell Beers. Follow-up survey of the patient confirms presence of the radioactive seed. Order number of I-125 seed:  [id number].  Total activity:  0.239 millicuries reference Date: 02/03/2022 Site 2: Using mammographic guidance, sterile technique, 1% lidocaine and an I-125 radioactive seed, the posterior aspect of the pleomorphic calcifications was localized using a MEDIAL approach. The follow-up mammogram images confirm the seed in the expected location and were marked for Dr. Donell Beers. Follow-up survey of the patient confirms presence of the radioactive seed. Order number of I-125 seed:  [id number]. Total activity:  0.239 millicuries reference Date: 09/06/2021 The patient tolerated the procedure well and was released from the Breast Center. She was given instructions regarding seed removal. Of note, linear calcifications are present posterior to the posterior seed. More posterior localization was not possible due to technical considerations. IMPRESSION: Bracketed radioactive seed localization LEFT breast. Suspicious calcifications extend posterior to the more posterior seed. See above. No apparent complications. Electronically Signed   By: Norva Pavlov M.D.   On: 09/20/2021 14:59  MM CLIP PLACEMENT LEFT Result Date: 08/26/2021 CLINICAL DATA:  Post ultrasound-guided biopsy of a mass with associated calcifications in the left breast at the 9:30 position and ultrasound-guided biopsy of an abnormal lymph node in the left axilla. EXAM: 3D DIAGNOSTIC LEFT MAMMOGRAM POST ULTRASOUND BIOPSY COMPARISON:  Previous exam(s). FINDINGS: 3D Mammographic images were obtained following ultrasound-guided biopsy of a mass with associated calcifications in the left breast at the 9:30 position and ultrasound-guided biopsy of an abnormal lymph node in the left axilla. A ribbon shaped biopsy marking clip is present at the site of the biopsied mass with associated calcifications in the left breast at the 9:30 position. A tri bell biopsy marking clip is present at the site of the biopsied lymph node in the left axilla. IMPRESSION: 1. Ribbon shaped biopsy marking  clip at site of biopsied mass in the left breast at the 9:30 position. 2. Tri bell shaped biopsy marking clip at site of biopsied lymph node in the left axilla. Final Assessment: Post Procedure Mammograms for Marker Placement Electronically Signed   By: Edwin Cap M.D.   On: 08/26/2021  13:56  MM DIAG BREAST TOMO UNI LEFT Result Date: 08/12/2021 CLINICAL DATA:  Patient was called back for evaluation of LEFT breast calcifications, focal asymmetry, and possible enlarged LEFT axillary lymph node from screening mammogram at [place] [facility] [place] on 05/20/2021. Patient was seen at the [facility] on 12/25/2019 for mass in the 9:30 o'clock location of the LEFT breast. Biopsy was recommended for this mass. 2 LEFT axillary lymph nodes with abnormal morphology were also identified at the time of that exam and biopsy was recommended of one of these lymph nodes. Biopsy has not been performed. EXAM: DIGITAL DIAGNOSTIC UNILATERAL LEFT MAMMOGRAM WITH TOMOSYNTHESIS AND CAD; ULTRASOUND LEFT BREAST LIMITED TECHNIQUE: Left digital diagnostic mammography and breast tomosynthesis was performed. The images were evaluated with computer-aided detection.; Targeted ultrasound examination of the left breast was performed. COMPARISON:  Multiple prior studies including 05/20/2021 [facility] [place]), 12/25/2019 and earlier (from the [facility]) ACR Breast Density Category b: There are scattered areas of fibroglandular density. FINDINGS: Spot compression and magnified views are performed of the LEFT breast. These views demonstrate a spiculated mass associated with linear and pleomorphic calcifications in the UPPER INNER QUADRANT of the LEFT breast. The calcifications and mass together span 3.7 x 1.8 x 1.2 centimeters. Targeted ultrasound is performed, showing an oval mass with indistinct margins and posterior acoustic shadowing in the 9:30 o'clock location of the LEFT breast 5  centimeters from the nipple. No internal blood flow identified on Doppler evaluation. Mass measures 0.7 x 0.9 x 0.5 centimeters. At real-time imaging, there is associated distortion in this region. Evaluation of the LEFT axilla shows 2 morphologically abnormal lymph nodes, 1 with cortical thickening of 1.0 centimeters. A second lymph node has cortical thickening of 3 millimeters. Other LEFT axillary lymph nodes have normal morphology. IMPRESSION: 1. Suspicious mass in the 9:30 o'clock location of the LEFT breast warranting tissue diagnosis. 2. Two LEFT axillary lymph nodes with abnormal morphology. RECOMMENDATION: 1. Recommend ultrasound-guided core biopsy of mass in the 9:30 o'clock location of the LEFT breast. Given the associated suspicious calcifications in this region, I would recommend bracketed localization to include calcifications if excision is needed. 2. Recommend ultrasound-guided core biopsy of the larger of the LEFT axillary lymph nodes. I have discussed the findings and recommendations with the patient with the assistance of an interpreter. If applicable, a reminder letter will be sent to the patient regarding the next appointment. BI-RADS CATEGORY  5: Highly suggestive of malignancy. Electronically Signed   By: Norva Pavlov M.D.   On: 08/12/2021 16:25   MM DIAG BREAST TOMO UNI LEFT Result Date: 12/25/2019 CLINICAL DATA:  Patient recalled from screening for left breast mass/distortion and calcifications. EXAM: DIGITAL DIAGNOSTIC LEFT MAMMOGRAM WITH CAD AND TOMO ULTRASOUND LEFT BREAST COMPARISON:  Previous exam(s). ACR Breast Density Category b: There are scattered areas of fibroglandular density. FINDINGS: Spot compression views and magnification views of the left breast were obtained. Within the upper inner left breast there is a persistent irregular mass with associated distortion. Extending anterior and lateral from this mass are suspicious pleomorphic calcifications measuring approximately  3.0 x 1.6 cm in dimension. Mammographic images were processed with CAD. Targeted ultrasound is performed, showing a 6 x 9 x 5 mm irregular hypoechoic mass left breast 9:30 o'clock 5 cm from nipple. There are 2 cortically thickened nodes within the left axilla, the largest demonstrating a cortex of 11 mm. IMPRESSION: 1. Suspicious mass left breast 9:30 o'clock 5 cm from the  nipple corresponding with focal area of distortion on mammography. 2. There are suspicious pleomorphic calcifications within the upper inner left breast at the site of the above described mass extending anterior and lateral from the mass. RECOMMENDATION: 1. Ultrasound-guided core needle biopsy left breast mass 9:30 o'clock. 2. Stereotactic guided core needle biopsy of the anterior lateral most aspect of the pleomorphic calcifications within the upper inner left breast at the site of suspicious mass. 3. Ultrasound-guided core needle biopsy of 1 of the cortically thickened left axillary lymph nodes. I have discussed the findings and recommendations with the patient. If applicable, a reminder letter will be sent to the patient regarding the next appointment. BI-RADS CATEGORY  5: Highly suggestive of malignancy. Electronically Signed   By: Annia Belt M.D.   On: 12/25/2019 12:44   MS DIGITAL SCREENING TOMO BILATERAL Addendum Date: 12/19/2019 ADDENDUM REPORT: 12/19/2019 13:02 ADDENDUM: The possible distortion associated with calcifications and possible mass is in the LEFT breast. I incorrectly stated the right breast on the report of the screening mammogram. I also now have available the tomosynthesis MLO images which were not available at the time of initial interpretation. These do not need to be repeated at the time of the diagnostic mammogram. Electronically Signed   By: Hulan Saas M.D.   On: 12/19/2019 13:02   Result Date: 12/19/2019 CLINICAL DATA:  Screening. Please note that the LEFT MLO tomosynthesis images are currently unavailable  due to technical difficulties, though all of the remaining images, including the synthesized LEFT MLO image, is available. EXAM: DIGITAL SCREENING BILATERAL MAMMOGRAM WITH TOMO AND CAD COMPARISON:  01/20/2012. ACR Breast Density Category b: There are scattered areas of fibroglandular density. FINDINGS: In the right breast, possible distortion associated with calcifications and associated with a possible mass warrants further evaluation. In the left breast, no findings suspicious for malignancy. Images were processed with CAD. IMPRESSION: Further evaluation is suggested for possible distortion associated with calcifications and associated with a possible mass in the right breast. RECOMMENDATION: Diagnostic mammogram and possibly ultrasound of the right breast. The LEFT MLO tomosynthesis images can repeated at the time of the diagnostic mammogram. (Code:[id number]) The patient will be contacted regarding the findings, and additional imaging will be scheduled. BI-RADS CATEGORY  0: Incomplete. Need additional imaging evaluation and/or prior mammograms for comparison. Electronically Signed: By: Hulan Saas M.D. On: 12/13/2019 16:10   Pelvic/Bimanual Pap is not indicated today per BCCCP guidelines.   Smoking History: Patient has never smoked.   Patient Navigation: Patient education provided. Access to services provided for patient through [facility] program. Spanish interpreter Natale Lay from [facility] provided.    Colorectal Cancer Screening: Per patient had a colonoscopy completed in 2010. FIT Test completed 04/06/2023 that was positive. Patient was referred to [facility] and did not schedule appointment. Explained the importance to patient about following up with GI and information given on the [facility] Assistance. Will send referral again. No complaints today.    Breast and Cervical Cancer Risk Assessment: Patient has a family history of her sister having breast cancer. Patient has a  personal history of breast cancer. Patient has no known genetic mutations or history of radiation treatment to the chest before age 9. Patient has no history of cervical dysplasia, immunocompromised, or DES exposure in-utero. Breast cancer risk assessment completed. No breast cancer risk calculated due to patient has personal history of breast cancer.  Risk Assessment   No risk assessment data    A: BCCCP exam without pap  smear No complaints.  P: Referred patient to the [facility] for a diagnostic mammogram. Appointment scheduled Thursday, September 14, 2023 at 1240.  Priscille Heidelberg, RN 09/14/2023 12:10 PM

## 2023-10-17 ENCOUNTER — Encounter: Payer: Self-pay | Admitting: Genetic Counselor

## 2023-11-06 ENCOUNTER — Ambulatory Visit: Payer: Self-pay | Attending: General Surgery

## 2023-11-06 VITALS — Wt 176.4 lb

## 2023-11-06 DIAGNOSIS — Z483 Aftercare following surgery for neoplasm: Secondary | ICD-10-CM | POA: Insufficient documentation

## 2023-11-06 NOTE — Therapy (Signed)
 OUTPATIENT PHYSICAL THERAPY SOZO SCREENING NOTE   Patient Name: Kelli Vaughn MRN: [id number] DOB:[birth year], 68 y.o., female Today's Date: 11/06/2023  PCP: Belvie Boyers, MD REFERRING PROVIDER: Lockie Rima, MD   PT End of Session - 11/06/23 1059     Visit Number 13   #  unchanged due to screen only   PT Start Time 1057    PT Stop Time 1100    PT Time Calculation (min) 3 min    Activity Tolerance Patient tolerated treatment well    Behavior During Therapy WFL for tasks assessed/performed             Past Medical History:  Diagnosis Date   Breast cancer (HCC)    left breast   Complication of anesthesia    Diabetes mellitus without complication (HCC)    TYPE II   Glaucoma    Hypertension    Personal history of radiation therapy    PONV (postoperative nausea and vomiting)    Past Surgical History:  Procedure Laterality Date   BREAST LUMPECTOMY WITH RADIOACTIVE SEED AND SENTINEL LYMPH NODE BIOPSY Left 09/21/2021   Procedure: LEFT BREAST LUMPECTOMY WITH RADIOACTIVE SEED X2 AND SENTINEL LYMPH NODE BIOPSY;  Surgeon: Lockie Rima, MD;  Location: [facility] OR;  Service: General;  Laterality: Left;   CESAREAN SECTION     X2   EYE SURGERY Bilateral 2019   cataract surgery   RADIOACTIVE SEED GUIDED AXILLARY SENTINEL LYMPH NODE Left 09/21/2021   Procedure: RADIOACTIVE SEED GUIDED AXILLARY SENTINEL LYMPH NODE BIOPSY;  Surgeon: Lockie Rima, MD;  Location: [facility] OR;  Service: General;  Laterality: Left;   Patient Active Problem List   Diagnosis Date Noted   Genetic testing 09/17/2021   Family history of breast cancer 09/01/2021   Malignant neoplasm of upper-inner quadrant of left breast in female, estrogen receptor positive (HCC) 08/31/2021    REFERRING DIAG: left breast cancer at risk for lymphedema  THERAPY DIAG:  Aftercare following surgery for neoplasm  PERTINENT HISTORY: Patient was diagnosed on 05/20/2021 with left grade II invasive ductal carcinoma breast  cancer. It measures 3.7 cm and is located in the upper inner quadrant. It is ER/PR positive and HER 2 negative with a Ki67 of 5%. 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 Surgery for left lumpectomy with SLNB was performed on 09/21/2021 with 1/5 LN's. Pt had a post op seroma   PRECAUTIONS: left UE Lymphedema risk, None  SUBJECTIVE: Pt returns for her 3 month L-Dex screen.   PAIN:  Are you having pain? No  SOZO SCREENING: Patient was assessed today using the SOZO machine to determine the lymphedema index score. This was compared to her baseline score. It was determined that she is within the recommended range when compared to her baseline and no further action is needed at this time. She will continue SOZO screenings. These are done every 3 months for 2 years post operatively followed by every 6 months for 2 years, and then annually.  *interpreter present for SOZO   L-DEX FLOWSHEETS - 11/06/23 1000       L-DEX LYMPHEDEMA SCREENING   Measurement Type Unilateral    L-DEX MEASUREMENT EXTREMITY Upper Extremity    POSITION  Standing    DOMINANT SIDE Right    At Risk Side Left    BASELINE SCORE (UNILATERAL) 1.5    L-DEX SCORE (UNILATERAL) 3.4    VALUE CHANGE (UNILAT) 1.9  Denyce Flank, PTA 11/06/2023, 11:01 AM

## 2023-11-28 ENCOUNTER — Other Ambulatory Visit: Payer: Self-pay | Admitting: *Deleted

## 2023-11-28 ENCOUNTER — Telehealth: Payer: Self-pay

## 2023-11-28 DIAGNOSIS — C50212 Malignant neoplasm of upper-inner quadrant of left female breast: Secondary | ICD-10-CM

## 2023-11-28 NOTE — Telephone Encounter (Signed)
 Attempted to call patient and got voicemail. I left a message to confirm her appointments for 5/21.

## 2023-11-29 ENCOUNTER — Inpatient Hospital Stay
Payer: No Typology Code available for payment source | Attending: Hematology and Oncology | Admitting: Hematology and Oncology

## 2023-11-29 ENCOUNTER — Inpatient Hospital Stay: Payer: No Typology Code available for payment source

## 2023-11-29 VITALS — BP 133/58 | HR 77 | Temp 98.4°F | Resp 16 | Wt 174.0 lb

## 2023-11-29 VITALS — BP 133/60 | HR 77 | Temp 98.0°F | Resp 17 | Ht 59.0 in | Wt 174.8 lb

## 2023-11-29 DIAGNOSIS — Z1732 Human epidermal growth factor receptor 2 negative status: Secondary | ICD-10-CM | POA: Insufficient documentation

## 2023-11-29 DIAGNOSIS — E119 Type 2 diabetes mellitus without complications: Secondary | ICD-10-CM | POA: Insufficient documentation

## 2023-11-29 DIAGNOSIS — Z1721 Progesterone receptor positive status: Secondary | ICD-10-CM | POA: Insufficient documentation

## 2023-11-29 DIAGNOSIS — Z79811 Long term (current) use of aromatase inhibitors: Secondary | ICD-10-CM | POA: Insufficient documentation

## 2023-11-29 DIAGNOSIS — C50212 Malignant neoplasm of upper-inner quadrant of left female breast: Secondary | ICD-10-CM

## 2023-11-29 DIAGNOSIS — M2550 Pain in unspecified joint: Secondary | ICD-10-CM | POA: Insufficient documentation

## 2023-11-29 DIAGNOSIS — Z7985 Long-term (current) use of injectable non-insulin antidiabetic drugs: Secondary | ICD-10-CM | POA: Insufficient documentation

## 2023-11-29 DIAGNOSIS — Z923 Personal history of irradiation: Secondary | ICD-10-CM | POA: Insufficient documentation

## 2023-11-29 DIAGNOSIS — M7989 Other specified soft tissue disorders: Secondary | ICD-10-CM | POA: Insufficient documentation

## 2023-11-29 DIAGNOSIS — Z87891 Personal history of nicotine dependence: Secondary | ICD-10-CM | POA: Insufficient documentation

## 2023-11-29 DIAGNOSIS — Z800 Family history of malignant neoplasm of digestive organs: Secondary | ICD-10-CM | POA: Insufficient documentation

## 2023-11-29 DIAGNOSIS — R634 Abnormal weight loss: Secondary | ICD-10-CM | POA: Insufficient documentation

## 2023-11-29 DIAGNOSIS — M810 Age-related osteoporosis without current pathological fracture: Secondary | ICD-10-CM | POA: Insufficient documentation

## 2023-11-29 DIAGNOSIS — Z803 Family history of malignant neoplasm of breast: Secondary | ICD-10-CM | POA: Insufficient documentation

## 2023-11-29 DIAGNOSIS — Z170 Estrogen receptor positive status [ER+]: Secondary | ICD-10-CM | POA: Insufficient documentation

## 2023-11-29 LAB — CBC WITH DIFFERENTIAL (CANCER CENTER ONLY)
Abs Immature Granulocytes: 0.03 10*3/uL (ref 0.00–0.07)
Basophils Absolute: 0.1 10*3/uL (ref 0.0–0.1)
Basophils Relative: 1 %
Eosinophils Absolute: 0.3 10*3/uL (ref 0.0–0.5)
Eosinophils Relative: 3 %
HCT: 39 % (ref 36.0–46.0)
Hemoglobin: 13.3 g/dL (ref 12.0–15.0)
Immature Granulocytes: 0 %
Lymphocytes Relative: 25 %
Lymphs Abs: 2.1 10*3/uL (ref 0.7–4.0)
MCH: 29.6 pg (ref 26.0–34.0)
MCHC: 34.1 g/dL (ref 30.0–36.0)
MCV: 86.7 fL (ref 80.0–100.0)
Monocytes Absolute: 0.7 10*3/uL (ref 0.1–1.0)
Monocytes Relative: 8 %
Neutro Abs: 5.3 10*3/uL (ref 1.7–7.7)
Neutrophils Relative %: 63 %
Platelet Count: 319 10*3/uL (ref 150–400)
RBC: 4.5 MIL/uL (ref 3.87–5.11)
RDW: 12.9 % (ref 11.5–15.5)
WBC Count: 8.3 10*3/uL (ref 4.0–10.5)
nRBC: 0 % (ref 0.0–0.2)

## 2023-11-29 LAB — CMP (CANCER CENTER ONLY)
ALT: 18 U/L (ref 0–44)
AST: 14 U/L — ABNORMAL LOW (ref 15–41)
Albumin: 4.1 g/dL (ref 3.5–5.0)
Alkaline Phosphatase: 84 U/L (ref 38–126)
Anion gap: 6 (ref 5–15)
BUN: 13 mg/dL (ref 8–23)
CO2: 27 mmol/L (ref 22–32)
Calcium: 10.2 mg/dL (ref 8.9–10.3)
Chloride: 103 mmol/L (ref 98–111)
Creatinine: 0.45 mg/dL (ref 0.44–1.00)
GFR, Estimated: >60 mL/min (ref >60)
Glucose, Bld: 213 mg/dL — ABNORMAL HIGH (ref 70–99)
Potassium: 3.7 mmol/L (ref 3.5–5.1)
Sodium: 136 mmol/L (ref 135–145)
Total Bilirubin: 0.3 mg/dL (ref 0.0–1.2)
Total Protein: 7 g/dL (ref 6.5–8.1)

## 2023-11-29 MED ORDER — SODIUM CHLORIDE 0.9 % IV SOLN: INTRAVENOUS | Status: DC

## 2023-11-29 MED ORDER — ZOLEDRONIC ACID 4 MG/100ML IV SOLN
4.0000 mg | Freq: Once | INTRAVENOUS | Status: AC
  Administered 2023-11-29: 4 mg via INTRAVENOUS
  Filled 2023-11-29: qty 100

## 2023-11-29 MED ORDER — SODIUM CHLORIDE 0.9% FLUSH: 10.0000 mL | Freq: Two times a day (BID) | INTRAVENOUS | Status: DC

## 2023-11-29 NOTE — Patient Instructions (Signed)
Zoledronic Acid Injection (Cancer) Qu es este medicamento? El CIDO ZOLEDRNICO trata los niveles altos de calcio en la sangre causados por Management consultant. Tambin podra usarse junto con la quimioterapia para tratar los [employer] debilitados a causa del Database administrator. Acta desacelerando la liberacin de calcio de los [place]. Esto reduce los niveles de [employer]. Tambin fortalece los huesos y hace que sea menos probable que se rompan (fracturen). Pertenece a un grupo de medicamentos llamados bifosfonatos. Este medicamento puede ser utilizado para otros usos; si tiene alguna pregunta consulte con su proveedor de atencin mdica o con su farmacutico. MARCAS COMUNES: Zometa, Zometa Powder Qu le debo informar a mi profesional de la salud antes de tomar este medicamento? Necesitan saber si usted presenta alguno de los siguientes problemas o situaciones: Deshidratacin Enfermedad dental Enfermedad renal Enfermedad heptica Niveles bajos de calcio en la sangre Enfermedad pulmonar o respiratoria, como asma Recibe medicamentos esteroideos, tales como dexametasona o prednisona Una reaccin alrgica o inusual al cido zoledrnico, a otros medicamentos, alimentos, colorantes o conservantes Si est embarazada o buscando quedar embarazada Si est amamantando a un beb Cmo debo utilizar este medicamento? Este medicamento se inyecta en una vena. Su equipo de atencin lo [employer] en un hospital o en un entorno clnico. Hable con su equipo de atencin sobre el uso de este medicamento en nios. Puede requerir atencin especial. Sobredosis: Pngase en contacto inmediatamente con un centro toxicolgico o una sala de urgencia si usted cree que haya tomado demasiado medicamento.<br>ATENCIN: [employer] es solo para usted. No comparta este medicamento con nadie. Qu sucede si me olvido de una dosis? Cumpla con las citas para dosis de seguimiento. Es importante no olvidar ninguna dosis. Llame a su equipo  de atencin si no puede asistir a una cita. Qu puede interactuar con este medicamento? Ciertos antibiticos inyectables Diurticos, tales como bumetanida, furosemida AINE, medicamentos para Chief Technology Officer y Futures trader, tales como ibuprofeno o naproxeno Teriparatida Talidomida Puede ser que esta lista no menciona todas las posibles interacciones. Informe a su profesional de [employer] de [employer] Inc productos a base de hierbas, medicamentos de [place] o suplementos nutritivos que est tomando. Si usted fuma, consume bebidas alcohlicas o si utiliza drogas ilegales, indqueselo tambin a su profesional de [employer]. Algunas sustancias pueden interactuar con su medicamento. A qu debo estar atento al usar [employer]PL Corporation? Visite a su equipo de atencin para que revise su evolucin peridicamente. Es posible que pase un tiempo antes de que pueda notar los beneficios de [place]. Algunas personas que toman este medicamento tienen dolor intenso en huesos, articulaciones o msculos. Este medicamento tambin puede aumentar su riesgo de problemas en la mandbula o de tener una fractura de fmur. Informe a su equipo de atencin de inmediato si tiene dolor intenso en la [place], los [place], las articulaciones o los msculos. Informe a su equipo de atencin si tiene dolor que no desaparece o que empeora. Informe a su dentista y a su Airline pilot que est usando este medicamento. No deben realizarle [place] dental mayor mientras [place] este medicamento. Antes de comenzar a Producer, television/film/video, visite a su dentista para Physicist, medical un examen y arregle TEFL teacher. Cuide bien sus dientes mientras [place] este medicamento. Asegrese de visitar a su dentista para citas de seguimiento peridicas. Debe asegurarse de recibir suficiente calcio y vitamina D mientras [place] este medicamento. Converse con su equipo de atencin [employer]Star que come y las vitaminas que toma. Consulte con  su  equipo de atencin si tiene diarrea grave, nuseas y vmitos, o sudoracin intensa. La prdida de demasiado lquido corporal podra hacer que sea peligroso usar [employer] Corporation. Usted podra necesitar realizarse [employer]AM[employer] Corporation de sangre mientras est usando [place]. Informe a su equipo de atencin si est buscando un embarazo o si cree que podra estar en embarazo. Este medicamento puede causar defectos congnitos graves. Qu efectos secundarios puedo tener al [employer] este medicamento? Efectos secundarios que debe informar a su equipo de atencin tan pronto como sea posible: Reacciones alrgicas: erupcin cutnea, comezn/picazn, urticaria, hinchazn de la cara, los labios, la lengua o la garganta Lesin en los riones: disminucin en la cantidad de orina, hinchazn de los tobillos, las manos o los pies Nivel bajo de calcio: Engineer, mining o calambres musculares, confusin, hormigueo o entumecimiento en manos o pies Osteonecrosis de la mandbula: dolor, hinchazn o enrojecimiento en la boca, entumecimiento de la [place], mala cicatrizacin despus de un trabajo odontolgico, secrecin inusual de la boca, huesos visibles en la boca Dolor intenso en los [place], las articulaciones o los msculos Efectos secundarios que generalmente no requieren atencin mdica (debe informarlos a su equipo de atencin si persisten o si son molestos): Estreimiento Restaurant manager, fast food Prdida del apetito Nuseas Dolor estomacal Puede ser que esta lista no menciona todos los posibles efectos secundarios. Comunquese a su mdico por asesoramiento mdico [employer]lett[employer]ard. Usted puede informar los efectos secundarios a la FDA por telfono al 1-800-FDA-1088. Dnde debo guardar mi medicina? Este medicamento se administra en hospitales o clnicas. No se guarda en su casa. <b>ATENCIN: Este folleto es un resumen. Puede ser que no cubra toda la posible informacin. Si usted tiene preguntas acerca de esta medicina,  consulte con su mdico, su farmacutico o su profesional de Radiographer, therapeutic.</b>  2024 Elsevier/Gold Standard (2022-08-02 00:00:00)

## 2023-11-29 NOTE — Progress Notes (Signed)
 [facility] CONSULT NOTE  Patient Care Team: Belvie Boyers, MD as PCP - General (Family Medicine) Lockie Rima, MD as Consulting Physician (General Surgery) Murleen Arms, MD as Consulting Physician (Hematology and Oncology) Johna Myers, MD as Consulting Physician (Radiation Oncology) Debbie Fails, Laura Polio, NP as Nurse Practitioner (Hematology and Oncology) Neda Balk, RN as Registered Nurse  CHIEF COMPLAINTS/PURPOSE OF CONSULTATION:  Follow up  HISTORY OF PRESENTING ILLNESS:   Kelli Vaughn 68 y.o. female is here because of recent diagnosis of left IDC. I reviewed her records extensively and collaborated the history with the patient.  SUMMARY OF ONCOLOGIC HISTORY: Oncology History Overview Note  Patient was called back for evaluation of LEFT breast calcifications, focal asymmetry, and possible enlarged LEFT axillary lymph node from screening mammogram at [facility] [place]  on 05/20/2021. 08/12/2021, Suspicious mass in the 9:30 o'clock location of the LEFT breast warranting tissue diagnosis. Two LEFT axillary lymph nodes with abnormal morphology. 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 Lymph node biopsy of left axilla negative, lymph node biopsy results were thought to be discordant.   Patient does recollect being asked to schedule biopsy back in 2021 for abnormal mammogram but she was worried or had this misinformation that biopsy can let the breast cancer spread to other parts hence she decided not to proceed with biopsy at that time. She is here with her 2 sons, speaks in [place], Spanish interpreter present at the time of my visit.  Son also help with the translation.   Malignant neoplasm of upper-inner quadrant of left breast in female, estrogen receptor positive (HCC)  08/31/2021 Initial Diagnosis   Malignant neoplasm of upper-inner  quadrant of left breast in female, estrogen receptor positive (HCC)   09/01/2021 Cancer Staging   Staging form: Breast, AJCC 8th Edition - Clinical stage from 09/01/2021: Stage Unknown (cT1b, cNX, cM0, G2, ER+, PR+, HER2-) - Signed by Murleen Arms, MD on 09/01/2021 Stage prefix: Initial diagnosis Histologic grading system: 3 grade system    Genetic Testing   [facility] CancerNext Panel was Negative. 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 Report date is 09/20/2021.  The CancerNext gene panel offered by [employer] includes sequencing, rearrangement analysis, and RNA analysis for the following 36 genes:   APC, ATM, AXIN2, BARD1, BMPR1A, BRCA1, BRCA2, BRIP1, CDH1, CDK4, CDKN2A, CHEK2, DICER1, HOXB13, EPCAM, GREM1, MLH1, MSH2, MSH3, MSH6, MUTYH, NBN, NF1, NTHL1, PALB2, PMS2, POLD1, POLE, PTEN, RAD51C, RAD51D, RECQL, SMAD4, SMARCA4, STK11, and TP53.    09/21/2021 Surgery   Patient had left breast lumpectomy with a sentinel lymph node excision on 09/21/2021.  Final pathology showed invasive carcinoma of no special type, 13 mm in greatest dimension, grade 2, DCIS grade 2, all margins negative for invasive carcinoma and DCIS, distance from invasive carcinoma to closest margin is 0.5 mm, posterior margin and distance from DCIS to closest margin, 1 mm posterior margin.  5 axillary lymph nodes removed.  1 axillary lymph node with micrometastasis, metastatic deposit of 0.3 mm in greatest dimension.   Prognostics ER +80% moderate weak staining intensity, PR +100% strong staining intensity and HER2 negative    Oncotype testing   Oncotype DX score of 11.  Distant recurrence risk at 9 years of 13% with antiestrogen therapy.  No benefit from chemotherapy   11/11/2021 - 12/09/2021 Radiation Therapy    Treatment  Dates: 11/11/2021 through 12/09/2021 Site Technique Total Dose (Gy) Dose per Fx (Gy) Completed Fx Beam Energies  Breast, Left: Breast_L 3D 42.56/42.56 2.66 16/16 10X   Breast, Left: Breast_L_Bst 3D 8/8 2 4/4 6X, 10X      12/2021 -  Anti-estrogen oral therapy   Anastrozole     Interval history  Discussed the use of AI scribe software for clinical note transcription with the patient, who gave verbal consent to proceed.  History of Present Illness  Kelli Vaughn is a 68 year old female with breast cancer and osteoporosis who presents with joint pain and concerns about medication side effects.  She experiences significant pain in her shoulders and arms, particularly at night, described as 'bone pain'. This pain began after starting anastrozole , which she takes daily for breast cancer. The pain is intermittent, occurring some nights but not others. Hand exercises sometimes alleviate the pain, although recently she had to repeat the exercises multiple times for relief.  She experienced an allergic reaction following a Zometa  infusion in November, characterized by swelling of one eye, which took over a week to resolve. She was treated with prednisone eye drops prescribed by an ophthalmologist. The swelling was significant enough to close her eye, and she experienced intense pain. She is concerned about the possibility of this reaction recurring with future infusions.  She has lost approximately 14 pounds, which she attributes to the use of wegovy for diabetes management. She experiences swelling in her legs, particularly in the evenings. No sweating during the day.   MEDICAL HISTORY:  Past Medical History:  Diagnosis Date   Breast cancer (HCC)    left breast   Complication of anesthesia    Diabetes mellitus without complication (HCC)    TYPE II   Glaucoma    Hypertension    Personal history of radiation therapy    PONV (postoperative nausea and vomiting)     SURGICAL HISTORY: Past Surgical History:  Procedure Laterality Date   BREAST LUMPECTOMY WITH RADIOACTIVE SEED AND SENTINEL LYMPH NODE BIOPSY Left 09/21/2021   Procedure: LEFT BREAST  LUMPECTOMY WITH RADIOACTIVE SEED X2 AND SENTINEL LYMPH NODE BIOPSY;  Surgeon: Lockie Rima, MD;  Location: [facility] OR;  Service: General;  Laterality: Left;   CESAREAN SECTION     X2   EYE SURGERY Bilateral 2019   cataract surgery   RADIOACTIVE SEED GUIDED AXILLARY SENTINEL LYMPH NODE Left 09/21/2021   Procedure: RADIOACTIVE SEED GUIDED AXILLARY SENTINEL LYMPH NODE BIOPSY;  Surgeon: Lockie Rima, MD;  Location: [facility] OR;  Service: General;  Laterality: Left;    SOCIAL HISTORY: Social History   Socioeconomic History   Marital status: Divorced    Spouse name: Not on file   Number of children: 2   Years of education: Not on file   Highest education level: Some college, no degree  Occupational History   Not on file  Tobacco Use   Smoking status: Former    Types: Cigarettes   Smokeless tobacco: Never  Vaping Use   Vaping status: Never Used  Substance and Sexual Activity   Alcohol use: No   Drug use: No   Sexual activity: Not Currently    Birth control/protection: Post-menopausal  Other Topics Concern   Not on file  Social History Narrative   Pt was born in [place]   Social Drivers of Health   Financial Resource Strain: Not on file  Food Insecurity: No Food Insecurity (09/14/2023)   Hunger Vital Sign    Worried About Running  Out of Food in the Last Year: Never true    Ran Out of Food in the Last Year: Never true  Transportation Needs: No Transportation Needs (09/14/2023)   PRAPARE - Administrator, Civil Service (Medical): No    Lack of Transportation (Non-Medical): No  Physical Activity: Not on file  Stress: Not on file  Social Connections: Not on file  Intimate Partner Violence: Not on file    FAMILY HISTORY: Family History  Problem Relation Age of Onset   Heart disease Mother    Bone cancer Father    Breast cancer Sister        dx. 30s   Stomach cancer Sister        dx. 30s, unsure if second primary or metastasis   Breast cancer Niece     ALLERGIES:  is  allergic to insulin.  MEDICATIONS:  Current Outpatient Medications  Medication Sig Dispense Refill   anastrozole  (ARIMIDEX ) 1 MG tablet Take 1 tablet (1 mg total) by mouth daily. 90 tablet 3   aspirin EC 81 MG tablet Take 81 mg by mouth at bedtime.     cholecalciferol (VITAMIN D3) 25 MCG (1000 UNIT) tablet Take 1,000 Units by mouth daily. (Patient not taking: Reported on 09/14/2023)     gabapentin (NEURONTIN) 300 MG capsule Take 300 mg by mouth daily at 12 noon. (Patient not taking: Reported on 03/28/2023)     glipiZIDE (GLUCOTROL) 10 MG tablet Take 10 mg by mouth daily before breakfast.     hydrochlorothiazide (HYDRODIURIL) 25 MG tablet Take 25 mg by mouth in the morning.     ibuprofen  (ADVIL ,MOTRIN ) 800 MG tablet Take 1,600 mg by mouth every 8 (eight) hours as needed for moderate pain.     insulin glargine (LANTUS) 100 UNIT/ML injection Inject 35 Units into the skin 2 (two) times daily.     latanoprost (XALATAN) 0.005 % ophthalmic solution 1 drop every morning.     lisinopril (ZESTRIL) 10 MG tablet Take 10 mg by mouth in the morning.     omeprazole (PRILOSEC) 40 MG capsule Take by mouth.     Semaglutide (OZEMPIC, 1 MG/DOSE, [place]) Inject into the skin. Injection once a week     SitaGLIPtin-MetFORMIN HCl (JANUMET XR) 50-1000 MG TB24 Take 1 tablet by mouth in the morning and at bedtime.     Vitamin D , Ergocalciferol , (DRISDOL ) 1.25 MG (50000 UNIT) CAPS capsule Take 1 capsule (50,000 Units total) by mouth every 7 (seven) days. (Patient not taking: Reported on 09/14/2023) 12 capsule 0   No current facility-administered medications for this visit.     PHYSICAL EXAMINATION: ECOG PERFORMANCE STATUS: 0 - Asymptomatic Physical Exam Constitutional:      Appearance: Normal appearance.  Cardiovascular:     Pulses: Normal pulses.     Heart sounds: Normal heart sounds.  Pulmonary:     Effort: Pulmonary effort is normal.     Breath sounds: Normal breath sounds.  Chest:     Comments: Bilateral breasts  inspected. No palpable masses or regional adenopathy Musculoskeletal:     Cervical back: Normal range of motion. No rigidity.  Lymphadenopathy:     Cervical: No cervical adenopathy.  Neurological:     Mental Status: She is alert.      LABORATORY DATA:  I have reviewed the data as listed Lab Results  Component Value Date   WBC 8.3 11/29/2023   HGB 13.3 11/29/2023   HCT 39.0 11/29/2023   MCV 86.7 11/29/2023   PLT  319 11/29/2023   Lab Results  Component Value Date   NA 136 11/29/2023   K 3.7 11/29/2023   CL 103 11/29/2023   CO2 27 11/29/2023    RADIOGRAPHIC STUDIES: I have personally reviewed the radiological reports and agreed with the findings in the report.  ASSESSMENT AND PLAN:  Malignant neoplasm of upper-inner quadrant of left breast in female, estrogen receptor positive (HCC) This is a very pleasant 68 year old female patient with past medical history significant for type 2 diabetes mellitus, hypertension who had an abnormal mammogram back in 2021 with 6 mm breast mass in the left breast at 930 o'clock as well as left abnormal axillary lymph nodes, did not undergo biopsy at that time as recommended who presented with abnormal mammogram again which led to further testing.  Left breast needle core biopsy at this time showed invasive ductal carcinoma, grade 2 along with DCIS, ER +80% strong intensity, PR +100% strong intensity, KI 5% and HER2 negative.   She was presented in the breast [facility] and recommendation was to proceed with surgery upfront followed by adjuvant recommendations based on final pathology.  Final path showed left invasive ductal carcinoma, 13 mm in greatest dimension, Nottingham grade 2, DCIS grade 2, all margins negative, 1 out of 5 axillary lymph nodes with micrometastasis, tumor measuring 1.3 mm. Progs were repeated and issued as an addendum. This is ER moderate- weak staining 80%, PR 100%, Her 2 neg.  Oncotype DX showed a score of 11, there appears to be no  benefit from chemotherapy.   Distant recurrence risk at 9 years of 13% with antiestrogen alone.   She is now s/p adjuvant radiation and is on anastrozole .   Assessment and Plan Assessment & Plan Breast cancer treatment with Anastrozole  She adheres to Anastrozole , experiencing intermittent joint pain likely due to the medication. Pain occurs mostly at night but is not constant. She is reluctant to switch to a different medication  Possible allergic reaction to Zometa  Unilateral eye and conjunctival swelling resolved with prednisone eye drops. Reaction is uncommon and has been rarely reported with Zometa . She is willing to retry the infusion. - I contacted the ophthalmologist's office. They mentioned she only had chemosis and no severe inflammation, hence it is reasonable to try the medication again. If this happens after today's infusion, she was encouraged to call the office.  Osteoporosis Osteoporosis present, potentially exacerbated by breast cancer treatment.  - Consider alternative treatments such as Prolia or oral bisphosphonates if further infusion reaction happens.  Murleen Arms MD   Time spent: 40 min including coordination with ophthalmology office, history, review of records, physical exam, counseling and coordination of care.

## 2023-11-29 NOTE — Assessment & Plan Note (Addendum)
 This is a very pleasant 68 year old female patient with past medical history significant for type 2 diabetes mellitus, hypertension who had an abnormal mammogram back in 2021 with 6 mm breast mass in the left breast at 930 o'clock as well as left abnormal axillary lymph nodes, did not undergo biopsy at that time as recommended who presented with abnormal mammogram again which led to further testing.  Left breast needle core biopsy at this time showed invasive ductal carcinoma, grade 2 along with DCIS, ER +80% strong intensity, PR +100% strong intensity, KI 5% and HER2 negative.   She was presented in the breast [facility] and recommendation was to proceed with surgery upfront followed by adjuvant recommendations based on final pathology.  Final path showed left invasive ductal carcinoma, 13 mm in greatest dimension, Nottingham grade 2, DCIS grade 2, all margins negative, 1 out of 5 axillary lymph nodes with micrometastasis, tumor measuring 1.3 mm. Progs were repeated and issued as an addendum. This is ER moderate- weak staining 80%, PR 100%, Her 2 neg.  Oncotype DX showed a score of 11, there appears to be no benefit from chemotherapy.   Distant recurrence risk at 9 years of 13% with antiestrogen alone.   She is now s/p adjuvant radiation and is on anastrozole .   Assessment and Plan Assessment & Plan Breast cancer treatment with Anastrozole  She adheres to Anastrozole , experiencing intermittent joint pain likely due to the medication. Pain occurs mostly at night but is not constant. She is reluctant to switch to a different medication  Possible allergic reaction to Zometa  Unilateral eye and conjunctival swelling resolved with prednisone eye drops. Reaction is uncommon and has been rarely reported with Zometa . She is willing to retry the infusion. - I contacted the ophthalmologist's office. They mentioned she only had chemosis and no severe inflammation, hence it is reasonable to try the medication again. If  this happens after today's infusion, she was encouraged to call the office.  Osteoporosis Osteoporosis present, potentially exacerbated by breast cancer treatment.  - Consider alternative treatments such as Prolia or oral bisphosphonates if further infusion reaction happens.  Murleen Arms MD

## 2023-11-30 ENCOUNTER — Encounter: Payer: Self-pay | Admitting: Hematology and Oncology

## 2023-12-08 ENCOUNTER — Telehealth: Payer: Self-pay | Admitting: *Deleted

## 2023-12-08 NOTE — Telephone Encounter (Signed)
 This RN received a call from the pt's son- Edar- stating onset of symptoms this week post zometa  infusion on 5/21.  He states his mother is having some visual changes - not as severe as before- but having "like blurred vision and little black dots"  " She is having some mild pain when she wakes up."  Above is not interfering in her ADL's.  Per inquiry pt is not on calcium.  Pt is taking in good fluid intake of over a liter a day.  Per discussion- recommendation given for pt to take 2 tums a day presently and monitor symptoms with request to call this RN early next week.  Edar verbalized  understanding - no further concerns.

## 2023-12-13 ENCOUNTER — Telehealth: Payer: Self-pay | Admitting: *Deleted

## 2023-12-13 NOTE — Telephone Encounter (Signed)
 This RN spoke with the pt's son-Edar- per his return call today per update - he states one eye is still mildly foggy in vision with minimal swelling.  Inquired as per MD's prior recommendation if pt has contacted her ophthalmologist - Edar states they have not but will call for visit.  Presently she will apply cool compresses and use claritin.

## 2024-02-05 ENCOUNTER — Ambulatory Visit: Attending: General Surgery

## 2024-02-05 VITALS — Wt 173.5 lb

## 2024-02-05 DIAGNOSIS — Z483 Aftercare following surgery for neoplasm: Secondary | ICD-10-CM | POA: Insufficient documentation

## 2024-02-05 NOTE — Therapy (Signed)
 OUTPATIENT PHYSICAL THERAPY SOZO SCREENING NOTE   Patient Name: Kelli Vaughn MRN: [id number] DOB:[birth year], 68 y.o., female Today's Date: 02/05/2024  PCP: Kayla Drivers, MD REFERRING PROVIDER: Aron Shoulders, MD   PT End of Session - 02/05/24 [phone]     Visit Number 13   # unchanged due to screen only   PT Start Time 0922    PT Stop Time 0926    PT Time Calculation (min) 4 min    Activity Tolerance Patient tolerated treatment well    Behavior During Therapy [facility] for tasks assessed/performed          Past Medical History:  Diagnosis Date   Breast cancer (HCC)    left breast   Complication of anesthesia    Diabetes mellitus without complication (HCC)    TYPE II   Glaucoma    Hypertension    Personal history of radiation therapy    PONV (postoperative nausea and vomiting)    Past Surgical History:  Procedure Laterality Date   BREAST LUMPECTOMY WITH RADIOACTIVE SEED AND SENTINEL LYMPH NODE BIOPSY Left 09/21/2021   Procedure: LEFT BREAST LUMPECTOMY WITH RADIOACTIVE SEED X2 AND SENTINEL LYMPH NODE BIOPSY;  Surgeon: Aron Shoulders, MD;  Location: [facility] OR;  Service: General;  Laterality: Left;   CESAREAN SECTION     X2   EYE SURGERY Bilateral 2019   cataract surgery   RADIOACTIVE SEED GUIDED AXILLARY SENTINEL LYMPH NODE Left 09/21/2021   Procedure: RADIOACTIVE SEED GUIDED AXILLARY SENTINEL LYMPH NODE BIOPSY;  Surgeon: Aron Shoulders, MD;  Location: [facility] OR;  Service: General;  Laterality: Left;   Patient Active Problem List   Diagnosis Date Noted   Genetic testing 09/17/2021   Family history of breast cancer 09/01/2021   Malignant neoplasm of upper-inner quadrant of left breast in female, estrogen receptor positive (HCC) 08/31/2021    REFERRING DIAG: left breast cancer at risk for lymphedema  THERAPY DIAG: Aftercare following surgery for neoplasm  PERTINENT HISTORY: Patient was diagnosed on 05/20/2021 with left grade II invasive ductal carcinoma breast cancer. It  measures 3.7 cm and is located in the upper inner quadrant. It is ER/PR positive and HER 2 negative with a Ki67 of 5%. 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 Surgery for left lumpectomy with SLNB was performed on 09/21/2021 with 1/5 LN's. Pt had a post op seroma   PRECAUTIONS: left UE Lymphedema risk, None  SUBJECTIVE: Pt returns for her 3 month L-Dex screen.   PAIN:  Are you having pain? No  SOZO SCREENING: Patient was assessed today using the SOZO machine to determine the lymphedema index score. This was compared to her baseline score. It was determined that she is within the recommended range when compared to her baseline and no further action is needed at this time. She will continue SOZO screenings. These are done every 3 months for 2 years post operatively followed by every 6 months for 2 years, and then annually.  *interpreter present for SOZO   L-DEX FLOWSHEETS - 02/05/24 0900       L-DEX LYMPHEDEMA SCREENING   Measurement Type Unilateral    L-DEX MEASUREMENT EXTREMITY Upper Extremity    POSITION  Standing    DOMINANT SIDE Right    At Risk Side Left    BASELINE SCORE (UNILATERAL) 1.5    L-DEX SCORE (UNILATERAL) -1.9    VALUE CHANGE (UNILAT) -3.4            Aden Berwyn Caldron, PTA 02/05/2024, 9:25  AM

## 2024-05-08 ENCOUNTER — Other Ambulatory Visit: Payer: Self-pay

## 2024-05-08 MED ORDER — ANASTROZOLE 1 MG PO TABS: 1.0000 mg | ORAL_TABLET | Freq: Every day | ORAL | 3 refills | Status: AC

## 2024-06-03 ENCOUNTER — Other Ambulatory Visit: Payer: Self-pay

## 2024-06-03 DIAGNOSIS — C50212 Malignant neoplasm of upper-inner quadrant of left female breast: Secondary | ICD-10-CM

## 2024-06-04 ENCOUNTER — Inpatient Hospital Stay: Payer: Self-pay | Attending: Hematology and Oncology

## 2024-06-04 ENCOUNTER — Inpatient Hospital Stay: Payer: Self-pay

## 2024-06-04 ENCOUNTER — Inpatient Hospital Stay (HOSPITAL_BASED_OUTPATIENT_CLINIC_OR_DEPARTMENT_OTHER): Payer: Self-pay | Admitting: Hematology and Oncology

## 2024-06-04 VITALS — BP 142/57 | HR 86 | Temp 97.8°F | Resp 17 | Wt 172.8 lb

## 2024-06-04 DIAGNOSIS — C50212 Malignant neoplasm of upper-inner quadrant of left female breast: Secondary | ICD-10-CM | POA: Insufficient documentation

## 2024-06-04 DIAGNOSIS — E119 Type 2 diabetes mellitus without complications: Secondary | ICD-10-CM | POA: Insufficient documentation

## 2024-06-04 DIAGNOSIS — Z170 Estrogen receptor positive status [ER+]: Secondary | ICD-10-CM | POA: Insufficient documentation

## 2024-06-04 DIAGNOSIS — R634 Abnormal weight loss: Secondary | ICD-10-CM | POA: Insufficient documentation

## 2024-06-04 DIAGNOSIS — Z923 Personal history of irradiation: Secondary | ICD-10-CM | POA: Insufficient documentation

## 2024-06-04 DIAGNOSIS — Z800 Family history of malignant neoplasm of digestive organs: Secondary | ICD-10-CM | POA: Insufficient documentation

## 2024-06-04 DIAGNOSIS — Z803 Family history of malignant neoplasm of breast: Secondary | ICD-10-CM | POA: Insufficient documentation

## 2024-06-04 DIAGNOSIS — M810 Age-related osteoporosis without current pathological fracture: Secondary | ICD-10-CM | POA: Insufficient documentation

## 2024-06-04 DIAGNOSIS — Z87891 Personal history of nicotine dependence: Secondary | ICD-10-CM | POA: Insufficient documentation

## 2024-06-04 DIAGNOSIS — Z79811 Long term (current) use of aromatase inhibitors: Secondary | ICD-10-CM | POA: Insufficient documentation

## 2024-06-04 DIAGNOSIS — Z7985 Long-term (current) use of injectable non-insulin antidiabetic drugs: Secondary | ICD-10-CM | POA: Insufficient documentation

## 2024-06-04 LAB — CMP (CANCER CENTER ONLY)
ALT: 21 U/L (ref 0–44)
AST: 18 U/L (ref 15–41)
Albumin: 4.3 g/dL (ref 3.5–5.0)
Alkaline Phosphatase: 91 U/L (ref 38–126)
Anion gap: 10 (ref 5–15)
BUN: 18 mg/dL (ref 8–23)
CO2: 24 mmol/L (ref 22–32)
Calcium: 10.4 mg/dL — ABNORMAL HIGH (ref 8.9–10.3)
Chloride: 103 mmol/L (ref 98–111)
Creatinine: 0.58 mg/dL (ref 0.44–1.00)
GFR, Estimated: >60 mL/min (ref >60)
Glucose, Bld: 72 mg/dL (ref 70–99)
Potassium: 3.9 mmol/L (ref 3.5–5.1)
Sodium: 137 mmol/L (ref 135–145)
Total Bilirubin: 0.2 mg/dL (ref 0.0–1.2)
Total Protein: 7.3 g/dL (ref 6.5–8.1)

## 2024-06-04 LAB — CBC WITH DIFFERENTIAL (CANCER CENTER ONLY)
Abs Immature Granulocytes: 0.04 K/uL (ref 0.00–0.07)
Basophils Absolute: 0.1 K/uL (ref 0.0–0.1)
Basophils Relative: 1 %
Eosinophils Absolute: 0.2 K/uL (ref 0.0–0.5)
Eosinophils Relative: 2 %
HCT: 41.5 % (ref 36.0–46.0)
Hemoglobin: 13.8 g/dL (ref 12.0–15.0)
Immature Granulocytes: 0 %
Lymphocytes Relative: 27 %
Lymphs Abs: 2.8 K/uL (ref 0.7–4.0)
MCH: 29.1 pg (ref 26.0–34.0)
MCHC: 33.3 g/dL (ref 30.0–36.0)
MCV: 87.4 fL (ref 80.0–100.0)
Monocytes Absolute: 0.8 K/uL (ref 0.1–1.0)
Monocytes Relative: 8 %
Neutro Abs: 6.5 K/uL (ref 1.7–7.7)
Neutrophils Relative %: 62 %
Platelet Count: 330 K/uL (ref 150–400)
RBC: 4.75 MIL/uL (ref 3.87–5.11)
RDW: 13 % (ref 11.5–15.5)
WBC Count: 10.3 K/uL (ref 4.0–10.5)
nRBC: 0 % (ref 0.0–0.2)

## 2024-06-04 LAB — VITAMIN D 25 HYDROXY (VIT D DEFICIENCY, FRACTURES): Vit D, 25-Hydroxy: 42.08 ng/mL (ref 30–100)

## 2024-06-04 NOTE — Assessment & Plan Note (Signed)
 This is a very pleasant 68 year old female patient with past medical history significant for type 2 diabetes mellitus, hypertension who had an abnormal mammogram back in 2021 with 6 mm breast mass in the left breast at 930 o'clock as well as left abnormal axillary lymph nodes, did not undergo biopsy at that time as recommended who presented with abnormal mammogram again which led to further testing.  Left breast needle core biopsy at this time showed invasive ductal carcinoma, grade 2 along with DCIS, ER +80% strong intensity, PR +100% strong intensity, KI 5% and HER2 negative.   She was presented in the breast [facility] and recommendation was to proceed with surgery upfront followed by adjuvant recommendations based on final pathology.  Final path showed left invasive ductal carcinoma, 13 mm in greatest dimension, Nottingham grade 2, DCIS grade 2, all margins negative, 1 out of 5 axillary lymph nodes with micrometastasis, tumor measuring 1.3 mm. Progs were repeated and issued as an addendum. This is ER moderate- weak staining 80%, PR 100%, Her 2 neg.  Oncotype DX showed a score of 11, there appears to be no benefit from chemotherapy.   Distant recurrence risk at 9 years of 13% with antiestrogen alone.   She is now s/p adjuvant radiation and is on anastrozole .   Assessment and Plan Assessment & Plan Assessment and Plan Assessment & Plan Breast cancer treatment with Anastrozole  She adheres to Anastrozole , experiencing intermittent joint pain likely due to the medication.  Overall this is tolerable, she wants to continue this medication No concerns on exam today Last mammogram neg for malignancy.  Possible allergic reaction to Zometa  Unilateral eye and conjunctival swelling resolved with prednisone eye drops. Reaction is uncommon and unlikely a direct side effect of Zometa .  Given osteoporosis being the reason to use zometa , we will see if her PCP can offer her reclast . If not other options would be try  oral fosamax. Cancel infusion today  RTC in 6 months or sooner as needed.    Amber Stalls MD

## 2024-06-04 NOTE — Progress Notes (Signed)
 [facility] CONSULT NOTE  Patient Care Team: Kelli Drivers, MD as PCP - General (Family Medicine) Kelli Shoulders, MD as Consulting Physician (General Surgery) Kelli Ash, MD as Consulting Physician (Hematology and Oncology) Kelli Rush, MD as Consulting Physician (Radiation Oncology) Kelli [facility]n, Kelli Pickle, NP as Nurse Practitioner (Hematology and Oncology) Kelli Wendelyn BIRCH, RN as Registered Nurse  CHIEF COMPLAINTS/PURPOSE OF CONSULTATION:  Follow up  HISTORY OF PRESENTING ILLNESS:   Kelli Vaughn 68 y.o. female is here because of recent diagnosis of left IDC. I reviewed her records extensively and collaborated the history with the patient.  SUMMARY OF ONCOLOGIC HISTORY: Oncology History Overview Note  Patient was called back for evaluation of LEFT breast calcifications, focal asymmetry, and possible enlarged LEFT axillary lymph node from screening mammogram at [facility] [place]  on 05/20/2021. 08/12/2021, Suspicious mass in the 9:30 o'clock location of the LEFT breast warranting tissue diagnosis. Two LEFT axillary lymph nodes with abnormal morphology. 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 Lymph node biopsy of left axilla negative, lymph node biopsy results were thought to be discordant.   Patient does recollect being asked to schedule biopsy back in 2021 for abnormal mammogram but she was worried or had this misinformation that biopsy can let the breast cancer spread to other parts hence she decided not to proceed with biopsy at that time. She is here with her 2 sons, speaks in Spanish, Spanish interpreter present at the time of my visit.  Son also help with the translation.   Malignant neoplasm of upper-inner quadrant of left breast in female, estrogen receptor positive (HCC)  08/31/2021 Initial Diagnosis   Malignant neoplasm of upper-inner  quadrant of left breast in female, estrogen receptor positive (HCC)   09/01/2021 Cancer Staging   Staging form: Breast, AJCC 8th Edition - Clinical stage from 09/01/2021: Stage Unknown (cT1b, cNX, cM0, G2, ER+, PR+, HER2-) - Signed by Kelli Ash, MD on 09/01/2021 Stage prefix: Initial diagnosis Histologic grading system: 3 grade system    Genetic Testing   [facility] CancerNext Panel was Negative. 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 Report date is 09/20/2021.  The CancerNext gene panel offered by [employer] includes sequencing, rearrangement analysis, and RNA analysis for the following 36 genes:   APC, ATM, AXIN2, BARD1, BMPR1A, BRCA1, BRCA2, BRIP1, CDH1, CDK4, CDKN2A, CHEK2, DICER1, HOXB13, EPCAM, GREM1, MLH1, MSH2, MSH3, MSH6, MUTYH, NBN, NF1, NTHL1, PALB2, PMS2, POLD1, POLE, PTEN, RAD51C, RAD51D, RECQL, SMAD4, SMARCA4, STK11, and TP53.    09/21/2021 Surgery   Patient had left breast lumpectomy with a sentinel lymph node excision on 09/21/2021.  Final pathology showed invasive carcinoma of no special type, 13 mm in greatest dimension, grade 2, DCIS grade 2, all margins negative for invasive carcinoma and DCIS, distance from invasive carcinoma to closest margin is 0.5 mm, posterior margin and distance from DCIS to closest margin, 1 mm posterior margin.  5 axillary lymph nodes removed.  1 axillary lymph node with micrometastasis, metastatic deposit of 0.3 mm in greatest dimension.   Prognostics ER +80% moderate weak staining intensity, PR +100% strong staining intensity and HER2 negative    Oncotype testing   Oncotype DX score of 11.  Distant recurrence risk at 9 years of 13% with antiestrogen therapy.  No benefit from chemotherapy   11/11/2021 - 12/09/2021 Radiation Therapy    Treatment  Dates: 11/11/2021 through 12/09/2021 Site Technique Total Dose (Gy) Dose per Fx (Gy) Completed Fx Beam Energies  Breast, Left: Breast_L 3D 42.56/42.56 2.66 16/16 10X   Breast, Left: Breast_L_Bst 3D 8/8 2 4/4 6X, 10X      12/2021 -  Anti-estrogen oral therapy   Anastrozole     Interval history  Discussed the use of AI scribe software for clinical note transcription with the patient, who gave verbal consent to proceed.  History of Present Illness  Kelli Vaughn is a 68 year old female with breast cancer and osteoporosis who presents with joint pain and concerns about medication side effects.     MEDICAL HISTORY:  Past Medical History:  Diagnosis Date   Breast cancer (HCC)    left breast   Complication of anesthesia    Diabetes mellitus without complication (HCC)    TYPE II   Glaucoma    Hypertension    Personal history of radiation therapy    PONV (postoperative nausea and vomiting)     SURGICAL HISTORY: Past Surgical History:  Procedure Laterality Date   BREAST LUMPECTOMY WITH RADIOACTIVE SEED AND SENTINEL LYMPH NODE BIOPSY Left 09/21/2021   Procedure: LEFT BREAST LUMPECTOMY WITH RADIOACTIVE SEED X2 AND SENTINEL LYMPH NODE BIOPSY;  Surgeon: Kelli Shoulders, MD;  Location: [facility] OR;  Service: General;  Laterality: Left;   CESAREAN SECTION     X2   EYE SURGERY Bilateral 2019   cataract surgery   RADIOACTIVE SEED GUIDED AXILLARY SENTINEL LYMPH NODE Left 09/21/2021   Procedure: RADIOACTIVE SEED GUIDED AXILLARY SENTINEL LYMPH NODE BIOPSY;  Surgeon: Kelli Shoulders, MD;  Location: [facility] OR;  Service: General;  Laterality: Left;    SOCIAL HISTORY: Social History   Socioeconomic History   Marital status: Divorced    Spouse name: Not on file   Number of children: 2   Years of education: Not on file   Highest education level: Some college, no degree  Occupational History   Not on file  Tobacco Use   Smoking status: Former    Types: Cigarettes   Smokeless tobacco: Never  Vaping Use   Vaping status: Never Used  Substance and Sexual Activity   Alcohol use: No   Drug use: No   Sexual activity: Not Currently    Birth  control/protection: Post-menopausal  Other Topics Concern   Not on file  Social History Narrative   Pt was born in [place]   Social [facility]   Financial Resource Strain: Not on file  Food Insecurity: No Food Insecurity (09/14/2023)   Hunger Vital Sign    Worried About Running Out of Food in the Last Year: Never true    Ran Out of Food in the Last Year: Never true  Transportation Needs: No Transportation Needs (09/14/2023)   PRAPARE - Administrator, Civil Service (Medical): No    Lack of Transportation (Non-Medical): No  Physical Activity: Not on file  Stress: Not on file  Social Connections: Not on file  Intimate Partner Violence: Not on file    FAMILY HISTORY: Family History  Problem Relation Age of Onset   Heart disease Mother    Bone cancer Father    Breast cancer Sister        dx. 30s   Stomach cancer Sister        dx. 30s, unsure if second primary or metastasis   Breast cancer Niece     ALLERGIES:  is allergic to insulin.  MEDICATIONS:  Current  Outpatient Medications  Medication Sig Dispense Refill   anastrozole  (ARIMIDEX ) 1 MG tablet Take 1 tablet (1 mg total) by mouth daily. 90 tablet 3   aspirin EC 81 MG tablet Take 81 mg by mouth at bedtime.     cholecalciferol (VITAMIN D3) 25 MCG (1000 UNIT) tablet Take 1,000 Units by mouth daily. (Patient not taking: Reported on 09/14/2023)     gabapentin (NEURONTIN) 300 MG capsule Take 300 mg by mouth daily at 12 noon. (Patient not taking: Reported on 03/28/2023)     glipiZIDE (GLUCOTROL) 10 MG tablet Take 10 mg by mouth daily before breakfast.     hydrochlorothiazide (HYDRODIURIL) 25 MG tablet Take 25 mg by mouth in the morning.     ibuprofen  (ADVIL ,MOTRIN ) 800 MG tablet Take 1,600 mg by mouth every 8 (eight) hours as needed for moderate pain.     insulin glargine (LANTUS) 100 UNIT/ML injection Inject 35 Units into the skin 2 (two) times daily.     latanoprost (XALATAN) 0.005 % ophthalmic solution 1 drop every  morning.     lisinopril (ZESTRIL) 10 MG tablet Take 10 mg by mouth in the morning.     omeprazole (PRILOSEC) 40 MG capsule Take by mouth.     Semaglutide (OZEMPIC, 1 MG/DOSE, [place] [facility]) Inject into the skin. Injection once a week     SitaGLIPtin-MetFORMIN HCl (JANUMET XR) 50-1000 MG TB24 Take 1 tablet by mouth in the morning and at bedtime.     Vitamin D , Ergocalciferol , (DRISDOL ) 1.25 MG (50000 UNIT) CAPS capsule Take 1 capsule (50,000 Units total) by mouth every 7 (seven) days. (Patient not taking: Reported on 09/14/2023) 12 capsule 0   No current facility-administered medications for this visit.     PHYSICAL EXAMINATION: ECOG PERFORMANCE STATUS: 0 - Asymptomatic Physical Exam Constitutional:      Appearance: Normal appearance.  Cardiovascular:     Pulses: Normal pulses.     Heart sounds: Normal heart sounds.  Pulmonary:     Effort: Pulmonary effort is normal.     Breath sounds: Normal breath sounds.  Chest:     Comments: Bilateral breasts inspected. No palpable masses or regional adenopathy Musculoskeletal:     Cervical back: Normal range of motion. No rigidity.  Lymphadenopathy:     Cervical: No cervical adenopathy.  Neurological:     Mental Status: She is alert.      LABORATORY DATA:  I have reviewed the data as listed Lab Results  Component Value Date   WBC 8.3 11/29/2023   HGB 13.3 11/29/2023   HCT 39.0 11/29/2023   MCV 86.7 11/29/2023   PLT 319 11/29/2023   Lab Results  Component Value Date   NA 136 11/29/2023   K 3.7 11/29/2023   CL 103 11/29/2023   CO2 27 11/29/2023    RADIOGRAPHIC STUDIES: I have personally reviewed the radiological reports and agreed with the findings in the report.  ASSESSMENT AND PLAN:  his is a very pleasant 68 year old female patient with past medical history significant for type 2 diabetes mellitus, hypertension who had an abnormal mammogram back in 2021 with 6 mm breast mass in the left breast at 930 o'clock as well as left abnormal  axillary lymph nodes, did not undergo biopsy at that time as recommended who presented with abnormal mammogram again which led to further testing.  Left breast needle core biopsy at this time showed invasive ductal carcinoma, grade 2 along with DCIS, ER +80% strong intensity, PR +100% strong intensity, KI 5% and HER2  negative.   She was presented in the breast [facility] and recommendation was to proceed with surgery upfront followed by adjuvant recommendations based on final pathology.  Final path showed left invasive ductal carcinoma, 13 mm in greatest dimension, Nottingham grade 2, DCIS grade 2, all margins negative, 1 out of 5 axillary lymph nodes with micrometastasis, tumor measuring 1.3 mm. Progs were repeated and issued as an addendum. This is ER moderate- weak staining 80%, PR 100%, Her 2 neg.   Oncotype DX showed a score of 11, there appears to be no benefit from chemotherapy.   Distant recurrence risk at 9 years of 13% with antiestrogen alone.   She is now s/p adjuvant radiation and is on anastrozole .    Assessment and Plan Assessment & Plan Breast cancer treatment with Anastrozole  She adheres to Anastrozole , experiencing intermittent joint pain likely due to the medication. Pain occurs mostly at night but is not constant.  Possible allergic reaction to Zometa  Unilateral eye and conjunctival swelling resolved with prednisone eye drops. Reaction is uncommon and has been rarely reported with Zometa .     Osteoporosis Osteoporosis present, potentially exacerbated by breast cancer treatment.  - Consider alternative treatments such as Prolia or oral bisphosphonates if further infusion reaction happens.

## 2024-06-04 NOTE — Progress Notes (Signed)
 [facility] CONSULT NOTE  Patient Care Team: Kayla Drivers, MD as PCP - General (Family Medicine) Aron Shoulders, MD as Consulting Physician (General Surgery) Loretha Ash, MD as Consulting Physician (Hematology and Oncology) Dewey Rush, MD as Consulting Physician (Radiation Oncology) Crawford, Morna Pickle, NP as Nurse Practitioner (Hematology and Oncology) Starla Wendelyn BIRCH, RN as Registered Nurse  CHIEF COMPLAINTS/PURPOSE OF CONSULTATION:  Follow up  HISTORY OF PRESENTING ILLNESS:   Kelli Vaughn 68 y.o. female is here because of recent diagnosis of left IDC. I reviewed her records extensively and collaborated the history with the patient.  SUMMARY OF ONCOLOGIC HISTORY: Oncology History Overview Note  Patient was called back for evaluation of LEFT breast calcifications, focal asymmetry, and possible enlarged LEFT axillary lymph node from screening mammogram at [facility] [place]  on 05/20/2021. 08/12/2021, Suspicious mass in the 9:30 o'clock location of the LEFT breast warranting tissue diagnosis. Two LEFT axillary lymph nodes with abnormal morphology. 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 Lymph node biopsy of left axilla negative, lymph node biopsy results were thought to be discordant.   Patient does recollect being asked to schedule biopsy back in 2021 for abnormal mammogram but she was worried or had this misinformation that biopsy can let the breast cancer spread to other parts hence she decided not to proceed with biopsy at that time. She is here with her 2 sons, speaks in Spanish, Spanish interpreter present at the time of my visit.  Son also help with the translation.   Malignant neoplasm of upper-inner quadrant of left breast in female, estrogen receptor positive (HCC)  08/31/2021 Initial Diagnosis   Malignant neoplasm of upper-inner  quadrant of left breast in female, estrogen receptor positive (HCC)   09/01/2021 Cancer Staging   Staging form: Breast, AJCC 8th Edition - Clinical stage from 09/01/2021: Stage Unknown (cT1b, cNX, cM0, G2, ER+, PR+, HER2-) - Signed by Loretha Ash, MD on 09/01/2021 Stage prefix: Initial diagnosis Histologic grading system: 3 grade system    Genetic Testing   [facility] CancerNext Panel was Negative. 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 Report date is 09/20/2021.  The CancerNext gene panel offered by [employer] includes sequencing, rearrangement analysis, and RNA analysis for the following 36 genes:   APC, ATM, AXIN2, BARD1, BMPR1A, BRCA1, BRCA2, BRIP1, CDH1, CDK4, CDKN2A, CHEK2, DICER1, HOXB13, EPCAM, GREM1, MLH1, MSH2, MSH3, MSH6, MUTYH, NBN, NF1, NTHL1, PALB2, PMS2, POLD1, POLE, PTEN, RAD51C, RAD51D, RECQL, SMAD4, SMARCA4, STK11, and TP53.    09/21/2021 Surgery   Patient had left breast lumpectomy with a sentinel lymph node excision on 09/21/2021.  Final pathology showed invasive carcinoma of no special type, 13 mm in greatest dimension, grade 2, DCIS grade 2, all margins negative for invasive carcinoma and DCIS, distance from invasive carcinoma to closest margin is 0.5 mm, posterior margin and distance from DCIS to closest margin, 1 mm posterior margin.  5 axillary lymph nodes removed.  1 axillary lymph node with micrometastasis, metastatic deposit of 0.3 mm in greatest dimension.   Prognostics ER +80% moderate weak staining intensity, PR +100% strong staining intensity and HER2 negative    Oncotype testing   Oncotype DX score of 11.  Distant recurrence risk at 9 years of 13% with antiestrogen therapy.  No benefit from chemotherapy   11/11/2021 - 12/09/2021 Radiation Therapy    Treatment  Dates: 11/11/2021 through 12/09/2021 Site Technique Total Dose (Gy) Dose per Fx (Gy) Completed Fx Beam Energies  Breast, Left: Breast_L 3D 42.56/42.56 2.66 16/16 10X   Breast, Left: Breast_L_Bst 3D 8/8 2 4/4 6X, 10X      12/2021 -  Anti-estrogen oral therapy   Anastrozole     Interval history  Discussed the use of AI scribe software for clinical note transcription with the patient, who gave verbal consent to proceed.  History of Present Illness  Kelli Vaughn is a 68 year old female with osteoporosis who presents for follow-up regarding her osteoporosis treatment.  She experiences ongoing shoulder pain, particularly at night, described as 'ugly' and feeling like it is 'inside the bone'. The pain affects both shoulders and arms. She uses her hands frequently during the day. The shoulder pain began after starting anastrozole , which she continues to take without side effects.  She has osteoporosis and has been receiving zometa  every 6 months. She experienced eye swelling after the infusion, though it was less severe than the first time.  She is experiencing weight loss, which she attributes to her diabetes medication, Ozempic (semaglutide). She has lost a significant amount of weight, dropping from approximately 200 pounds.   MEDICAL HISTORY:  Past Medical History:  Diagnosis Date   Breast cancer (HCC)    left breast   Complication of anesthesia    Diabetes mellitus without complication (HCC)    TYPE II   Glaucoma    Hypertension    Personal history of radiation therapy    PONV (postoperative nausea and vomiting)     SURGICAL HISTORY: Past Surgical History:  Procedure Laterality Date   BREAST LUMPECTOMY WITH RADIOACTIVE SEED AND SENTINEL LYMPH NODE BIOPSY Left 09/21/2021   Procedure: LEFT BREAST LUMPECTOMY WITH RADIOACTIVE SEED X2 AND SENTINEL LYMPH NODE BIOPSY;  Surgeon: Aron Shoulders, MD;  Location: [facility] OR;  Service: General;  Laterality: Left;   CESAREAN SECTION     X2   EYE SURGERY Bilateral 2019   cataract surgery   RADIOACTIVE SEED GUIDED AXILLARY SENTINEL LYMPH NODE Left 09/21/2021   Procedure: RADIOACTIVE SEED GUIDED  AXILLARY SENTINEL LYMPH NODE BIOPSY;  Surgeon: Aron Shoulders, MD;  Location: [facility] OR;  Service: General;  Laterality: Left;    SOCIAL HISTORY: Social History   Socioeconomic History   Marital status: Divorced    Spouse name: Not on file   Number of children: 2   Years of education: Not on file   Highest education level: Some college, no degree  Occupational History   Not on file  Tobacco Use   Smoking status: Former    Types: Cigarettes   Smokeless tobacco: Never  Vaping Use   Vaping status: Never Used  Substance and Sexual Activity   Alcohol use: No   Drug use: No   Sexual activity: Not Currently    Birth control/protection: Post-menopausal  Other Topics Concern   Not on file  Social History Narrative   Pt was born in [place]   Social Drivers of Health   Financial Resource Strain: Not on file  Food Insecurity: No Food Insecurity (09/14/2023)   Hunger Vital Sign    Worried About Running Out of Food in the Last Year: Never true    Ran Out of Food in the Last Year: Never true  Transportation Needs: No Transportation Needs (09/14/2023)   PRAPARE - Administrator, Civil Service (Medical): No    Lack of Transportation (Non-Medical): No  Physical Activity: Not  on file  Stress: Not on file  Social Connections: Not on file  Intimate Partner Violence: Not on file    FAMILY HISTORY: Family History  Problem Relation Age of Onset   Heart disease Mother    Bone cancer Father    Breast cancer Sister        dx. 30s   Stomach cancer Sister        dx. 30s, unsure if second primary or metastasis   Breast cancer Niece     ALLERGIES:  is allergic to insulin.  MEDICATIONS:  Current Outpatient Medications  Medication Sig Dispense Refill   anastrozole  (ARIMIDEX ) 1 MG tablet Take 1 tablet (1 mg total) by mouth daily. 90 tablet 3   aspirin EC 81 MG tablet Take 81 mg by mouth at bedtime.     cholecalciferol (VITAMIN D3) 25 MCG (1000 UNIT) tablet Take 1,000 Units by mouth  daily. (Patient not taking: Reported on 09/14/2023)     gabapentin (NEURONTIN) 300 MG capsule Take 300 mg by mouth daily at 12 noon. (Patient not taking: Reported on 03/28/2023)     glipiZIDE (GLUCOTROL) 10 MG tablet Take 10 mg by mouth daily before breakfast.     hydrochlorothiazide (HYDRODIURIL) 25 MG tablet Take 25 mg by mouth in the morning.     ibuprofen  (ADVIL ,MOTRIN ) 800 MG tablet Take 1,600 mg by mouth every 8 (eight) hours as needed for moderate pain.     insulin glargine (LANTUS) 100 UNIT/ML injection Inject 35 Units into the skin 2 (two) times daily.     latanoprost (XALATAN) 0.005 % ophthalmic solution 1 drop every morning.     lisinopril (ZESTRIL) 10 MG tablet Take 10 mg by mouth in the morning.     omeprazole (PRILOSEC) 40 MG capsule Take by mouth.     Semaglutide (OZEMPIC, 1 MG/DOSE, [place]) Inject into the skin. Injection once a week     SitaGLIPtin-MetFORMIN HCl (JANUMET XR) 50-1000 MG TB24 Take 1 tablet by mouth in the morning and at bedtime.     Vitamin D , Ergocalciferol , (DRISDOL ) 1.25 MG (50000 UNIT) CAPS capsule Take 1 capsule (50,000 Units total) by mouth every 7 (seven) days. (Patient not taking: Reported on 09/14/2023) 12 capsule 0   No current facility-administered medications for this visit.     PHYSICAL EXAMINATION: ECOG PERFORMANCE STATUS: 0 - Asymptomatic Physical Exam Constitutional:      Appearance: Normal appearance.  Cardiovascular:     Pulses: Normal pulses.     Heart sounds: Normal heart sounds.  Pulmonary:     Effort: Pulmonary effort is normal.     Breath sounds: Normal breath sounds.  Chest:     Comments: Bilateral breasts inspected. No palpable masses or regional adenopathy Musculoskeletal:     Cervical back: Normal range of motion. No rigidity.  Lymphadenopathy:     Cervical: No cervical adenopathy.  Neurological:     Mental Status: She is alert.      LABORATORY DATA:  I have reviewed the data as listed Lab Results  Component Value Date    WBC 10.3 06/04/2024   HGB 13.8 06/04/2024   HCT 41.5 06/04/2024   MCV 87.4 06/04/2024   PLT 330 06/04/2024   Lab Results  Component Value Date   NA 137 06/04/2024   K 3.9 06/04/2024   CL 103 06/04/2024   CO2 24 06/04/2024    RADIOGRAPHIC STUDIES: I have personally reviewed the radiological reports and agreed with the findings in the report.  ASSESSMENT AND PLAN:  Malignant neoplasm of upper-inner quadrant of left breast in female, estrogen receptor positive (HCC) This is a very pleasant 68 year old female patient with past medical history significant for type 2 diabetes mellitus, hypertension who had an abnormal mammogram back in 2021 with 6 mm breast mass in the left breast at 930 o'clock as well as left abnormal axillary lymph nodes, did not undergo biopsy at that time as recommended who presented with abnormal mammogram again which led to further testing.  Left breast needle core biopsy at this time showed invasive ductal carcinoma, grade 2 along with DCIS, ER +80% strong intensity, PR +100% strong intensity, KI 5% and HER2 negative.   She was presented in the breast [facility] and recommendation was to proceed with surgery upfront followed by adjuvant recommendations based on final pathology.  Final path showed left invasive ductal carcinoma, 13 mm in greatest dimension, Nottingham grade 2, DCIS grade 2, all margins negative, 1 out of 5 axillary lymph nodes with micrometastasis, tumor measuring 1.3 mm. Progs were repeated and issued as an addendum. This is ER moderate- weak staining 80%, PR 100%, Her 2 neg.  Oncotype DX showed a score of 11, there appears to be no benefit from chemotherapy.   Distant recurrence risk at 9 years of 13% with antiestrogen alone.   She is now s/p adjuvant radiation and is on anastrozole .   Assessment and Plan Assessment & Plan Assessment and Plan Assessment & Plan Breast cancer treatment with Anastrozole  She adheres to Anastrozole , experiencing intermittent  joint pain likely due to the medication.  Overall this is tolerable, she wants to continue this medication No concerns on exam today Last mammogram neg for malignancy.  Possible allergic reaction to Zometa  Unilateral eye and conjunctival swelling resolved with prednisone eye drops. Reaction is uncommon and unlikely a direct side effect of Zometa .  Given osteoporosis being the reason to use zometa , we will see if her PCP can offer her reclast . If not other options would be try oral fosamax. Cancel infusion today  RTC in 6 months or sooner as needed.    Amber Stalls MD   Time spent: 30 min including coordination with ophthalmology office, history, review of records, physical exam, counseling and coordination of care.

## 2024-08-05 ENCOUNTER — Ambulatory Visit: Payer: Self-pay

## 2024-08-12 ENCOUNTER — Ambulatory Visit: Payer: Self-pay

## 2024-08-26 ENCOUNTER — Ambulatory Visit: Payer: Self-pay

## 2024-12-03 ENCOUNTER — Inpatient Hospital Stay: Payer: Self-pay | Attending: Hematology and Oncology

## 2024-12-03 ENCOUNTER — Inpatient Hospital Stay: Payer: Self-pay | Admitting: Hematology and Oncology
